# Patient Record
Sex: Male | Born: 1960
Health system: Southern US, Community
[De-identification: ages and names within clinical notes are randomized; demographics above are authoritative.]

## PROBLEM LIST (undated history)

## (undated) DIAGNOSIS — R569 Unspecified convulsions: Secondary | ICD-10-CM

## (undated) DIAGNOSIS — I10 Essential (primary) hypertension: Secondary | ICD-10-CM

## (undated) DIAGNOSIS — F319 Bipolar disorder, unspecified: Secondary | ICD-10-CM

## (undated) DIAGNOSIS — F1010 Alcohol abuse, uncomplicated: Secondary | ICD-10-CM

---

## 2000-06-17 ENCOUNTER — Emergency Department (HOSPITAL_COMMUNITY): Admission: EM | Admit: 2000-06-17 | Discharge: 2000-06-17 | Payer: Self-pay

## 2000-06-17 ENCOUNTER — Encounter: Payer: Self-pay | Admitting: Emergency Medicine

## 2003-01-10 ENCOUNTER — Encounter: Payer: Self-pay | Admitting: Emergency Medicine

## 2003-01-10 ENCOUNTER — Emergency Department (HOSPITAL_COMMUNITY): Admission: EM | Admit: 2003-01-10 | Discharge: 2003-01-10 | Payer: Self-pay | Admitting: Emergency Medicine

## 2003-05-10 ENCOUNTER — Emergency Department (HOSPITAL_COMMUNITY): Admission: EM | Admit: 2003-05-10 | Discharge: 2003-05-10 | Payer: Self-pay | Admitting: Emergency Medicine

## 2003-06-30 ENCOUNTER — Emergency Department (HOSPITAL_COMMUNITY): Admission: EM | Admit: 2003-06-30 | Discharge: 2003-06-30 | Payer: Self-pay | Admitting: Emergency Medicine

## 2006-12-23 ENCOUNTER — Emergency Department (HOSPITAL_COMMUNITY): Admission: AC | Admit: 2006-12-23 | Discharge: 2006-12-24 | Payer: Self-pay
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
## 2007-11-27 ENCOUNTER — Emergency Department (HOSPITAL_COMMUNITY): Admission: EM | Admit: 2007-11-27 | Discharge: 2007-11-27 | Payer: Self-pay | Admitting: Emergency Medicine

## 2010-06-21 ENCOUNTER — Emergency Department (HOSPITAL_COMMUNITY)
Admission: EM | Admit: 2010-06-21 | Discharge: 2010-06-21 | Payer: Self-pay | Source: Home / Self Care | Admitting: Emergency Medicine

## 2011-05-10 LAB — RAPID URINE DRUG SCREEN, HOSP PERFORMED
Amphetamines: NOT DETECTED
Barbiturates: NOT DETECTED
Benzodiazepines: NOT DETECTED
Cocaine: NOT DETECTED
Opiates: NOT DETECTED
Tetrahydrocannabinol: NOT DETECTED

## 2011-05-10 LAB — I-STAT 8, (EC8 V) (CONVERTED LAB)
Acid-base deficit: 1
BUN: 8
Bicarbonate: 22.9
Chloride: 103
Glucose, Bld: 85
HCT: 45
Hemoglobin: 15.3
Operator id: 151321
Potassium: 3.7
Sodium: 136
TCO2: 24
pCO2, Ven: 35.6 — ABNORMAL LOW
pH, Ven: 7.415 — ABNORMAL HIGH

## 2011-05-10 LAB — POCT I-STAT CREATININE
Creatinine, Ser: 1.2
Operator id: 151321

## 2011-05-10 LAB — ETHANOL: Alcohol, Ethyl (B): 240 — ABNORMAL HIGH

## 2016-05-08 ENCOUNTER — Encounter (HOSPITAL_COMMUNITY): Payer: Self-pay | Admitting: Emergency Medicine

## 2016-05-08 ENCOUNTER — Emergency Department (HOSPITAL_COMMUNITY)
Admission: EM | Admit: 2016-05-08 | Discharge: 2016-05-08 | Disposition: A | Discharge status: Home / Self Care | Payer: Self-pay | Attending: Emergency Medicine | Admitting: Emergency Medicine

## 2016-05-08 DIAGNOSIS — Y939 Activity, unspecified: Secondary | ICD-10-CM | POA: Insufficient documentation

## 2016-05-08 DIAGNOSIS — F17210 Nicotine dependence, cigarettes, uncomplicated: Secondary | ICD-10-CM | POA: Insufficient documentation

## 2016-05-08 DIAGNOSIS — Y929 Unspecified place or not applicable: Secondary | ICD-10-CM | POA: Insufficient documentation

## 2016-05-08 DIAGNOSIS — S46219A Strain of muscle, fascia and tendon of other parts of biceps, unspecified arm, initial encounter: Secondary | ICD-10-CM

## 2016-05-08 DIAGNOSIS — I10 Essential (primary) hypertension: Secondary | ICD-10-CM | POA: Insufficient documentation

## 2016-05-08 DIAGNOSIS — Y999 Unspecified external cause status: Secondary | ICD-10-CM | POA: Insufficient documentation

## 2016-05-08 DIAGNOSIS — X500XXA Overexertion from strenuous movement or load, initial encounter: Secondary | ICD-10-CM | POA: Insufficient documentation

## 2016-05-08 DIAGNOSIS — S46211A Strain of muscle, fascia and tendon of other parts of biceps, right arm, initial encounter: Secondary | ICD-10-CM | POA: Insufficient documentation

## 2016-05-08 HISTORY — DX: Unspecified convulsions: R56.9

## 2016-05-08 HISTORY — DX: Essential (primary) hypertension: I10

## 2016-05-08 LAB — CBC WITH DIFFERENTIAL/PLATELET
Basophils Absolute: 0 10*3/uL (ref 0.0–0.1)
Basophils Relative: 0 %
Eosinophils Absolute: 0.1 10*3/uL (ref 0.0–0.7)
Eosinophils Relative: 1 %
HCT: 44.6 % (ref 39.0–52.0)
Hemoglobin: 15.7 g/dL (ref 13.0–17.0)
Lymphocytes Relative: 20 %
Lymphs Abs: 2.6 10*3/uL (ref 0.7–4.0)
MCH: 31.4 pg (ref 26.0–34.0)
MCHC: 35.2 g/dL (ref 30.0–36.0)
MCV: 89.2 fL (ref 78.0–100.0)
Monocytes Absolute: 0.8 10*3/uL (ref 0.1–1.0)
Monocytes Relative: 6 %
Neutro Abs: 9.2 10*3/uL — ABNORMAL HIGH (ref 1.7–7.7)
Neutrophils Relative %: 73 %
Platelets: 245 10*3/uL (ref 150–400)
RBC: 5 MIL/uL (ref 4.22–5.81)
RDW: 11.8 % (ref 11.5–15.5)
WBC: 12.6 10*3/uL — ABNORMAL HIGH (ref 4.0–10.5)

## 2016-05-08 LAB — BASIC METABOLIC PANEL
Anion gap: 8 (ref 5–15)
BUN: 9 mg/dL (ref 6–20)
CO2: 27 mmol/L (ref 22–32)
Calcium: 9.6 mg/dL (ref 8.9–10.3)
Chloride: 102 mmol/L (ref 101–111)
Creatinine, Ser: 1.12 mg/dL (ref 0.61–1.24)
GFR calc Af Amer: >60 mL/min (ref >60)
GFR calc non Af Amer: >60 mL/min (ref >60)
Glucose, Bld: 103 mg/dL — ABNORMAL HIGH (ref 65–99)
Potassium: 3.9 mmol/L (ref 3.5–5.1)
Sodium: 137 mmol/L (ref 135–145)

## 2016-05-08 MED ORDER — OXYCODONE-ACETAMINOPHEN 5-325 MG PO TABS: 1.0000 | ORAL_TABLET | Freq: Four times a day (QID) | ORAL | 0 refills | Status: DC | PRN

## 2016-05-08 MED ORDER — FENTANYL CITRATE (PF) 100 MCG/2ML IJ SOLN
50.0000 ug | Freq: Once | INTRAMUSCULAR | Status: AC
  Administered 2016-05-08: 50 ug via INTRAVENOUS
  Filled 2016-05-08: qty 2

## 2016-05-08 NOTE — ED Provider Notes (Signed)
[facility]EMERGENCY DEPT Provider Note   CSN: [id number] Arrival date & time: 05/08/16  1033     History   Chief Complaint Chief Complaint  Patient presents with  . R arm pain    HPI Jerry Humphrey is a 55 y.o. male.  55 year old male reporting onset of right upper arm pain about 2 weeks ago while lifting/moving chunks of concrete. He states he heard a "pop", but was able to continue to use his arm. Yesterday, while moving a ladder, he developed worsening pain, and now is unable to flex/extend his arm.   The history is provided by the patient. No language interpreter was used.  Arm Injury   This is a new problem. The current episode started more than 1 week ago. The problem occurs constantly. The problem has been gradually worsening. The pain is present in the right arm. The quality of the pain is described as aching and constant. The pain is moderate. Associated symptoms include limited range of motion. The symptoms are aggravated by activity. He has tried rest for the symptoms.    Past Medical History:  Diagnosis Date  . Hypertension   . Seizures (HCC)     There are no active problems to display for this patient.   History reviewed. No pertinent surgical history.     Home Medications    Prior to Admission medications   Not on File    Family History No family history on file.  Social History Social History  Substance Use Topics  . Smoking status: Current Every Day Smoker    Packs/day: 1.00    Types: Cigarettes  . Smokeless tobacco: Not on file  . Alcohol use Yes     Comment: socially     Allergies   Codeine   Review of Systems Review of Systems  Musculoskeletal: Positive for myalgias.  All other systems reviewed and are negative.    Physical Exam Updated Vital Signs BP (!) 160/115 (BP Location: Left Arm)   Pulse 78   Temp 98 F (36.7 C) (Oral)   Resp 18   SpO2 99%   Physical Exam  Constitutional: He is oriented to person, place, and  time. He appears well-developed and well-nourished.  HENT:  Head: Normocephalic.  Eyes: Conjunctivae are normal.  Neck: Neck supple.  Cardiovascular: Normal rate and regular rhythm.   Pulmonary/Chest: Effort normal and breath sounds normal.  Abdominal: Soft. Bowel sounds are normal.  Musculoskeletal: He exhibits tenderness.       Right elbow: He exhibits decreased range of motion and swelling. Tenderness found.       Arms: Neurological: He is alert and oriented to person, place, and time.  Skin: Skin is warm and dry.  Psychiatric: He has a normal mood and affect.  Nursing note and vitals reviewed.    ED Treatments / Results  Labs (all labs ordered are listed, but only abnormal results are displayed) Labs Reviewed  CBC WITH DIFFERENTIAL/PLATELET  BASIC METABOLIC PANEL    EKG  EKG Interpretation None       Radiology No results found.  Procedures Procedures (including critical care time)  Medications Ordered in ED Medications  fentaNYL (SUBLIMAZE) injection 50 mcg (50 mcg Intravenous Given 05/08/16 1257)     Initial Impression / Assessment and Plan / ED Course  I have reviewed the triage vital signs and the nursing notes.  Pertinent labs & imaging results that were available during my care of the patient were reviewed by me and  considered in my medical decision making (see chart for details).  Clinical Course  Patient with biceps tendon tear. Discussed with orthopedics, who will follow-up with patient in the office. Splint, pain medication. Care instructions provided. Return precautions discussed.    Final Clinical Impressions(s) / ED Diagnoses   Final diagnoses:  Biceps tendon tear    New Prescriptions Discharge Medication List as of 05/08/2016  2:54 PM    START taking these medications   Details  oxyCODONE-acetaminophen (PERCOCET/ROXICET) 5-325 MG tablet Take 1 tablet by mouth every 6 (six) hours as needed for severe pain., Starting Tue 05/08/2016,  Print         Felicie Morn, NP 05/08/16 2317    Benjiman Core, MD 05/10/16 [id number]
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
## 2016-05-08 NOTE — ED Notes (Signed)
Papers reviewed with prescription and sling applied. Pt. verbalizes understanding of follow up instructions and medications and expresses understanding of sling

## 2016-05-09 ENCOUNTER — Telehealth (HOSPITAL_BASED_OUTPATIENT_CLINIC_OR_DEPARTMENT_OTHER): Payer: Self-pay | Admitting: Emergency Medicine

## 2019-01-18 ENCOUNTER — Emergency Department (HOSPITAL_COMMUNITY)
Admission: EM | Admit: 2019-01-18 | Discharge: 2019-01-18 | Disposition: A | Discharge status: Home / Self Care | Payer: Self-pay | Attending: Emergency Medicine | Admitting: Emergency Medicine

## 2019-01-18 ENCOUNTER — Emergency Department (HOSPITAL_COMMUNITY): Payer: Self-pay

## 2019-01-18 ENCOUNTER — Other Ambulatory Visit: Payer: Self-pay

## 2019-01-18 ENCOUNTER — Encounter (HOSPITAL_COMMUNITY): Payer: Self-pay

## 2019-01-18 DIAGNOSIS — S0281XA Fracture of other specified skull and facial bones, right side, initial encounter for closed fracture: Secondary | ICD-10-CM | POA: Insufficient documentation

## 2019-01-18 DIAGNOSIS — Y999 Unspecified external cause status: Secondary | ICD-10-CM | POA: Insufficient documentation

## 2019-01-18 DIAGNOSIS — S0240EA Zygomatic fracture, right side, initial encounter for closed fracture: Secondary | ICD-10-CM | POA: Insufficient documentation

## 2019-01-18 DIAGNOSIS — S02402A Zygomatic fracture, unspecified, initial encounter for closed fracture: Secondary | ICD-10-CM

## 2019-01-18 DIAGNOSIS — Y9389 Activity, other specified: Secondary | ICD-10-CM | POA: Insufficient documentation

## 2019-01-18 DIAGNOSIS — S0181XA Laceration without foreign body of other part of head, initial encounter: Secondary | ICD-10-CM | POA: Insufficient documentation

## 2019-01-18 DIAGNOSIS — F1099 Alcohol use, unspecified with unspecified alcohol-induced disorder: Secondary | ICD-10-CM | POA: Insufficient documentation

## 2019-01-18 DIAGNOSIS — F17210 Nicotine dependence, cigarettes, uncomplicated: Secondary | ICD-10-CM | POA: Insufficient documentation

## 2019-01-18 DIAGNOSIS — I10 Essential (primary) hypertension: Secondary | ICD-10-CM | POA: Insufficient documentation

## 2019-01-18 DIAGNOSIS — S0240CA Maxillary fracture, right side, initial encounter for closed fracture: Secondary | ICD-10-CM | POA: Insufficient documentation

## 2019-01-18 DIAGNOSIS — Y929 Unspecified place or not applicable: Secondary | ICD-10-CM | POA: Insufficient documentation

## 2019-01-18 DIAGNOSIS — Z79899 Other long term (current) drug therapy: Secondary | ICD-10-CM | POA: Insufficient documentation

## 2019-01-18 DIAGNOSIS — Z23 Encounter for immunization: Secondary | ICD-10-CM | POA: Insufficient documentation
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
## 2019-01-18 MED ORDER — TETANUS-DIPHTH-ACELL PERTUSSIS 5-2.5-18.5 LF-MCG/0.5 IM SUSP
0.5000 mL | Freq: Once | INTRAMUSCULAR | Status: AC
  Administered 2019-01-18: 08:00:00 0.5 mL via INTRAMUSCULAR
  Filled 2019-01-18: qty 0.5

## 2019-01-18 MED ORDER — TETRACAINE HCL 0.5 % OP SOLN
2.0000 [drp] | Freq: Once | OPHTHALMIC | Status: AC
  Administered 2019-01-18: 2 [drp] via OPHTHALMIC
  Filled 2019-01-18: qty 4

## 2019-01-18 MED ORDER — HYDROCODONE-ACETAMINOPHEN 5-325 MG PO TABS: 1.0000 | ORAL_TABLET | ORAL | 0 refills | Status: DC | PRN

## 2019-01-18 MED ORDER — FLUORESCEIN SODIUM 1 MG OP STRP
1.0000 | ORAL_STRIP | Freq: Once | OPHTHALMIC | Status: AC
  Administered 2019-01-18: 1 via OPHTHALMIC
  Filled 2019-01-18: qty 1

## 2019-01-18 MED ORDER — LIDOCAINE-EPINEPHRINE (PF) 2 %-1:200000 IJ SOLN
10.0000 mL | Freq: Once | INTRAMUSCULAR | Status: AC
  Administered 2019-01-18: 10 mL
  Filled 2019-01-18: qty 20

## 2019-01-18 NOTE — ED Notes (Signed)
Patient complaining of dizziness while talking to this RN. States he cannot see very well out of his left eye. Contacted his brother and he said he can pick him up in about an hour.

## 2019-01-18 NOTE — ED Notes (Signed)
Patient transported to CT 

## 2019-01-18 NOTE — ED Provider Notes (Signed)
..  Laceration Repair  Date/Time: 01/18/2019 7:58 AM Performed by: Margarita Mail, PA-C Authorized by: Margarita Mail, PA-C   Consent:    Consent obtained:  Verbal   Consent given by:  Patient   Risks discussed:  Infection, need for additional repair, pain, poor cosmetic result and poor wound healing   Alternatives discussed:  No treatment and delayed treatment Universal protocol:    Procedure explained and questions answered to patient or proxy's satisfaction: yes     Relevant documents present and verified: yes     Test results available and properly labeled: yes     Imaging studies available: yes     Required blood products, implants, devices, and special equipment available: yes     Site/side marked: yes     Immediately prior to procedure, a time out was called: yes     Patient identity confirmed:  Verbally with patient Anesthesia (see MAR for exact dosages):    Anesthesia method:  Local infiltration   Local anesthetic:  Lidocaine 1% WITH epi Laceration details:    Location:  Face   Face location:  Forehead   Length (cm):  3   Depth (mm):  5 Repair type:    Repair type:  Simple Pre-procedure details:    Preparation:  Patient was prepped and draped in usual sterile fashion Exploration:    Hemostasis achieved with:  Epinephrine   Wound exploration: wound explored through full range of motion and entire depth of wound probed and visualized     Contaminated: no   Treatment:    Area cleansed with:  Betadine   Amount of cleaning:  Standard   Irrigation solution:  Sterile saline Skin repair:    Repair method:  Sutures   Suture size:  4-0   Suture material:  Chromic gut   Suture technique:  Simple interrupted   Number of sutures:  2 Approximation:    Approximation:  Close Post-procedure details:    Dressing:  Open (no dressing)   Patient tolerance of procedure:  Tolerated well, no immediate complications      Margarita Mail, PA-C 01/18/19 0800    Isla Pence, MD 01/18/19 [phone]

## 2019-01-18 NOTE — ED Notes (Signed)
Pt. Is in bed at this time resting.
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
## 2019-01-30 ENCOUNTER — Inpatient Hospital Stay (HOSPITAL_COMMUNITY)
Admission: EM | Admit: 2019-01-30 | Discharge: 2019-02-16 | DRG: 494 | Disposition: A | Discharge status: Home Health | Payer: Self-pay | Attending: Internal Medicine | Admitting: Internal Medicine

## 2019-01-30 ENCOUNTER — Emergency Department (HOSPITAL_COMMUNITY): Payer: Self-pay

## 2019-01-30 ENCOUNTER — Other Ambulatory Visit: Payer: Self-pay

## 2019-01-30 ENCOUNTER — Encounter (HOSPITAL_COMMUNITY): Payer: Self-pay | Admitting: Emergency Medicine

## 2019-01-30 DIAGNOSIS — F1010 Alcohol abuse, uncomplicated: Secondary | ICD-10-CM | POA: Diagnosis present

## 2019-01-30 DIAGNOSIS — F17200 Nicotine dependence, unspecified, uncomplicated: Secondary | ICD-10-CM | POA: Diagnosis present

## 2019-01-30 DIAGNOSIS — R0789 Other chest pain: Secondary | ICD-10-CM

## 2019-01-30 DIAGNOSIS — S82409A Unspecified fracture of shaft of unspecified fibula, initial encounter for closed fracture: Secondary | ICD-10-CM | POA: Diagnosis present

## 2019-01-30 DIAGNOSIS — S82209A Unspecified fracture of shaft of unspecified tibia, initial encounter for closed fracture: Secondary | ICD-10-CM | POA: Diagnosis present

## 2019-01-30 DIAGNOSIS — F10229 Alcohol dependence with intoxication, unspecified: Secondary | ICD-10-CM | POA: Diagnosis present

## 2019-01-30 DIAGNOSIS — R079 Chest pain, unspecified: Secondary | ICD-10-CM | POA: Diagnosis present

## 2019-01-30 DIAGNOSIS — K219 Gastro-esophageal reflux disease without esophagitis: Secondary | ICD-10-CM | POA: Diagnosis present

## 2019-01-30 DIAGNOSIS — D72828 Other elevated white blood cell count: Secondary | ICD-10-CM | POA: Diagnosis present

## 2019-01-30 DIAGNOSIS — G40909 Epilepsy, unspecified, not intractable, without status epilepticus: Secondary | ICD-10-CM

## 2019-01-30 DIAGNOSIS — Z590 Homelessness: Secondary | ICD-10-CM

## 2019-01-30 DIAGNOSIS — S82201A Unspecified fracture of shaft of right tibia, initial encounter for closed fracture: Secondary | ICD-10-CM | POA: Diagnosis present

## 2019-01-30 DIAGNOSIS — S82301A Unspecified fracture of lower end of right tibia, initial encounter for closed fracture: Principal | ICD-10-CM

## 2019-01-30 DIAGNOSIS — I10 Essential (primary) hypertension: Secondary | ICD-10-CM | POA: Diagnosis present

## 2019-01-30 DIAGNOSIS — W010XXA Fall on same level from slipping, tripping and stumbling without subsequent striking against object, initial encounter: Secondary | ICD-10-CM | POA: Diagnosis present

## 2019-01-30 DIAGNOSIS — E876 Hypokalemia: Secondary | ICD-10-CM | POA: Diagnosis present

## 2019-01-30 DIAGNOSIS — F17210 Nicotine dependence, cigarettes, uncomplicated: Secondary | ICD-10-CM | POA: Diagnosis present

## 2019-01-30 DIAGNOSIS — S82401A Unspecified fracture of shaft of right fibula, initial encounter for closed fracture: Secondary | ICD-10-CM | POA: Diagnosis present

## 2019-01-30 DIAGNOSIS — Z20828 Contact with and (suspected) exposure to other viral communicable diseases: Secondary | ICD-10-CM | POA: Diagnosis present

## 2019-01-30 DIAGNOSIS — Z720 Tobacco use: Secondary | ICD-10-CM

## 2019-01-30 DIAGNOSIS — Y9301 Activity, walking, marching and hiking: Secondary | ICD-10-CM | POA: Diagnosis present

## 2019-01-30 DIAGNOSIS — D72829 Elevated white blood cell count, unspecified: Secondary | ICD-10-CM | POA: Diagnosis present

## 2019-01-30 DIAGNOSIS — S82831A Other fracture of upper and lower end of right fibula, initial encounter for closed fracture: Secondary | ICD-10-CM | POA: Diagnosis present

## 2019-01-30 DIAGNOSIS — R509 Fever, unspecified: Secondary | ICD-10-CM

## 2019-01-30 DIAGNOSIS — Z419 Encounter for procedure for purposes other than remedying health state, unspecified: Secondary | ICD-10-CM

## 2019-01-30 DIAGNOSIS — Z8673 Personal history of transient ischemic attack (TIA), and cerebral infarction without residual deficits: Secondary | ICD-10-CM

## 2019-01-30 DIAGNOSIS — W19XXXA Unspecified fall, initial encounter: Secondary | ICD-10-CM

## 2019-01-30 LAB — CBC WITH DIFFERENTIAL/PLATELET
Abs Immature Granulocytes: 0.08 10*3/uL — ABNORMAL HIGH (ref 0.00–0.07)
Basophils Absolute: 0.1 10*3/uL (ref 0.0–0.1)
Basophils Relative: 0 %
Eosinophils Absolute: 0 10*3/uL (ref 0.0–0.5)
Eosinophils Relative: 0 %
HCT: 45.2 % (ref 39.0–52.0)
Hemoglobin: 15.1 g/dL (ref 13.0–17.0)
Immature Granulocytes: 1 %
Lymphocytes Relative: 18 %
Lymphs Abs: 2.5 10*3/uL (ref 0.7–4.0)
MCH: 31.3 pg (ref 26.0–34.0)
MCHC: 33.4 g/dL (ref 30.0–36.0)
MCV: 93.8 fL (ref 80.0–100.0)
Monocytes Absolute: 0.7 10*3/uL (ref 0.1–1.0)
Monocytes Relative: 5 %
Neutro Abs: 11 10*3/uL — ABNORMAL HIGH (ref 1.7–7.7)
Neutrophils Relative %: 76 %
Platelets: 297 10*3/uL (ref 150–400)
RBC: 4.82 MIL/uL (ref 4.22–5.81)
RDW: 12.4 % (ref 11.5–15.5)
WBC: 14.5 10*3/uL — ABNORMAL HIGH (ref 4.0–10.5)
nRBC: 0 % (ref 0.0–0.2)

## 2019-01-30 LAB — COMPREHENSIVE METABOLIC PANEL
ALT: 125 U/L — ABNORMAL HIGH (ref 0–44)
AST: 107 U/L — ABNORMAL HIGH (ref 15–41)
Albumin: 4.7 g/dL (ref 3.5–5.0)
Alkaline Phosphatase: 68 U/L (ref 38–126)
Anion gap: 13 (ref 5–15)
BUN: 13 mg/dL (ref 6–20)
CO2: 21 mmol/L — ABNORMAL LOW (ref 22–32)
Calcium: 9 mg/dL (ref 8.9–10.3)
Chloride: 103 mmol/L (ref 98–111)
Creatinine, Ser: 1.15 mg/dL (ref 0.61–1.24)
GFR calc Af Amer: >60 mL/min (ref >60)
GFR calc non Af Amer: >60 mL/min (ref >60)
Glucose, Bld: 76 mg/dL (ref 70–99)
Potassium: 4.8 mmol/L (ref 3.5–5.1)
Sodium: 137 mmol/L (ref 135–145)
Total Bilirubin: 0.8 mg/dL (ref 0.3–1.2)
Total Protein: 8.9 g/dL — ABNORMAL HIGH (ref 6.5–8.1)

## 2019-01-30 LAB — TYPE AND SCREEN
ABO/RH(D): O POS
Antibody Screen: NEGATIVE

## 2019-01-30 LAB — ETHANOL: Alcohol, Ethyl (B): 170 mg/dL — ABNORMAL HIGH (ref <10)

## 2019-01-30 LAB — SARS CORONAVIRUS 2 BY RT PCR (HOSPITAL ORDER, PERFORMED IN ~~LOC~~ HOSPITAL LAB): SARS Coronavirus 2: NEGATIVE

## 2019-01-30 LAB — TROPONIN I (HIGH SENSITIVITY)
Troponin I (High Sensitivity): 4 ng/L (ref <18)
Troponin I (High Sensitivity): 4 ng/L (ref <18)

## 2019-01-30 IMAGING — CR RIGHT ANKLE - COMPLETE 3+ VIEW
2 series · 2 of 2 positions shown · non-contrast
Comparison: None.

CLINICAL DATA: Pain after fall

EXAM:
RIGHT ANKLE - COMPLETE 3+ VIEW

[x ankle lat right]
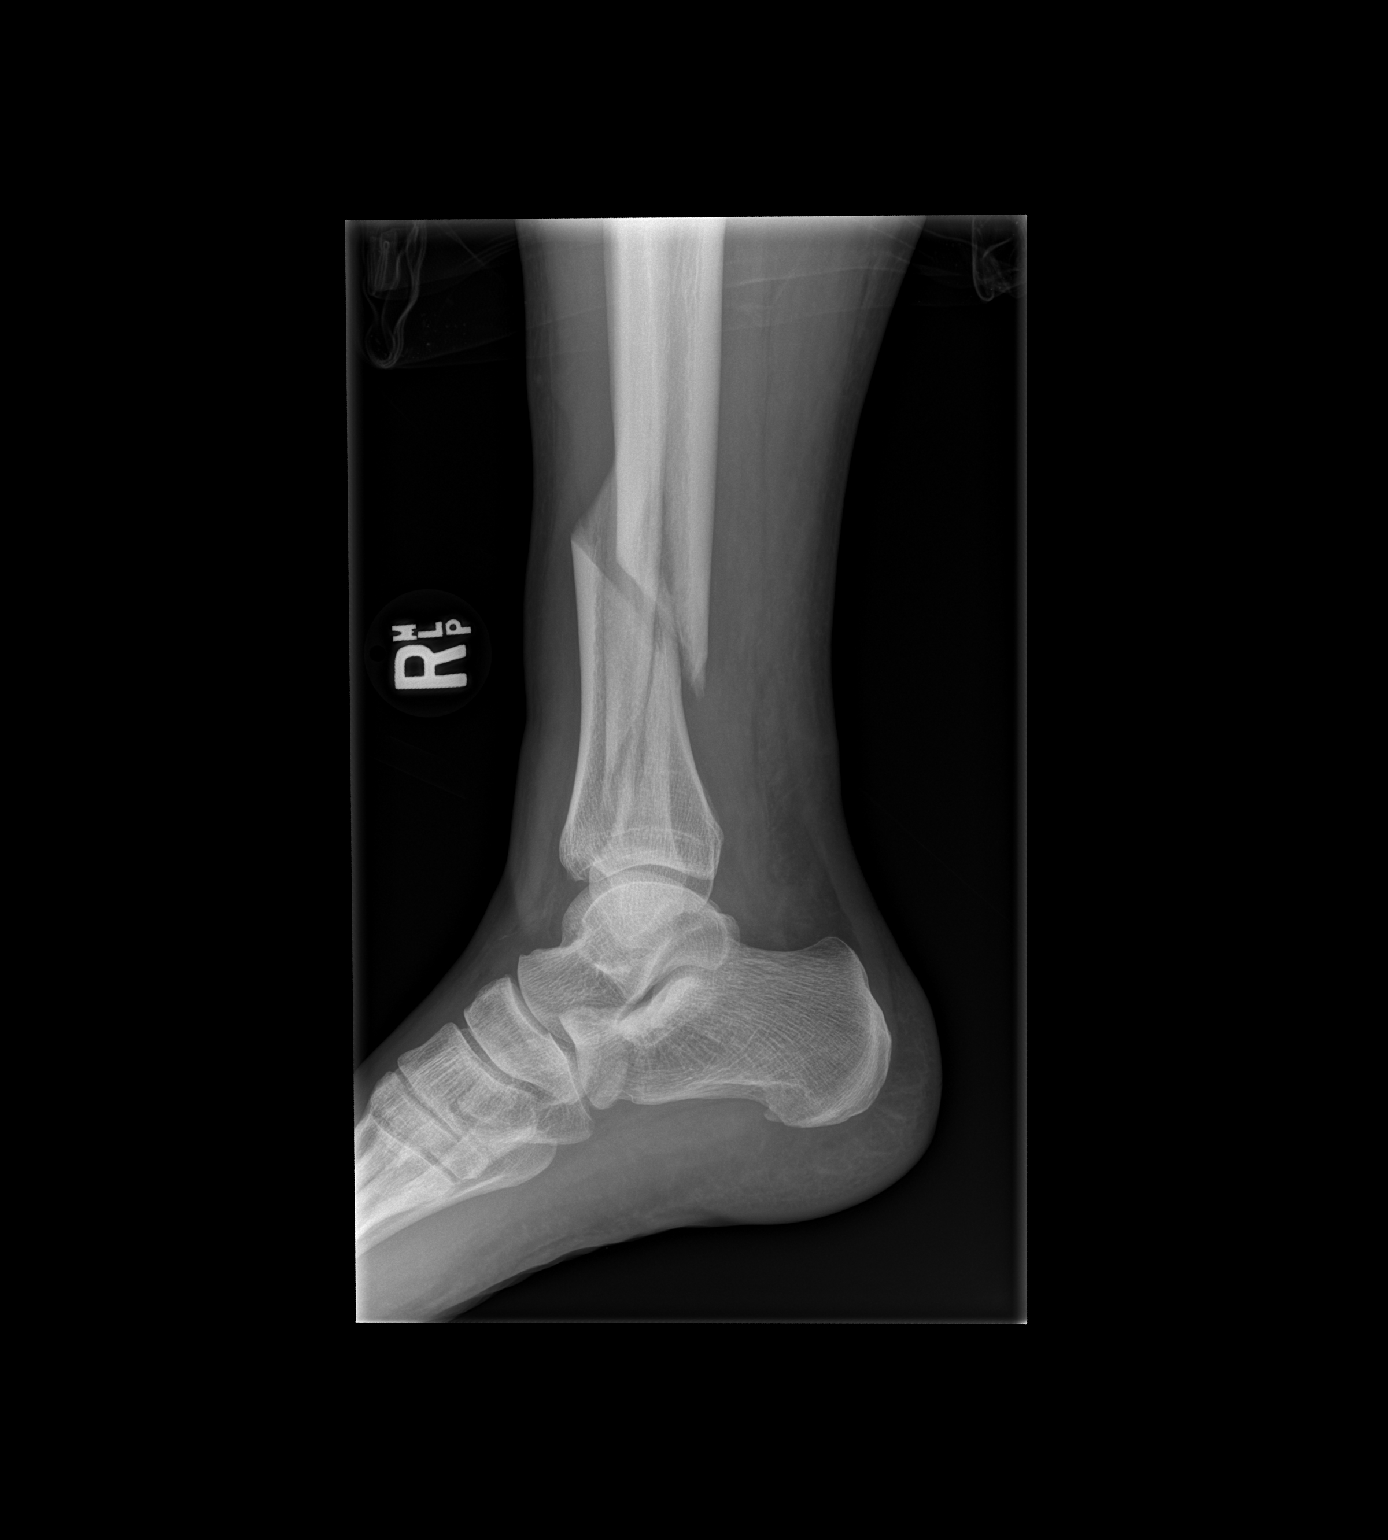

[x ankle ap right]
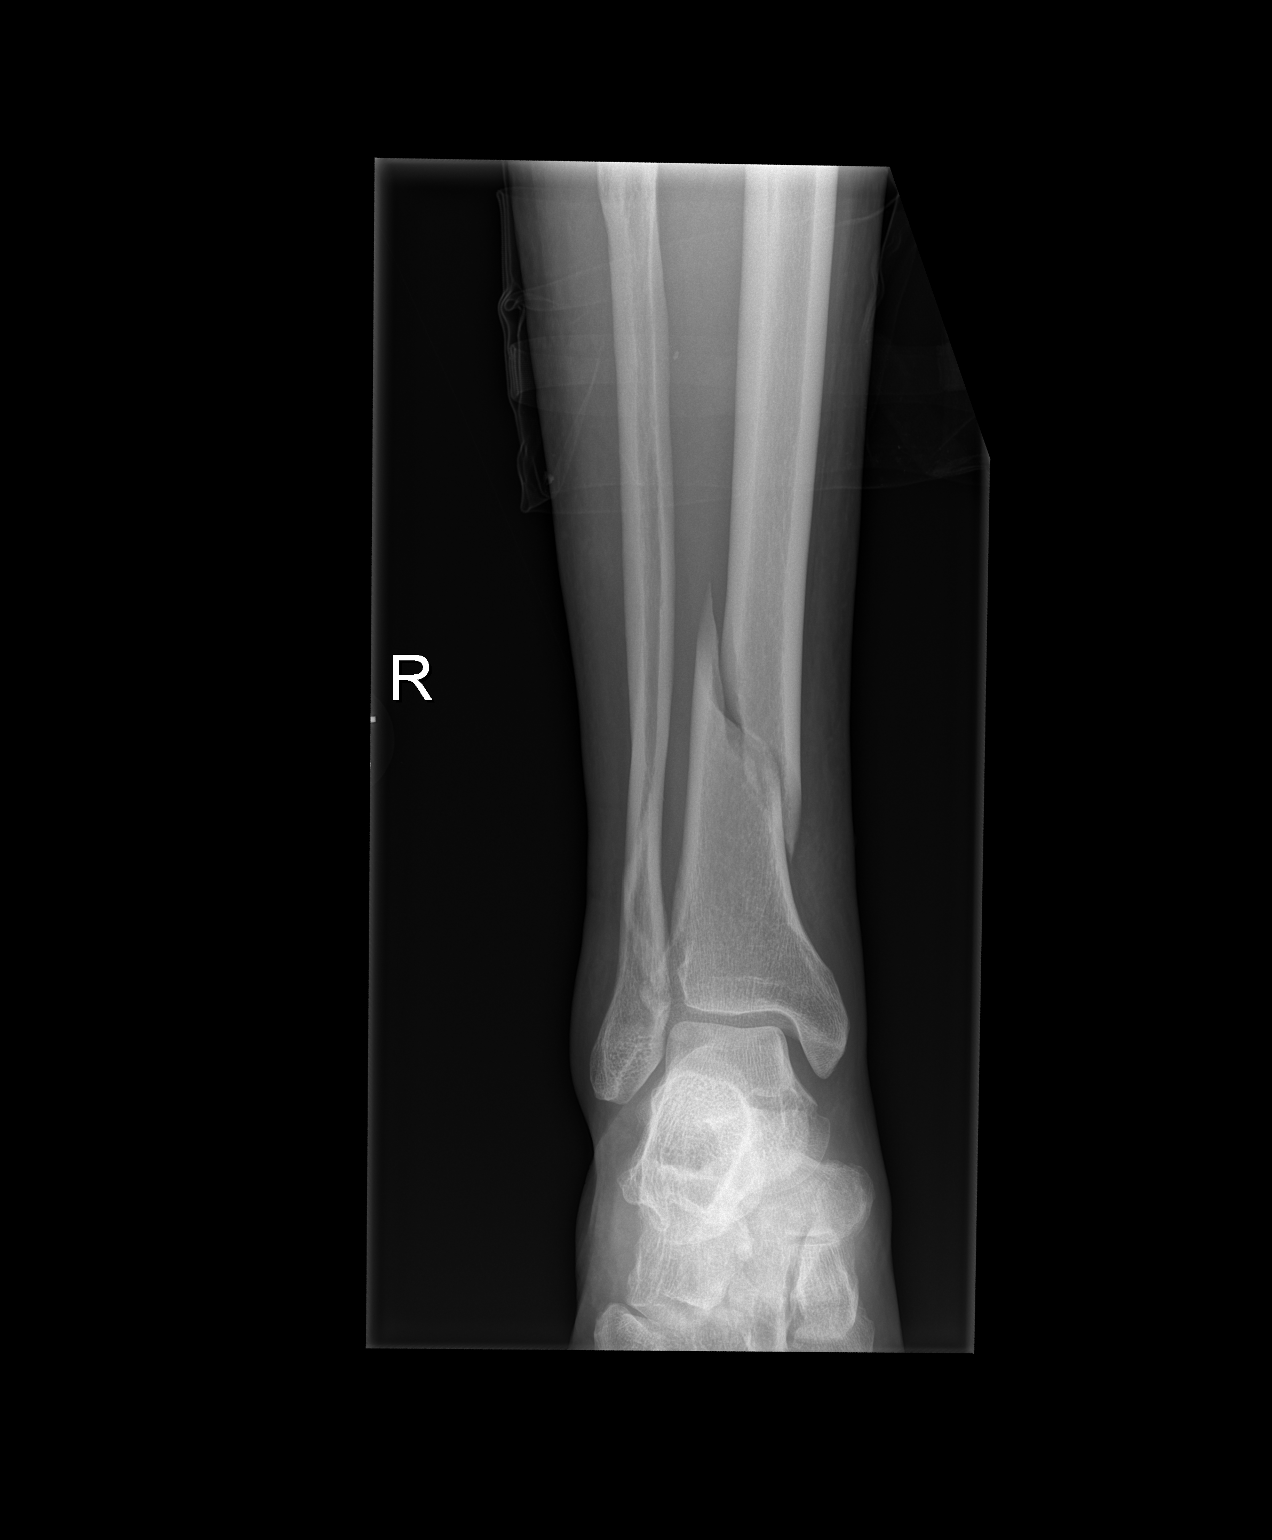

[2 of 2 positions shown; findings below may reference images not displayed]

FINDINGS: A fracture of the distal fibula demonstrates only mild displacement.
Moderate displacement is seen in the distal tibial diaphysis
fracture. The ankle mortise is intact. No other acute abnormalities
identified. Suspected coalition between the talus and calcaneus.
IMPRESSION: 1. Fractures of the distal fibula and tibia as described above.
2. Suspected coalition between the calcaneus and talus.

## 2019-01-30 IMAGING — DX PORTABLE CHEST - 1 VIEW
1 series · 1 of 1 positions shown · non-contrast
Comparison: Radiograph [DATE].

CLINICAL DATA: Fall, hypertension.

EXAM:
PORTABLE CHEST 1 VIEW

[chest ap]
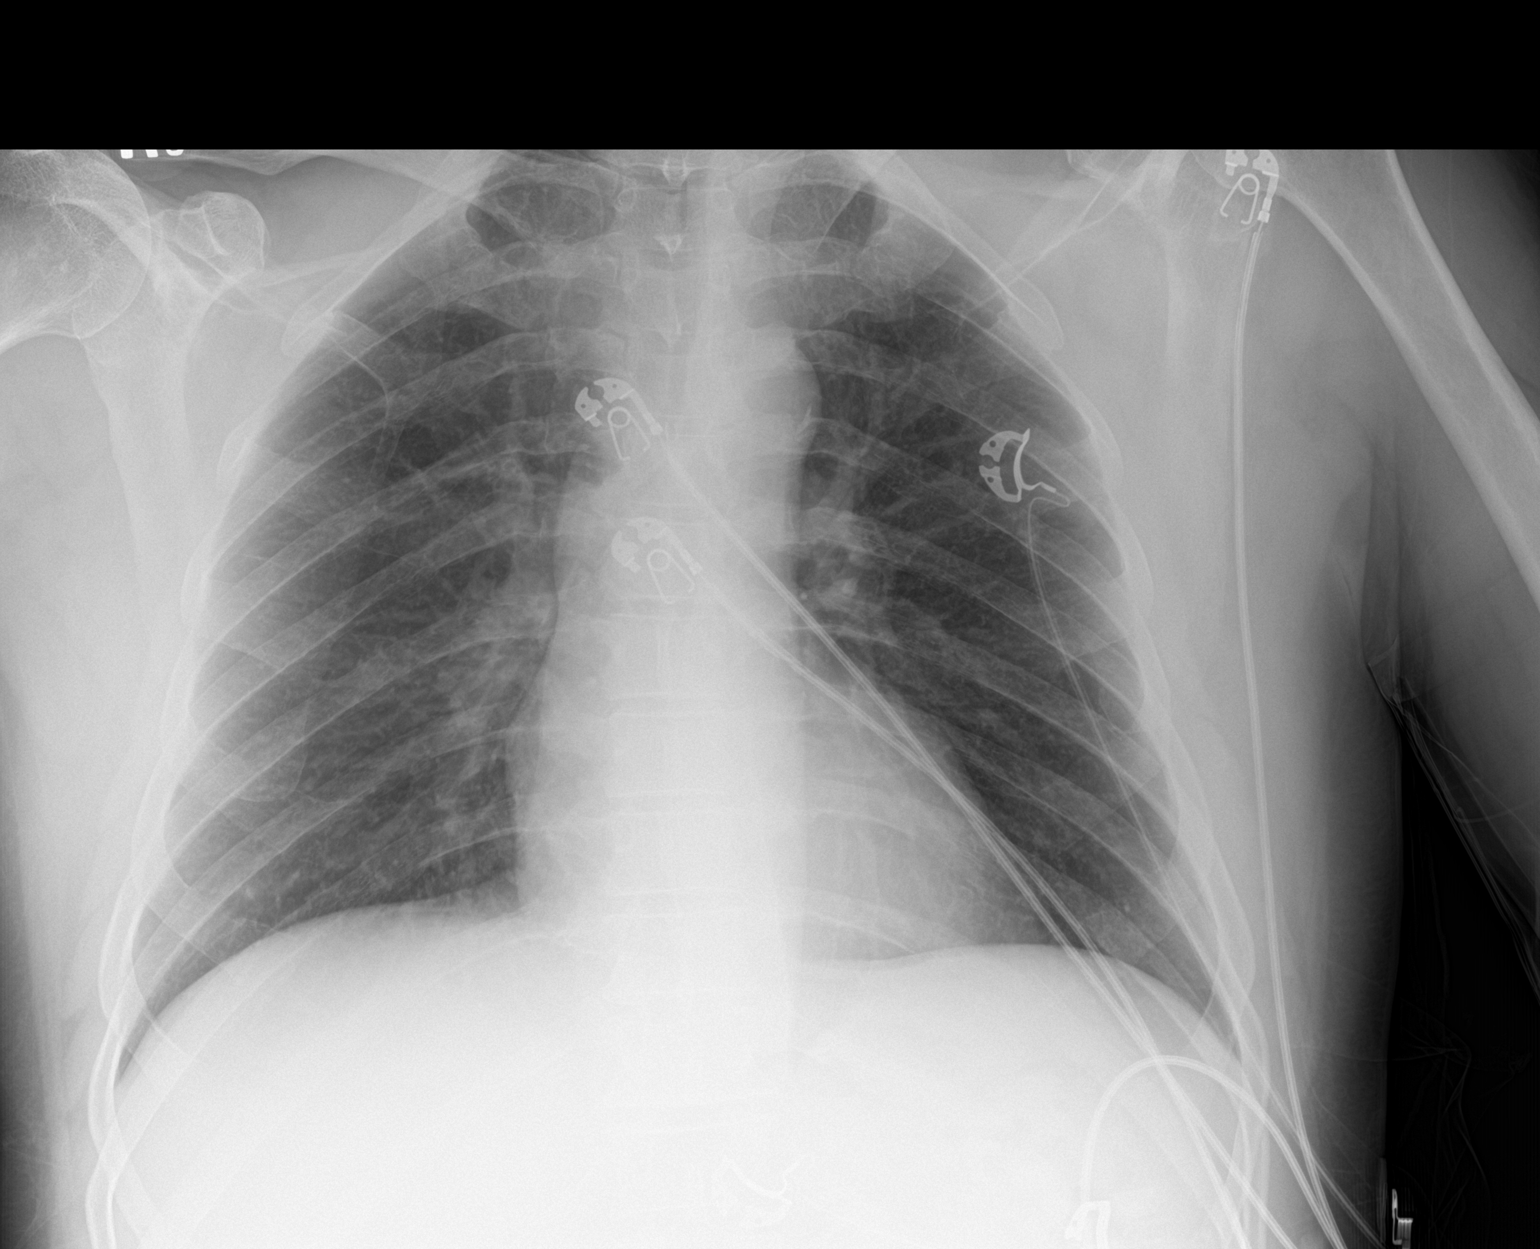

[1 of 1 positions shown; findings below may reference images not displayed]

FINDINGS: The heart size and mediastinal contours are within normal limits.
Both lungs are clear. No pneumothorax or pleural effusion is noted.
The visualized skeletal structures are unremarkable.
IMPRESSION: No active disease.

## 2019-01-30 MED ORDER — FOLIC ACID 1 MG PO TABS
1.0000 mg | ORAL_TABLET | Freq: Every day | ORAL | Status: DC
  Administered 2019-01-31 – 2019-02-16 (×16): 1 mg via ORAL
  Filled 2019-01-30 (×16): qty 1

## 2019-01-30 MED ORDER — LORAZEPAM 2 MG/ML IJ SOLN
0.0000 mg | Freq: Two times a day (BID) | INTRAMUSCULAR | Status: AC
  Administered 2019-02-02: 2 mg via INTRAVENOUS
  Administered 2019-02-02: 1 mg via INTRAVENOUS
  Filled 2019-01-30 (×2): qty 1

## 2019-01-30 MED ORDER — MORPHINE SULFATE (PF) 2 MG/ML IV SOLN
0.5000 mg | INTRAVENOUS | Status: DC | PRN
  Administered 2019-01-30 – 2019-01-31 (×5): 0.5 mg via INTRAVENOUS
  Filled 2019-01-30 (×5): qty 1

## 2019-01-30 MED ORDER — VITAMIN B-1 100 MG PO TABS
100.0000 mg | ORAL_TABLET | Freq: Every day | ORAL | Status: DC
  Administered 2019-01-31 – 2019-02-16 (×16): 100 mg via ORAL
  Filled 2019-01-30 (×17): qty 1

## 2019-01-30 MED ORDER — SENNA 8.6 MG PO TABS
1.0000 | ORAL_TABLET | Freq: Two times a day (BID) | ORAL | Status: DC
  Administered 2019-01-31 – 2019-02-16 (×30): 8.6 mg via ORAL
  Filled 2019-01-30 (×31): qty 1

## 2019-01-30 MED ORDER — NICOTINE 21 MG/24HR TD PT24
21.0000 mg | MEDICATED_PATCH | TRANSDERMAL | Status: DC
  Administered 2019-01-30 – 2019-02-15 (×17): 21 mg via TRANSDERMAL
  Filled 2019-01-30 (×17): qty 1

## 2019-01-30 MED ORDER — SUCRALFATE 1 GM/10ML PO SUSP
1.0000 g | Freq: Three times a day (TID) | ORAL | Status: DC
  Administered 2019-01-30 – 2019-02-16 (×63): 1 g via ORAL
  Filled 2019-01-30 (×64): qty 10

## 2019-01-30 MED ORDER — LORAZEPAM 2 MG/ML IJ SOLN
1.0000 mg | Freq: Four times a day (QID) | INTRAMUSCULAR | Status: AC | PRN
  Administered 2019-01-31: 1 mg via INTRAVENOUS
  Filled 2019-01-30 (×2): qty 1

## 2019-01-30 MED ORDER — METHOCARBAMOL 500 MG PO TABS
500.0000 mg | ORAL_TABLET | Freq: Four times a day (QID) | ORAL | Status: DC | PRN
  Administered 2019-02-01 – 2019-02-15 (×15): 500 mg via ORAL
  Filled 2019-01-30 (×16): qty 1

## 2019-01-30 MED ORDER — POLYETHYLENE GLYCOL 3350 17 G PO PACK
17.0000 g | PACK | Freq: Every day | ORAL | Status: DC | PRN
  Administered 2019-02-04: 17 g via ORAL
  Filled 2019-01-30: qty 1

## 2019-01-30 MED ORDER — FENTANYL CITRATE (PF) 100 MCG/2ML IJ SOLN: 50.0000 ug | Freq: Once | INTRAMUSCULAR | Status: DC | PRN

## 2019-01-30 MED ORDER — LORAZEPAM 1 MG PO TABS: 1.0000 mg | ORAL_TABLET | Freq: Four times a day (QID) | ORAL | Status: AC | PRN

## 2019-01-30 MED ORDER — HYDROCODONE-ACETAMINOPHEN 5-325 MG PO TABS: 1.0000 | ORAL_TABLET | Freq: Four times a day (QID) | ORAL | Status: DC | PRN

## 2019-01-30 MED ORDER — LORAZEPAM 2 MG/ML IJ SOLN
0.0000 mg | Freq: Four times a day (QID) | INTRAMUSCULAR | Status: AC
  Administered 2019-01-31: 1 mg via INTRAVENOUS
  Filled 2019-01-30: qty 1

## 2019-01-30 MED ORDER — FENTANYL CITRATE (PF) 100 MCG/2ML IJ SOLN
50.0000 ug | Freq: Once | INTRAMUSCULAR | Status: AC | PRN
  Administered 2019-01-30: 50 ug via INTRAVENOUS
  Filled 2019-01-30: qty 2

## 2019-01-30 MED ORDER — THIAMINE HCL 100 MG/ML IJ SOLN
100.0000 mg | Freq: Every day | INTRAMUSCULAR | Status: DC
  Administered 2019-01-30: 22:00:00 100 mg via INTRAVENOUS
  Filled 2019-01-30 (×3): qty 2

## 2019-01-30 MED ORDER — FENTANYL CITRATE (PF) 100 MCG/2ML IJ SOLN
50.0000 ug | Freq: Once | INTRAMUSCULAR | Status: AC
  Administered 2019-01-30: 20:00:00 50 ug via INTRAVENOUS
  Filled 2019-01-30: qty 2

## 2019-01-30 MED ORDER — PANTOPRAZOLE SODIUM 40 MG PO TBEC: 40.0000 mg | DELAYED_RELEASE_TABLET | Freq: Every day | ORAL | Status: DC

## 2019-01-30 MED ORDER — ADULT MULTIVITAMIN W/MINERALS CH
1.0000 | ORAL_TABLET | Freq: Every day | ORAL | Status: DC
  Administered 2019-01-31 – 2019-02-16 (×16): 1 via ORAL
  Filled 2019-01-30 (×17): qty 1

## 2019-01-30 MED ORDER — METHOCARBAMOL 1000 MG/10ML IJ SOLN
500.0000 mg | Freq: Four times a day (QID) | INTRAVENOUS | Status: DC | PRN
  Administered 2019-01-30: 500 mg via INTRAVENOUS
  Filled 2019-01-30: qty 5
  Filled 2019-01-30: qty 500

## 2019-01-30 NOTE — ED Notes (Signed)
Hospitalist at bedside 
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
## 2019-01-30 NOTE — ED Notes (Signed)
Ortho MD and ortho tech at bedside splinting at this time.

## 2019-01-30 NOTE — ED Provider Notes (Signed)
Care assumed from J. Ward PA-C.  Please see her full H&P.  In short,  Jerry Humphrey is a 58 y.o. male presents for right ankle pain.  After drinking heavily today he tripped over a tree stump.  Work-up by previous provider includes x-ray right ankle showing moderately to placed distal tibial diaphysis fracture as well as distal fibula fracture.  Patient has been evaluated by orthopedics Dr. Devonne DoughtyNoris recommends medical admission with Ortho consult with plan to take patient to the OR today if medically cleared. Troponin is negative, EKG without ischemic changes, chest x-ray negative, normal labs.   Will consult hospitalist for admission given patient had chest pain and history of alcohol withdrawal seizure.     Physical Exam  BP 124/88   Pulse 92   Temp 98.3 F (36.8 C)   Resp 18   SpO2 98%   Physical Exam PE: Constitutional: well-developed, well-nourished, no apparent distress HENT: normocephalic, atraumatic. no cervical adenopathy Cardiovascular: normal rate and rhythm, distal pulses intact Pulmonary/Chest: effort normal; breath sounds clear and equal bilaterally; no wheezes or rales.  Tenderness to palpation of the left ribs.  SpO2 is 96% on room air during exam. Abdominal: soft and nontender Musculoskeletal: Short leg splint on right lower extremity.  Sensation intact, patient can wiggle all toes.  Cap refill less than 2 seconds. Neurological: alert with goal directed thinking Skin: warm and dry, no rash, no diaphoresis Psychiatric: normal mood and affect, normal behavior    ED Course/Procedures   Results for orders placed or performed during the hospital encounter of 01/30/19 (from the past 24 hour(s))  CBC with Differential     Status: Abnormal   Collection Time: 01/30/19  3:26 PM  Result Value Ref Range   WBC 14.5 (H) 4.0 - 10.5 K/uL   RBC 4.82 4.22 - 5.81 MIL/uL   Hemoglobin 15.1 13.0 - 17.0 g/dL   HCT 16.145.2 09.639.0 - 04.552.0 %   MCV 93.8 80.0 - 100.0 fL   MCH 31.3 26.0 - 34.0  pg   MCHC 33.4 30.0 - 36.0 g/dL   RDW 40.912.4 81.111.5 - 91.415.5 %   Platelets 297 150 - 400 K/uL   nRBC 0.0 0.0 - 0.2 %   Neutrophils Relative % 76 %   Neutro Abs 11.0 (H) 1.7 - 7.7 K/uL   Lymphocytes Relative 18 %   Lymphs Abs 2.5 0.7 - 4.0 K/uL   Monocytes Relative 5 %   Monocytes Absolute 0.7 0.1 - 1.0 K/uL   Eosinophils Relative 0 %   Eosinophils Absolute 0.0 0.0 - 0.5 K/uL   Basophils Relative 0 %   Basophils Absolute 0.1 0.0 - 0.1 K/uL   Immature Granulocytes 1 %   Abs Immature Granulocytes 0.08 (H) 0.00 - 0.07 K/uL  Ethanol     Status: Abnormal   Collection Time: 01/30/19  3:26 PM  Result Value Ref Range   Alcohol, Ethyl (B) 170 (H) <10 mg/dL  Comprehensive metabolic panel     Status: Abnormal   Collection Time: 01/30/19  3:26 PM  Result Value Ref Range   Sodium 137 135 - 145 mmol/L   Potassium 4.8 3.5 - 5.1 mmol/L   Chloride 103 98 - 111 mmol/L   CO2 21 (L) 22 - 32 mmol/L   Glucose, Bld 76 70 - 99 mg/dL   BUN 13 6 - 20 mg/dL   Creatinine, Ser 7.821.15 0.61 - 1.24 mg/dL   Calcium 9.0 8.9 - 95.610.3 mg/dL   Total Protein 8.9 (H)  6.5 - 8.1 g/dL   Albumin 4.7 3.5 - 5.0 g/dL   AST 107 (H) 15 - 41 U/L   ALT 125 (H) 0 - 44 U/L   Alkaline Phosphatase 68 38 - 126 U/L   Total Bilirubin 0.8 0.3 - 1.2 mg/dL   GFR calc non Af Amer >60 >60 mL/min   GFR calc Af Amer >60 >60 mL/min   Anion gap 13 5 - 15  Troponin I (High Sensitivity)     Status: None   Collection Time: 01/30/19  3:42 PM  Result Value Ref Range   Troponin I (High Sensitivity) 4.0 <18 ng/L  SARS Coronavirus 2 (CEPHEID - Performed in [place] hospital lab), Hosp Order     Status: None   Collection Time: 01/30/19  4:00 PM   Specimen: Nasopharyngeal Swab  Result Value Ref Range   SARS Coronavirus 2 NEGATIVE NEGATIVE      MDM  58 year old male presents with right ankle pain after fall.  On arrival he was clinically intoxicated.  No signs of head injury on exam.  Neuro exam without focal deficit.  X-ray concerning for  moderately displaced distal tibial diaphysis fracture and distal fibula fracture.  Patient has been seen by orthopedist Dr. Alma Friendly. Splint on right lower extremity by ortho tech. Spoke with Dr. Roel Cluck with hospitalist service who agrees to assume care of patient and bring into the hospital for further evaluation and management.     This note was prepared using Dragon voice recognition software and may include unintentional dictation errors due to the inherent limitations of voice recognition software.        Cherre Robins, PA-C 01/30/19 1829    Maudie Flakes, MD 02/02/19 [phone]

## 2019-01-30 NOTE — ED Notes (Signed)
Patient requesting pain medication. No orders at this time. Dr. Roel Cluck made aware.

## 2019-01-30 NOTE — ED Triage Notes (Signed)
Per EMS, patient c/o right ankle pain after tripping over a root. Ice applied. Reports ETOH today.

## 2019-01-30 NOTE — ED Notes (Signed)
Ice pack applied.

## 2019-01-30 NOTE — ED Notes (Signed)
Patient transported to X-ray 

## 2019-01-30 NOTE — ED Provider Notes (Signed)
[facility]-EMERGENCY DEPT Provider Note   CSN: [id number] Arrival date & time: 01/30/19  1326    History   Chief Complaint Chief Complaint  Patient presents with  . Ankle Pain    HPI Jerry Humphrey is a 58 y.o. male.     The history is provided by the patient and medical records. No language interpreter was used.  Ankle Pain  Jerry Humphrey is a 58 y.o. male who presents to the Emergency Department complaining of acute onset of right ankle pain.  Patient endorses drinking heavily today.  He states that he was walking when he tripped over a tree stump causing acute onset of his pain.  He denies any head injury or any other complaints.  Denies any numbness.  Pain is worse with certain movements and he has not been able to ambulate on leg since injury at 10am this am.   Past Medical History:  Diagnosis Date  . Hypertension   . Seizures (HCC)     There are no active problems to display for this patient.   History reviewed. No pertinent surgical history.      Home Medications    Prior to Admission medications   Medication Sig Start Date End Date Taking? Authorizing Provider  HYDROcodone-acetaminophen (NORCO/VICODIN) 5-325 MG tablet Take 1 tablet by mouth every 4 (four) hours as needed. 01/18/19   Jacalyn LefevreHaviland, Julie, MD  oxyCODONE-acetaminophen (PERCOCET/ROXICET) 5-325 MG tablet Take 1 tablet by mouth every 6 (six) hours as needed for severe pain. Patient not taking: Reported on 01/18/2019 05/08/16   Felicie MornSmith, David, NP  PRESCRIPTION MEDICATION Take 1 tablet by mouth daily. Blood Pressure Medication    [provider]    Family History No family history on file.  Social History Social History   Tobacco Use  . Smoking status: Current Every Day Smoker    Packs/day: 0.50    Types: Cigarettes  . Smokeless tobacco: Never Used  Substance Use Topics  . Alcohol use: Yes    Comment: socially  . Drug use: Never     Allergies   Codeine   Review of Systems Review of Systems  Musculoskeletal: Positive for arthralgias and myalgias.  All other systems reviewed and are negative.    Physical Exam Updated Vital Signs BP (!) 145/72   Pulse 88   Temp 98.3 F (36.8 C)   Resp 20   SpO2 94%   Physical Exam Vitals signs and nursing note reviewed.  Constitutional:      General: He is not in acute distress.    Appearance: He is well-developed.  HENT:     Head: Normocephalic and atraumatic.  Neck:     Musculoskeletal: Neck supple.  Cardiovascular:     Rate and Rhythm: Normal rate and regular rhythm.     Heart sounds: Normal heart sounds. No murmur.  Pulmonary:     Effort: Pulmonary effort is normal. No respiratory distress.     Breath sounds: Normal breath sounds.     Comments: Tenderness to palpation of left ribs. Lungs clear to auscultation bilaterally.  Abdominal:     General: There is no distension.     Palpations: Abdomen is soft.     Tenderness: There is no abdominal tenderness.  Musculoskeletal:     Comments: Tenderness and swelling to right ankle. No open wounds. 2+ DP. Sensation intact.  Skin:    General: Skin is warm and dry.  Neurological:     General: No focal deficit present.  Mental Status: He is alert and oriented to person, place, and time.     Cranial Nerves: No cranial nerve deficit.      ED Treatments / Results  Labs (all labs ordered are listed, but only abnormal results are displayed) Labs Reviewed  CBC WITH DIFFERENTIAL/PLATELET - Abnormal; Notable for the following components:      Result Value   WBC 14.5 (*)    Neutro Abs 11.0 (*)    Abs Immature Granulocytes 0.08 (*)    All other components within normal limits  ETHANOL - Abnormal; Notable for the following components:   Alcohol, Ethyl (B) 170 (*)    All other components within normal limits  COMPREHENSIVE METABOLIC PANEL - Abnormal; Notable for the following components:   CO2 21 (*)    Total Protein 8.9 (*)    AST 107 (*)     ALT 125 (*)    All other components within normal limits  SARS CORONAVIRUS 2 ([facility]OSPITAL [facility])  TROPONIN I (HIGH SENSITIVITY)  TROPONIN I (HIGH SENSITIVITY)    EKG EKG Interpretation  Date/Time:  Friday January 30 2019 15:40:30 EDT Ventricular Rate:  76 PR Interval:    QRS Duration: 94 QT Interval:  385 QTC Calculation: 433 R Axis:   55 Text Interpretation:  Sinus rhythm Probable left atrial enlargement Minimal ST elevation, inferior leads Baseline wander in lead(s) II aVR No significant change since last tracing Confirmed by Frederick PeersLittle, Rachel [phone]([id number]) on 01/30/2019 3:43:53 PM   Radiology Dg Ankle Complete Right  Result Date: 01/30/2019 CLINICAL DATA:  Pain after fall EXAM: RIGHT ANKLE - COMPLETE 3+ VIEW COMPARISON:  None. FINDINGS: A fracture of the distal fibula demonstrates only mild displacement. Moderate displacement is seen in the distal tibial diaphysis fracture. The ankle mortise is intact. No other acute abnormalities identified. 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 IMPRESSION: 1. Fractures of the distal fibula and tibia as described above. 2. 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 Electronically Signed   By: Gerome Samavid  [name] III M.D   On: 01/30/2019 14:33   Dg Chest Port 1 View  Result Date: 01/30/2019 CLINICAL DATA:  Fall, hypertension. EXAM: PORTABLE CHEST 1 VIEW COMPARISON:  Radiograph of January 26, 2016. FINDINGS: The heart size and mediastinal contours are within normal limits. Both lungs are clear. No pneumothorax or pleural effusion is noted. The visualized skeletal structures are unremarkable. IMPRESSION: No active disease. Electronically Signed   By: Lupita RaiderJames  Green Jr M.D.   On: 01/30/2019 16:35    Procedures Procedures (including critical care time)  Medications Ordered in ED Medications - No data to display   Initial Impression / Assessment and Plan / ED Course  I have reviewed the triage vital signs  and the nursing notes.  Pertinent labs & imaging results that were available during my care of the patient were reviewed by me and considered in my medical decision making (see chart for details).       Jerry Humphrey is a 58 y.o. male who presents to ED for acute onset of right lower extremity pain after tripping over a tree stump just prior to arrival.  Clinically intoxicated.  He denies any head injury there is no sign of head injury on exam.  He has no focal cranial nerve deficits.  Right lower extremity is neurovascularly intact, but exam concerning for fracture.  Compartments are soft.  X-ray does show moderately displaced distal tibial diaphysis fracture as well as distal  fibula fracture.  Orthopedics, Dr. Alma Friendly, was consulted and evaluated the patient.  While he was examining patient, he started complaining of chest pain.  I then came into the room and reevaluated him.  He was tender to palpation of the left upper rib cage, but otherwise had a normal cardiopulmonary exam.  An EKG was done that was unchanged from previous. CXR normal. Given his hx of seizures, reported chest pain, ortho recommending medical admission with ortho consultation. Trop and covid pending at shift change. Care assumed by oncoming provider, PA Albrizze who will follow up on pending troponin and consult hospitalist for admission.   Patient seen by and discussed with Dr. Rex Kras who agrees with treatment plan.    Final Clinical Impressions(s) / ED Diagnoses   Final diagnoses:  Fall  Closed fracture of distal end of right tibia, unspecified fracture morphology, initial encounter    ED Discharge Orders    None       Ward, Ozella Almond, PA-C 01/30/19 [place]    Little, Wenda Overland, MD 02/08/19 2032

## 2019-01-30 NOTE — H&P (Signed)
Jerry Humphrey:[id number] DOB: [birth year] DOA: 01/30/2019     PCP: Patient, [name]   Outpatient Specialists:   NONE    Patient arrived to ER on 01/30/19 at 1326  Patient coming from: home Lives alone,      Chief Complaint:  Chief Complaint  Patient presents with  . Ankle Pain    HPI: Jerry Humphrey is a 58 y.o. male with medical history significant of ETOH use,   Seizure Do not on any medicaitons, HTN, tobacco abuse    Presented with   Fall due to stumbling over a tree stump with ankle deformity. Was unable to tolerate the weight on the foot when he fell down. Patient reported that he has occasional chest pains which he attributes to heartburn but also occasionally when he gets stressed out he gets chest discomfort occasionally he feels also short of breath but he attributes all this to getting angry with his coworkers. When asked how often  it happens he says practically every day. Denies any history of coronary artery disease that he knows of he continues to smoke  Reported CP on the left while in ER reports burning-like sensation on the left side there was some reproducible chest pain with palpation.  Unsure if it was related to the fall. 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 Pt denies any head injury no LOC States he did have couple beers earlier today  Infectious risk factors:  Reports  shortness of breath  In  ER RAPID COVID TEST NEGATIVE     Regarding pertinent Chronic problems:      HTN   not on any meds  states he has history of seizures when he gets stressed out they get worse but he does not want to go to a neurologist to take any medications for it he just feels like he needs to control his stress Seems like a last  seizure was more than 4 months ago  While in ER:  Found to have fracture of distal fibula and tibia on the right and orthopedics has been consulted  The following Work up has been ordered so far:  Orders Placed This Encounter  Procedures  . SARS Coronavirus 2 (CEPHEID - Performed in [facility] lab), [facility]  . DG Ankle Complete Right  . DG Chest Port 1 View  . CBC with Differential  . Ethanol  . Comprehensive metabolic panel  . Diet NPO time specified  . Apply other splint  . Clinical institute withdrawal assessment  . Cardiac monitoring  . Consult to orthopedic surgery  ALL PATIENTS BEING ADMITTED/HAVING PROCEDURES NEED COVID-19 SCREENING  . Consult to hospitalist  [phone]  . ED EKG  . EKG 12-Lead  . Admit to Inpatient (patient's expected length of stay will be greater than 2 midnights or inpatient only procedure)  . Admit to Inpatient (patient's expected length of stay will be greater than 2 midnights or inpatient only procedure)     Following Medications were ordered in ER: Medications  fentaNYL (SUBLIMAZE) injection 50 mcg (50 mcg Intravenous Given 01/30/19 1724)  fentaNYL (SUBLIMAZE) injection 50 mcg (50 mcg Intravenous Given 01/30/19 2020)        Consult Orders  (From admission,  onward)         Start     Ordered   01/30/19 1748  Consult to hospitalist  [phone]  Once    Comments: [phone]  Provider:  (Not yet assigned)  Question Answer Comment  Place call to: [facility]   Reason for Consult Admit      01/30/19 1747          ER Provider Called: Orthopedics    Dr. Ranell PatrickNorris They Recommend admit to medicine   Will see  in ER plan to take to the OR tonight   Significant initial  Findings: Abnormal Labs Reviewed  CBC WITH DIFFERENTIAL/PLATELET - Abnormal; Notable for the following components:      Result Value   WBC 14.5 (*)    Neutro Abs 11.0 (*)    Abs Immature Granulocytes 0.08 (*)    All other components within normal limits  ETHANOL -  Abnormal; Notable for the following components:   Alcohol, Ethyl (B) 170 (*)    All other components within normal limits  COMPREHENSIVE METABOLIC PANEL - Abnormal; Notable for the following components:   CO2 21 (*)    Total Protein 8.9 (*)    AST 107 (*)    ALT 125 (*)    All other components within normal limits     Otherwise labs showing:    Recent Labs  Lab 01/30/19 1526  NA 137  K 4.8  CO2 21*  GLUCOSE 76  BUN 13  CREATININE 1.15  CALCIUM 9.0    Cr    stable,  Up from baseline see below Lab Results  Component Value Date   CREATININE 1.15 01/30/2019   CREATININE 1.12 05/08/2016   CREATININE 1.2 12/23/2006    Recent Labs  Lab 01/30/19 1526  AST 107*  ALT 125*  ALKPHOS 68  BILITOT 0.8  PROT 8.9*  ALBUMIN 4.7   Lab Results  Component Value Date   CALCIUM 9.0 01/30/2019     WBC      Component Value Date/Time   WBC 14.5 (H) 01/30/2019 1526   ANC    Component Value Date/Time   NEUTROABS 11.0 (H) 01/30/2019 1526   ALC No results found for: LYMPHOABS    Plt: Lab Results  Component Value Date   PLT 297 01/30/2019   COVID-19 Labs    Lab Results  Component Value Date   SARSCOV2NAA NEGATIVE 01/30/2019      HG/HCT stable,      Component Value Date/Time   HGB 15.1 01/30/2019 1526   HCT 45.2 01/30/2019 1526    Troponin 4.0      UA  not ordered       CXR - NON acute   ECG:  Personally reviewed by me showing: HR : 76 Rhythm:  NSR,    nonspecific changes,  QTC 433      ED Triage Vitals  Enc Vitals Group     BP 01/30/19 1338 119/79     Pulse Rate 01/30/19 1338 81     Resp 01/30/19 1338 19     Temp 01/30/19 1338 98.3 F (36.8 C)     Temp src --      SpO2 01/30/19 1338 94 %     Weight --      Height --      Head Circumference --      Peak Flow --      Pain Score 01/30/19 1335 10     Pain Loc --  Pain Edu? --      Excl. in GC? --   TMAX(24)@       Latest  Blood pressure 124/88, pulse 74, temperature 98.3 F (36.8  C), resp. rate 18, SpO2 97 %.    Hospitalist was called for admission for right tibia-fibula fracture and history of recurrent chest pain    Review of Systems:    Pertinent positives include:  Chest pain   shortness of breath at rest  Constitutional:  No weight loss, night sweats, Fevers, chills, fatigue, weight loss  HEENT:  No headaches, Difficulty swallowing,Tooth/dental problems,Sore throat,  No sneezing, itching, ear ache, nasal congestion, post nasal drip,  Cardio-vascular:  No chest pain, Orthopnea, PND, anasarca, dizziness, palpitations.no Bilateral lower extremity swelling  GI:  No heartburn, indigestion, abdominal pain, nausea, vomiting, diarrhea, change in bowel habits, loss of appetite, melena, blood in stool, hematemesis Resp:  no. No dyspnea on exertion, No excess mucus, no productive cough, No non-productive cough, No coughing up of blood.No change in color of mucus.No wheezing. Skin:  no rash or lesions. No jaundice GU:  no dysuria, change in color of urine, no urgency or frequency. No straining to urinate.  No flank pain.  Musculoskeletal:  No joint pain or no joint swelling. No decreased range of motion. No back pain.  Psych:  No change in mood or affect. No depression or anxiety. No memory loss.  Neuro: no localizing neurological complaints, no tingling, no weakness, no double vision, no gait abnormality, no slurred speech, no confusion  All systems reviewed and apart from HOPI all are negative  Past Medical History:   Past Medical History:  Diagnosis Date  . Hypertension   . Seizures (HCC)       History reviewed. No pertinent surgical history.  Social History:  Ambulatory  independently       reports that he has been smoking cigarettes. He has been smoking about 0.50 packs per day. He has never used smokeless tobacco. He reports current alcohol use. He reports that he does not use drugs.     Family History:   Family History  Problem  Relation Age of Onset  . Diabetes Neg Hx   . CAD Neg Hx     Allergies: Allergies  Allergen Reactions  . Codeine Rash     Prior to Admission medications   Medication Sig Start Date End Date Taking? Authorizing Provider  HYDROcodone-acetaminophen (NORCO/VICODIN) 5-325 MG tablet Take 1 tablet by mouth every 4 (four) hours as needed. 01/18/19   Jacalyn LefevreHaviland, Julie, MD  oxyCODONE-acetaminophen (PERCOCET/ROXICET) 5-325 MG tablet Take 1 tablet by mouth every 6 (six) hours as needed for severe pain. Patient not taking: Reported on 01/18/2019 05/08/16   Felicie MornSmith, David, NP  PRESCRIPTION MEDICATION Take 1 tablet by mouth daily. Blood Pressure Medication    [provider]   Physical Exam: Blood pressure 124/88, pulse 74, temperature 98.3 F (36.8 C), resp. rate 18, SpO2 97 %. 1. General:  in No   Acute distress   well -appearing 2. Psychological: Alert and  Oriented 3. Head/ENT:   Dry Mucous Membranes                          Head Non traumatic, neck supple                           Poor Dentition 4. SKIN: decreased turgor Sun damaged Skin ,  Skin  clean Dry and intact no rash 5. Heart: Regular rate and rhythm no  Murmur, no Rub or gallop 6. Lungs: Clear to auscultation bilaterally, no wheezes or crackles   7. Abdomen: Soft,  non-tender, Non distended bowel sounds present 8. Lower extremities: no clubbing, cyanosis, no  edema 9. Neurologically Grossly intact, moving all 4 extremities equally   10. MSK: Normal range of motion limited by right leg cast  All other LABS:     Recent Labs  Lab 01/30/19 1526  WBC 14.5*  NEUTROABS 11.0*  HGB 15.1  HCT 45.2  MCV 93.8  PLT 297     Recent Labs  Lab 01/30/19 1526  NA 137  K 4.8  CL 103  CO2 21*  GLUCOSE 76  BUN 13  CREATININE 1.15  CALCIUM 9.0     Recent Labs  Lab 01/30/19 1526  AST 107*  ALT 125*  ALKPHOS 68  BILITOT 0.8  PROT 8.9*  ALBUMIN 4.7       Cultures: No results found for: SDES, SPECREQUEST, CULT,  REPTSTATUS   Radiological Exams on Admission: Dg Ankle Complete Right  Result Date: 01/30/2019 CLINICAL DATA:  Pain after fall EXAM: RIGHT ANKLE - COMPLETE 3+ VIEW COMPARISON:  None. FINDINGS: A fracture of the distal fibula demonstrates only mild displacement. Moderate displacement is seen in the distal tibial diaphysis fracture. The ankle mortise is intact. No other acute abnormalities identified. 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 IMPRESSION: 1. Fractures of the distal fibula and tibia as described above. 2. 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 Electronically Signed   By: Gerome Samavid  [name] III M.D   On: 01/30/2019 14:33   Dg Chest Port 1 View  Result Date: 01/30/2019 CLINICAL DATA:  Fall, hypertension. EXAM: PORTABLE CHEST 1 VIEW COMPARISON:  Radiograph of January 26, 2016. FINDINGS: The heart size and mediastinal contours are within normal limits. Both lungs are clear. No pneumothorax or pleural effusion is noted. The visualized skeletal structures are unremarkable. IMPRESSION: No active disease. Electronically Signed   By: Lupita RaiderJames  Green Jr M.D.   On: 01/30/2019 16:35    Chart has been reviewed    Assessment/Plan  58 y.o. male with medical history significant of ETOH use,   Seizure Do not on any medicaitons, HTN, tobacco abuse  Admitted for tibia-fibula fracture secondary to fall, reporting intermittent chest pain that has been chronic and recurrent no evidence of acute ACS at this time  Present on Admission: . Closed fracture of right tibia and fibula -plan as per orthopedics planning to repair sometime tonight keep n.p.o.  Patient does report intermittent chest pains currently troponin negative EKG somewhat abnormal but poor baseline  discussed with cardiology will attempt to repeat ECG. Try to further risk stratify with ECHO lipid panel and HgA1c in AM   uncertain etiology of recurrent chest pain but at this point  Please re consult cardiology in AM   .  Alcohol abuse-states does not has history of alcohol withdrawal with severe delirium tremens will monitor for any evidence of withdrawals order CIWA protocol . Chest pain -monitor while on telemetry patient would likely benefit from follow-up with cardiology and do a trial of PPI were also able to tolerate p.o. ECHO repeat troponin and Further risk stratification  . Leukocytosis-most likely reactive  . Tobacco abuse at this point not interested in quitting order nicotine patch  . Fall due to stumbling -mechanical l fall no evidence of syncope   Other plan as per orders.  DVT prophylaxis:  SCD     Code Status:  FULL CODE  as per patient   Family Communication:   Family not at  Bedside    Disposition Plan    To home once workup is complete and patient is stable                     Would benefit from PT/OT eval prior to DC                       Consults called: orthopedics aware  Admission status:  ED Disposition    ED Disposition Condition [place]: [place] [[id number]]  Level of Care: Telemetry [5]  Admit to tele based on following criteria: Other see comments  Comments: chest pain  Covid Evaluation: Confirmed COVID Negative  Diagnosis: Alcohol withdrawal delirium ([place]) [291.0.ICD-9-CM]  Admitting Physician: Toy Baker [[id number]]  Attending Physician: Toy Baker [[id number]]  Estimated length of stay: 3 - 4 days  Certification:: I certify this patient will need inpatient services for at least 2 midnights  PT Class (Do Not Modify): Inpatient [101]  PT Acc Code (Do Not Modify): Private [1]         inpatient     Expect 2 midnight stay secondary to severity of patient's current illness including     Severe lab/radiological/exam abnormalities including:  Right distal tibia and fibula fracture   and extensive comorbidities including: .  substance abuse, history of seizure disorder  That are currently affecting medical  management.   I expect  patient to be hospitalized for 2 midnights requiring inpatient medical care.  Patient is at high risk for adverse outcome (such as loss of life or disability) if not treated.  Indication for inpatient stay as follows:    severe pain requiring acute inpatient management,  inability to maintain oral hydration   persistent chest pain despite medical management Need for operative/procedural  intervention   Need for  IV fluids IV pain medications     Level of care    tele  For  24H      Precautions:  NONE  No active isolations  PPE: Used by the provider:   P100  eye Goggles,  Gloves    Zoee Heeney 01/30/2019, 8:22 PM     [facility]     after 2 AM please page floor coverage PA If 7AM-7PM, please contact the day team taking care of the patient using [email or web address]

## 2019-01-30 NOTE — ED Notes (Signed)
ED TO INPATIENT HANDOFF REPORT  Name/Age/Gender Jerry PickingJoseph E Humphrey 58 y.o. male  Code Status   Home/SNF/Other Home  Chief Complaint ankle pain etoh  Level of Care/Admitting Diagnosis ED Disposition    ED Disposition Condition Comment   Admit  [facility] [[id number]]  Level of Care: Telemetry [5]  Admit to tele based on following criteria: Other see comments  Comments: chest pain  Covid Evaluation: Confirmed COVID Negative  Diagnosis: Alcohol withdrawal delirium (HCC) [291.0.ICD-9-CM]  Admitting Physician: Therisa DoyneUTOVA, ANASTASSIA [[id number]]  Attending Physician: Therisa DoyneUTOVA, ANASTASSIA [[id number]]  Estimated length of stay: 3 - 4 days  Certification:: I certify this patient will need inpatient services for at least 2 midnights  PT Class (Do Not Modify): Inpatient [101]  PT Acc Code (Do Not Modify): Private [1]       Medical History Past Medical History:  Diagnosis Date  . Hypertension   . Seizures (HCC)     Allergies Allergies  Allergen Reactions  . Codeine Rash    IV Location/Drains/Wounds Patient Lines/Drains/Airways Status   Active Line/Drains/Airways    Name:   Placement date:   Placement time:   Site:   Days:   Peripheral IV 01/30/19 Right Antecubital   01/30/19    1600    Antecubital   less than 1          Labs/Imaging Results for orders placed or performed during the hospital encounter of 01/30/19 (from the past 48 hour(s))  CBC with Differential     Status: Abnormal   Collection Time: 01/30/19  3:26 PM  Result Value Ref Range   WBC 14.5 (H) 4.0 - 10.5 K/uL   RBC 4.82 4.22 - 5.81 MIL/uL   Hemoglobin 15.1 13.0 - 17.0 g/dL   HCT 16.145.2 09.639.0 - 04.552.0 %   MCV 93.8 80.0 - 100.0 fL   MCH 31.3 26.0 - 34.0 pg   MCHC 33.4 30.0 - 36.0 g/dL   RDW 40.912.4 81.111.5 - 91.415.5 %   Platelets 297 150 - 400 K/uL   nRBC 0.0 0.0 - 0.2 %   Neutrophils Relative % 76 %   Neutro Abs 11.0 (H) 1.7 - 7.7 K/uL   Lymphocytes Relative 18 %   Lymphs Abs 2.5 0.7 - 4.0  K/uL   Monocytes Relative 5 %   Monocytes Absolute 0.7 0.1 - 1.0 K/uL   Eosinophils Relative 0 %   Eosinophils Absolute 0.0 0.0 - 0.5 K/uL   Basophils Relative 0 %   Basophils Absolute 0.1 0.0 - 0.1 K/uL   Immature Granulocytes 1 %   Abs Immature Granulocytes 0.08 (H) 0.00 - 0.07 K/uL    Comment: Performed at [facility], [address] [place], [place] [address]  Ethanol     Status: Abnormal   Collection Time: 01/30/19  3:26 PM  Result Value Ref Range   Alcohol, Ethyl (B) 170 (H) <10 mg/dL    Comment: (NOTE) Lowest detectable limit for serum alcohol is 10 mg/dL. For medical purposes only. Performed at [facility], [address] [place], [place] [address]   Comprehensive metabolic panel     Status: Abnormal   Collection Time: 01/30/19  3:26 PM  Result Value Ref Range   Sodium 137 135 - 145 mmol/L   Potassium 4.8 3.5 - 5.1 mmol/L   Chloride 103 98 - 111 mmol/L   CO2 21 (L) 22 - 32 mmol/L   Glucose, Bld 76 70 - 99 mg/dL   BUN 13  6 - 20 mg/dL   Creatinine, Ser 1.611.15 0.61 - 1.24 mg/dL   Calcium 9.0 8.9 - 09.610.3 mg/dL   Total Protein 8.9 (H) 6.5 - 8.1 g/dL   Albumin 4.7 3.5 - 5.0 g/dL   AST 045107 (H) 15 - 41 U/L   ALT 125 (H) 0 - 44 U/L   Alkaline Phosphatase 68 38 - 126 U/L   Total Bilirubin 0.8 0.3 - 1.2 mg/dL   GFR calc non Af Amer >60 >60 mL/min   GFR calc Af Amer >60 >60 mL/min   Anion gap 13 5 - 15    Comment: Performed at [facility], [address] [place], [place] [address]  Troponin I (High Sensitivity)     Status: None   Collection Time: 01/30/19  3:42 PM  Result Value Ref Range   Troponin I (High Sensitivity) 4.0 <18 ng/L    Comment: (NOTE) Elevated high sensitivity troponin I (hsTnI) values and significant  changes across serial measurements may suggest ACS but many other  chronic and acute conditions are known to elevate hsTnI results.  Refer to the "Links" section for chest pain algorithms and  additional  guidance. Performed at [facility], [address] [place], [place] [address]   SARS Coronavirus 2 (CEPHEID - Performed in [facility] hospital lab), Hosp Order     Status: None   Collection Time: 01/30/19  4:00 PM   Specimen: Nasopharyngeal Swab  Result Value Ref Range   SARS Coronavirus 2 NEGATIVE NEGATIVE    Comment: (NOTE) If result is NEGATIVE SARS-CoV-2 target nucleic acids are NOT DETECTED. The SARS-CoV-2 RNA is generally detectable in upper and lower  respiratory specimens during the acute phase of infection. The lowest  concentration of SARS-CoV-2 viral copies this assay can detect is 250  copies / mL. A negative result does not preclude SARS-CoV-2 infection  and should not be used as the sole basis for treatment or other  patient management decisions.  A negative result may occur with  improper specimen collection / handling, submission of specimen other  than nasopharyngeal swab, presence of viral mutation(s) within the  areas targeted by this assay, and inadequate number of viral copies  (<250 copies / mL). A negative result must be combined with clinical  observations, patient history, and epidemiological information. If result is POSITIVE SARS-CoV-2 target nucleic acids are DETECTED. The SARS-CoV-2 RNA is generally detectable in upper and lower  respiratory specimens dur ing the acute phase of infection.  Positive  results are indicative of active infection with SARS-CoV-2.  Clinical  correlation with patient history and other diagnostic information is  necessary to determine patient infection status.  Positive results do  not rule out bacterial infection or co-infection with other viruses. If result is PRESUMPTIVE POSTIVE SARS-CoV-2 nucleic acids MAY BE PRESENT.   A presumptive positive result was obtained on the submitted specimen  and confirmed on repeat testing.  While 2019 novel coronavirus  (SARS-CoV-2) nucleic acids may be  present in the submitted sample  additional confirmatory testing may be necessary for epidemiological  and / or clinical management purposes  to differentiate between  SARS-CoV-2 and other Sarbecovirus currently known to infect humans.  If clinically indicated additional testing with an alternate test  methodology [phone]([id number]) is advised. The SARS-CoV-2 RNA is generally  detectable in upper and lower respiratory sp ecimens during the acute  phase of infection. The expected result is Negative. Fact Sheet for Patients:  BoilerBrush.com.cyhttps://www.fda.gov/media/136312/download Fact Sheet  for Healthcare Providers: BankingDealers.co.za This test is not yet approved or cleared by the [place] and has been authorized for detection and/or diagnosis of SARS-CoV-2 by FDA under an Emergency Use Authorization (EUA).  This EUA will remain in effect (meaning this test can be used) for the duration of the COVID-19 declaration under Section 564(b)(1) of the Act, 21 U.S.C. section 360bbb-3(b)(1), unless the authorization is terminated or revoked sooner. Performed at [facility], [place] [address], [place], [place] [address]    Dg Ankle Complete Right  Result Date: 01/30/2019 CLINICAL DATA:  Pain after fall EXAM: RIGHT ANKLE - COMPLETE 3+ VIEW COMPARISON:  None. FINDINGS: A fracture of the distal fibula demonstrates only mild displacement. Moderate displacement is seen in the distal tibial diaphysis fracture. The ankle mortise is intact. No other acute abnormalities identified. 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 IMPRESSION: 1. Fractures of the distal fibula and tibia as described above. 2. 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 Electronically Signed   By: Dorise Bullion III M.D   On: 01/30/2019 14:33   Dg Chest Port 1 View  Result Date: 01/30/2019 CLINICAL DATA:  Fall, hypertension. EXAM: PORTABLE CHEST 1 VIEW COMPARISON:  Radiograph of January 26, 2016. FINDINGS: The heart size and mediastinal contours are within normal limits. Both lungs are clear. No pneumothorax or pleural effusion is noted. The visualized skeletal structures are unremarkable. IMPRESSION: No active disease. Electronically Signed   By: Marijo Conception M.D.   On: 01/30/2019 16:35    Pending Labs [employer] (From admission, onward)    Start     Ordered   Signed and Held  HIV antibody (Routine Testing)  Tomorrow morning,   R     Signed and Held   Signed and Held  CBC  Tomorrow morning,   R     Signed and Held   Signed and Held  Basic metabolic panel  Tomorrow morning,   R     Signed and Held   Signed and Held  Type and screen  Once,   R     Signed and Held   Signed and Held  VITAMIN D 25 Hydroxy (Vit-D Deficiency, Fractures)  Tomorrow morning,   R     Signed and Held          Vitals/Pain Today's Vitals   01/30/19 1828 01/30/19 1938 01/30/19 2020 01/30/19 2021  BP:  126/74  118/76  Pulse: 74 76  74  Resp:  16  18  Temp:      SpO2: 97% 98%  99%  PainSc:   9      Isolation Precautions No active isolations  Medications Medications  nicotine (NICODERM CQ - dosed in mg/24 hours) patch 21 mg (has no administration in time range)  fentaNYL (SUBLIMAZE) injection 50 mcg (50 mcg Intravenous Given 01/30/19 1724)  fentaNYL (SUBLIMAZE) injection 50 mcg (50 mcg Intravenous Given 01/30/19 2020)    Mobility walks

## 2019-01-30 NOTE — ED Notes (Signed)
XR at bedside

## 2019-01-31 ENCOUNTER — Other Ambulatory Visit: Payer: Self-pay

## 2019-01-31 ENCOUNTER — Encounter (HOSPITAL_COMMUNITY): Payer: Self-pay

## 2019-01-31 ENCOUNTER — Inpatient Hospital Stay (HOSPITAL_COMMUNITY): Payer: Self-pay

## 2019-01-31 DIAGNOSIS — R079 Chest pain, unspecified: Secondary | ICD-10-CM

## 2019-01-31 DIAGNOSIS — S82401A Unspecified fracture of shaft of right fibula, initial encounter for closed fracture: Secondary | ICD-10-CM

## 2019-01-31 DIAGNOSIS — S82201A Unspecified fracture of shaft of right tibia, initial encounter for closed fracture: Secondary | ICD-10-CM

## 2019-01-31 LAB — BASIC METABOLIC PANEL
Anion gap: 15 (ref 5–15)
BUN: 18 mg/dL (ref 6–20)
CO2: 19 mmol/L — ABNORMAL LOW (ref 22–32)
Calcium: 9 mg/dL (ref 8.9–10.3)
Chloride: 101 mmol/L (ref 98–111)
Creatinine, Ser: 1.08 mg/dL (ref 0.61–1.24)
GFR calc Af Amer: >60 mL/min (ref >60)
GFR calc non Af Amer: >60 mL/min (ref >60)
Glucose, Bld: 68 mg/dL — ABNORMAL LOW (ref 70–99)
Potassium: 4.8 mmol/L (ref 3.5–5.1)
Sodium: 135 mmol/L (ref 135–145)

## 2019-01-31 LAB — LIPID PANEL
Cholesterol: 193 mg/dL (ref 0–200)
HDL: 51 mg/dL (ref >40)
LDL Cholesterol: 125 mg/dL — ABNORMAL HIGH (ref 0–99)
Total CHOL/HDL Ratio: 3.8 RATIO
Triglycerides: 83 mg/dL (ref <150)
VLDL: 17 mg/dL (ref 0–40)

## 2019-01-31 LAB — CBC
HCT: 43.6 % (ref 39.0–52.0)
Hemoglobin: 14 g/dL (ref 13.0–17.0)
MCH: 30.4 pg (ref 26.0–34.0)
MCHC: 32.1 g/dL (ref 30.0–36.0)
MCV: 94.6 fL (ref 80.0–100.0)
Platelets: 281 10*3/uL (ref 150–400)
RBC: 4.61 MIL/uL (ref 4.22–5.81)
RDW: 12.5 % (ref 11.5–15.5)
WBC: 10.9 10*3/uL — ABNORMAL HIGH (ref 4.0–10.5)
nRBC: 0 % (ref 0.0–0.2)

## 2019-01-31 LAB — SURGICAL PCR SCREEN
MRSA, PCR: NEGATIVE
Staphylococcus aureus: NEGATIVE

## 2019-01-31 LAB — HEMOGLOBIN A1C
Hgb A1c MFr Bld: 5.3 % (ref 4.8–5.6)
Mean Plasma Glucose: 105.41 mg/dL

## 2019-01-31 LAB — GLUCOSE, CAPILLARY
Glucose-Capillary: 69 mg/dL — ABNORMAL LOW (ref 70–99)
Glucose-Capillary: 141 mg/dL — ABNORMAL HIGH (ref 70–99)

## 2019-01-31 LAB — TROPONIN I (HIGH SENSITIVITY): Troponin I (High Sensitivity): 4 ng/L (ref <18)

## 2019-01-31 LAB — ECHOCARDIOGRAM COMPLETE

## 2019-01-31 MED ORDER — HYDROXYZINE HCL 25 MG PO TABS
25.0000 mg | ORAL_TABLET | Freq: Three times a day (TID) | ORAL | Status: DC | PRN
  Administered 2019-01-31 – 2019-02-15 (×5): 25 mg via ORAL
  Filled 2019-01-31 (×5): qty 1

## 2019-01-31 MED ORDER — HYDRALAZINE HCL 20 MG/ML IJ SOLN
10.0000 mg | Freq: Once | INTRAMUSCULAR | Status: AC
  Administered 2019-01-31: 10 mg via INTRAVENOUS
  Filled 2019-01-31: qty 1

## 2019-01-31 MED ORDER — LACTATED RINGERS IV SOLN
INTRAVENOUS | Status: DC
  Administered 2019-01-31: 06:00:00 via INTRAVENOUS

## 2019-01-31 MED ORDER — DEXTROSE 50 % IV SOLN
12.5000 g | INTRAVENOUS | Status: AC
  Administered 2019-01-31: 06:00:00 12.5 g via INTRAVENOUS
  Filled 2019-01-31: qty 50

## 2019-01-31 MED ORDER — HYDROMORPHONE HCL 1 MG/ML IJ SOLN
0.5000 mg | INTRAMUSCULAR | Status: DC | PRN
  Administered 2019-01-31 – 2019-02-01 (×5): 0.5 mg via INTRAVENOUS
  Filled 2019-01-31 (×6): qty 0.5

## 2019-01-31 MED ORDER — TRAMADOL HCL 50 MG PO TABS: 50.0000 mg | ORAL_TABLET | Freq: Four times a day (QID) | ORAL | Status: DC | PRN

## 2019-01-31 MED ORDER — DEXTROSE-NACL 5-0.9 % IV SOLN
INTRAVENOUS | Status: DC
  Administered 2019-01-31: 09:00:00 via INTRAVENOUS

## 2019-01-31 MED ORDER — PANTOPRAZOLE SODIUM 40 MG PO TBEC
40.0000 mg | DELAYED_RELEASE_TABLET | Freq: Two times a day (BID) | ORAL | Status: DC
  Administered 2019-01-31 – 2019-02-16 (×32): 40 mg via ORAL
  Filled 2019-01-31 (×33): qty 1

## 2019-01-31 MED ORDER — KETOROLAC TROMETHAMINE 30 MG/ML IJ SOLN
30.0000 mg | Freq: Four times a day (QID) | INTRAMUSCULAR | Status: AC
  Administered 2019-01-31 (×2): 30 mg via INTRAVENOUS
  Filled 2019-01-31 (×2): qty 1

## 2019-01-31 NOTE — Progress Notes (Signed)
PROGRESS NOTE    Jerry PickingJoseph E Humphrey  ZOX:[id number]  DOB: [birth year]  DOA: 01/30/2019 PCP: Patient, [name]   Brief Admission Hx: 58 year old male with hypertension, tobacco, alcohol, and question of remote seizures presented after a fall with an acute right tib-fib fracture.  MDM/Assessment & Plan:   1. Acute right tib-fib fracture- this is going to require operative management.  Orthopedics has been consulted.  The patient is going to the OR 02/01/2019.  The patient is medically cleared for surgery.  Heart healthy diet ordered and n.p.o. after midnight orders placed.  Working on pain management.  Changed morphine to dilaudid. 2. History of alcoholism- patient denies history of withdrawal, we are monitoring him on CIWA protocol. 3. Atypical chest pain-likely secondary to GERD-high-sensitivity troponins have been negligible x3.  He is getting a 2D echocardiogram for completeness.  EKG with no acute or worrisome findings.  His A1c is normal.  His lipids are okay. 4. Reactive leukocytosis-WBC trending down with supportive therapy. 5. Tobacco/alcohol use-patient counseled to avoid tobacco and alcohol especially since it could delay wound healing.  He verbalized understanding.  Will offer nicotine patch while he is in the hospital. 6. Remote history of seizure - pt not taking antiepileptics.  I wonder if he had alcohol withdrawal seizure in the past? He is not sure.    DVT prophylaxis: SCDs Code Status: Full Family Communication: Patient updated at bedside, emergency contact number not in service Disposition Plan: inpatient,  Operative management   Consultants:  orthopedics  Procedures:  Tentative OR 02/01/19  Antimicrobials:     Subjective: Pt says that right ankle pain 10/10.    Objective: Vitals:   01/31/19 0126 01/31/19 0347 01/31/19 0508 01/31/19 1010  BP: (!) 174/94 138/80 (!) 143/80 (!) 162/93  Pulse: 82 93 89 89  Resp: 18  18 16   Temp: 98.2 F (36.8 C)  98.6 F  (37 C) 98.5 F (36.9 C)  TempSrc: Oral   Oral  SpO2: 100%  100% 98%    Intake/Output Summary (Last 24 hours) at 01/31/2019 1038 Last data filed at 01/31/2019 [id number] Gross per 24 hour  Intake 50 ml  Output 440 ml  Net -390 ml   There were no vitals filed for this visit.   REVIEW OF SYSTEMS  As per history otherwise all reviewed and reported negative  Exam:  General exam: awake, alert, NAD, cooperative.  Respiratory system: Clear. No increased work of breathing. Cardiovascular system: S1 & S2 heard. No JVD, murmurs, gallops, clicks or pedal edema. Gastrointestinal system: Abdomen is nondistended, soft and nontender. Normal bowel sounds heard. Central nervous system: Alert and oriented. No focal neurological deficits. Extremities: right ankle wrapped in gauze, pedal pulses palpated bilateral.  Data Reviewed: Basic Metabolic Panel: Recent Labs  Lab 01/30/19 1526 01/31/19 0121  NA 137 135  K 4.8 4.8  CL 103 101  CO2 21* 19*  GLUCOSE 76 68*  BUN 13 18  CREATININE 1.15 1.08  CALCIUM 9.0 9.0   Liver Function Tests: Recent Labs  Lab 01/30/19 1526  AST 107*  ALT 125*  ALKPHOS 68  BILITOT 0.8  PROT 8.9*  ALBUMIN 4.7   No results for input(s): LIPASE, AMYLASE in the last 168 hours. No results for input(s): AMMONIA in the last 168 hours. CBC: Recent Labs  Lab 01/30/19 1526 01/31/19 0121  WBC 14.5* 10.9*  NEUTROABS 11.0*  --   HGB 15.1 14.0  HCT 45.2 43.6  MCV 93.8 94.6  PLT 297 281  Cardiac Enzymes: No results for input(s): CKTOTAL, CKMB, CKMBINDEX, TROPONINI in the last 168 hours. CBG (last 3)  Recent Labs    01/31/19 0601 01/31/19 0634  GLUCAP 69* 141*   Recent Results (from the past 240 hour(s))  SARS Coronavirus 2 (CEPHEID - Performed in [facility] hospital lab), Hosp Order     Status: None   Collection Time: 01/30/19  4:00 PM   Specimen: Nasopharyngeal Swab  Result Value Ref Range Status   SARS Coronavirus 2 NEGATIVE NEGATIVE Final     Comment: (NOTE) If result is NEGATIVE SARS-CoV-2 target nucleic acids are NOT DETECTED. The SARS-CoV-2 RNA is generally detectable in upper and lower  respiratory specimens during the acute phase of infection. The lowest  concentration of SARS-CoV-2 viral copies this assay can detect is 250  copies / mL. A negative result does not preclude SARS-CoV-2 infection  and should not be used as the sole basis for treatment or other  patient management decisions.  A negative result may occur with  improper specimen collection / handling, submission of specimen other  than nasopharyngeal swab, presence of viral mutation(s) within the  areas targeted by this assay, and inadequate number of viral copies  (<250 copies / mL). A negative result must be combined with clinical  observations, patient history, and epidemiological information. If result is POSITIVE SARS-CoV-2 target nucleic acids are DETECTED. The SARS-CoV-2 RNA is generally detectable in upper and lower  respiratory specimens dur ing the acute phase of infection.  Positive  results are indicative of active infection with SARS-CoV-2.  Clinical  correlation with patient history and other diagnostic information is  necessary to determine patient infection status.  Positive results do  not rule out bacterial infection or co-infection with other viruses. If result is PRESUMPTIVE POSTIVE SARS-CoV-2 nucleic acids MAY BE PRESENT.   A presumptive positive result was obtained on the submitted specimen  and confirmed on repeat testing.  While 2019 novel coronavirus  (SARS-CoV-2) nucleic acids may be present in the submitted sample  additional confirmatory testing may be necessary for epidemiological  and / or clinical management purposes  to differentiate between  SARS-CoV-2 and other Sarbecovirus currently known to infect humans.  If clinically indicated additional testing with an alternate test  methodology [id number]) is advised. The SARS-CoV-2  RNA is generally  detectable in upper and lower respiratory sp ecimens during the acute  phase of infection. The expected result is Negative. Fact Sheet for Patients:  BoilerBrush.com.cyhttps://www.fda.gov/media/136312/download Fact Sheet for Healthcare Providers: https://pope.com/https://www.fda.gov/media/136313/download This test is not yet approved or cleared by the [place] FDA and has been authorized for detection and/or diagnosis of SARS-CoV-2 by FDA under an Emergency Use Authorization (EUA).  This EUA will remain in effect (meaning this test can be used) for the duration of the COVID-19 declaration under Section 564(b)(1) of the Act, 21 U.S.C. section 360bbb-3(b)(1), unless the authorization is terminated or revoked sooner. Performed at [facility], [address] [place], [place] [address]   Surgical PCR screen     Status: None   Collection Time: 01/31/19  4:50 AM   Specimen: Nasal Mucosa; Nasal Swab  Result Value Ref Range Status   MRSA, PCR NEGATIVE NEGATIVE Final   Staphylococcus aureus NEGATIVE NEGATIVE Final    Comment: (NOTE) The Xpert SA Assay (FDA approved for NASAL specimens in patients 59 years of age and older), is one component of a comprehensive surveillance program. It is not intended to diagnose infection nor to guide or  monitor treatment. Performed at [facility], [place] [address] [place], [place] [address]      Studies: Dg Ankle Complete Right  Result Date: 01/30/2019 CLINICAL DATA:  Pain after fall EXAM: RIGHT ANKLE - COMPLETE 3+ VIEW COMPARISON:  None. FINDINGS: A fracture of the distal fibula demonstrates only mild displacement. Moderate displacement is seen in the distal tibial diaphysis fracture. The ankle mortise is intact. No other acute abnormalities identified. 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 IMPRESSION: 1. Fractures of the distal fibula and tibia as described above. 2. Suspected coalition between the calcaneus and  talus. Electronically Signed   By: Dorise Bullion III M.D   On: 01/30/2019 14:33   Dg Chest Port 1 View  Result Date: 01/30/2019 CLINICAL DATA:  Fall, hypertension. EXAM: PORTABLE CHEST 1 VIEW COMPARISON:  Radiograph of January 26, 2016. FINDINGS: The heart size and mediastinal contours are within normal limits. Both lungs are clear. No pneumothorax or pleural effusion is noted. The visualized skeletal structures are unremarkable. IMPRESSION: No active disease. Electronically Signed   By: Marijo Conception M.D.   On: 01/30/2019 16:35     Scheduled Meds: . folic acid  1 mg Oral Daily  . LORazepam  0-4 mg Intravenous Q6H   Followed by  . [START ON 02/01/2019] LORazepam  0-4 mg Intravenous Q12H  . multivitamin with minerals  1 tablet Oral Daily  . nicotine  21 mg Transdermal Q24H  . pantoprazole  40 mg Oral Q1200  . senna  1 tablet Oral BID  . sucralfate  1 g Oral TID WC & HS  . thiamine  100 mg Oral Daily   Or  . thiamine  100 mg Intravenous Daily   Continuous Infusions: . dextrose 5 % and 0.9% NaCl 60 mL/hr at 01/31/19 0843  . methocarbamol (ROBAXIN) IV 500 mg (01/30/19 2332)    Active Problems:   Alcohol abuse   Chest pain   Leukocytosis   Tobacco abuse   Closed fracture of right tibia and fibula   Fall due to stumbling   Seizure disorder ([place])  Time spent:   Irwin Brakeman, MD [facility] 01/31/2019, 10:38 AM    LOS: 1 day  How to contact the [facility] Attending or Consulting provider [place] or covering provider during after hours [place], for this patient?  1. Check the care team in [facility] and look for a) attending/consulting [facility] provider listed and b) the [facility] team listed 2. Log into [email or web address] and use [place] universal password to access. If you do not have the password, please contact the hospital operator. 3. Locate the [facility] provider you are looking for under [facility] and page to a number that you can be directly reached. 4. If you still have difficulty  reaching the provider, please page the [facility] (Director on Call) for the Hospitalists listed on amion for assistance.

## 2019-01-31 NOTE — H&P (View-Only) (Signed)
Orthopedics Progress Note  Subjective: Patient still complaining of right ankle pain in the splint  Objective:  Vitals:   01/31/19 0508 01/31/19 1010  BP: (!) 143/80 (!) 162/93  Pulse: 89 89  Resp: 18 16  Temp: 98.6 F (37 C) 98.5 F (36.9 C)  SpO2: 100% 98%    General: Awake and alert  Musculoskeletal: right LE immobilized in a short leg splint and elevated. Able to wiggle toes, sensation intact Neurovascularly intact  Lab Results  Component Value Date   WBC 10.9 (H) 01/31/2019   HGB 14.0 01/31/2019   HCT 43.6 01/31/2019   MCV 94.6 01/31/2019   PLT 281 01/31/2019       Component Value Date/Time   NA 135 01/31/2019 0121   K 4.8 01/31/2019 0121   CL 101 01/31/2019 0121   CO2 19 (L) 01/31/2019 0121   GLUCOSE 68 (L) 01/31/2019 0121   BUN 18 01/31/2019 0121   CREATININE 1.08 01/31/2019 0121   CALCIUM 9.0 01/31/2019 0121   GFRNONAA >60 01/31/2019 0121   GFRAA >60 01/31/2019 0121    No results found for: INR, PROTIME  Assessment/Plan: Right unstable tib/fib ankle fracture Still awaiting medical and cards clearance for surgery. Will let him eat today and plan for surgery tomorrow AM if cleared.    Doran Heater. Veverly Fells, MD 01/31/2019 10:18 AM

## 2019-01-31 NOTE — Progress Notes (Signed)
Patient reports he is unable to take Norco/Vicodin d/t "severe itching."  Patient asking for an alternative.  Dr. Wynetta Emery notified via text page.

## 2019-01-31 NOTE — Progress Notes (Signed)
Orthopedics Progress Note  Subjective: Patient still complaining of right ankle pain in the splint  Objective:  Vitals:   01/31/19 0508 01/31/19 1010  BP: (!) 143/80 (!) 162/93  Pulse: 89 89  Resp: 18 16  Temp: 98.6 F (37 C) 98.5 F (36.9 C)  SpO2: 100% 98%    General: Awake and alert  Musculoskeletal: right LE immobilized in a short leg splint and elevated. Able to wiggle toes, sensation intact Neurovascularly intact  Lab Results  Component Value Date   WBC 10.9 (H) 01/31/2019   HGB 14.0 01/31/2019   HCT 43.6 01/31/2019   MCV 94.6 01/31/2019   PLT 281 01/31/2019       Component Value Date/Time   NA 135 01/31/2019 0121   K 4.8 01/31/2019 0121   CL 101 01/31/2019 0121   CO2 19 (L) 01/31/2019 0121   GLUCOSE 68 (L) 01/31/2019 0121   BUN 18 01/31/2019 0121   CREATININE 1.08 01/31/2019 0121   CALCIUM 9.0 01/31/2019 0121   GFRNONAA >60 01/31/2019 0121   GFRAA >60 01/31/2019 0121    No results found for: INR, PROTIME  Assessment/Plan: Right unstable tib/fib ankle fracture Still awaiting medical and cards clearance for surgery. Will let him eat today and plan for surgery tomorrow AM if cleared.    Steven R. Aleta Manternach, MD 01/31/2019 10:18 AM   

## 2019-01-31 NOTE — Progress Notes (Signed)
CRITICAL VALUE ALERT  Critical Value:  CBG 69 Date & Time Notied: 07/11 @ 4742  Provider Notified: Hollace Hayward NP  Orders Received/Actions taken: D50% 12.5g IV push + LR IVF at 9

## 2019-02-01 ENCOUNTER — Inpatient Hospital Stay (HOSPITAL_COMMUNITY): Payer: Self-pay

## 2019-02-01 ENCOUNTER — Inpatient Hospital Stay (HOSPITAL_COMMUNITY): Payer: Self-pay | Admitting: Anesthesiology

## 2019-02-01 ENCOUNTER — Encounter (HOSPITAL_COMMUNITY)
Admission: EM | Disposition: A | Discharge status: Home Health | Payer: Self-pay | Source: Home / Self Care | Attending: Family Medicine

## 2019-02-01 DIAGNOSIS — W010XXD Fall on same level from slipping, tripping and stumbling without subsequent striking against object, subsequent encounter: Secondary | ICD-10-CM

## 2019-02-01 DIAGNOSIS — S82209A Unspecified fracture of shaft of unspecified tibia, initial encounter for closed fracture: Secondary | ICD-10-CM | POA: Diagnosis present

## 2019-02-01 HISTORY — PX: OPEN REDUCTION INTERNAL FIXATION (ORIF) TIBIA/FIBULA FRACTURE: SHX5992

## 2019-02-01 LAB — CREATININE, SERUM
Creatinine, Ser: 1.19 mg/dL (ref 0.61–1.24)
GFR calc Af Amer: >60 mL/min (ref >60)
GFR calc non Af Amer: >60 mL/min (ref >60)

## 2019-02-01 LAB — CBC
HCT: 39.1 % (ref 39.0–52.0)
Hemoglobin: 12.5 g/dL — ABNORMAL LOW (ref 13.0–17.0)
MCH: 30.5 pg (ref 26.0–34.0)
MCHC: 32 g/dL (ref 30.0–36.0)
MCV: 95.4 fL (ref 80.0–100.0)
Platelets: 216 10*3/uL (ref 150–400)
RBC: 4.1 MIL/uL — ABNORMAL LOW (ref 4.22–5.81)
RDW: 12.3 % (ref 11.5–15.5)
WBC: 5.8 10*3/uL (ref 4.0–10.5)
nRBC: 0 % (ref 0.0–0.2)

## 2019-02-01 LAB — ABO/RH: ABO/RH(D): O POS

## 2019-02-01 IMAGING — RF RIGHT TIBIA AND FIBULA - 2 VIEW
1 series · 7 of 7 positions shown · non-contrast
Comparison: Left ankle radiograph [DATE]

CLINICAL DATA: Distal tibia and fibular fractures.  ORIF.

EXAM:
RIGHT TIBIA AND FIBULA - 2 VIEW; DG C-ARM 1-60 MIN-NO REPORT

[Series 1: unknown protocol · 0.20mm/px · 7 of 7 slices shown]
[im 1/7]
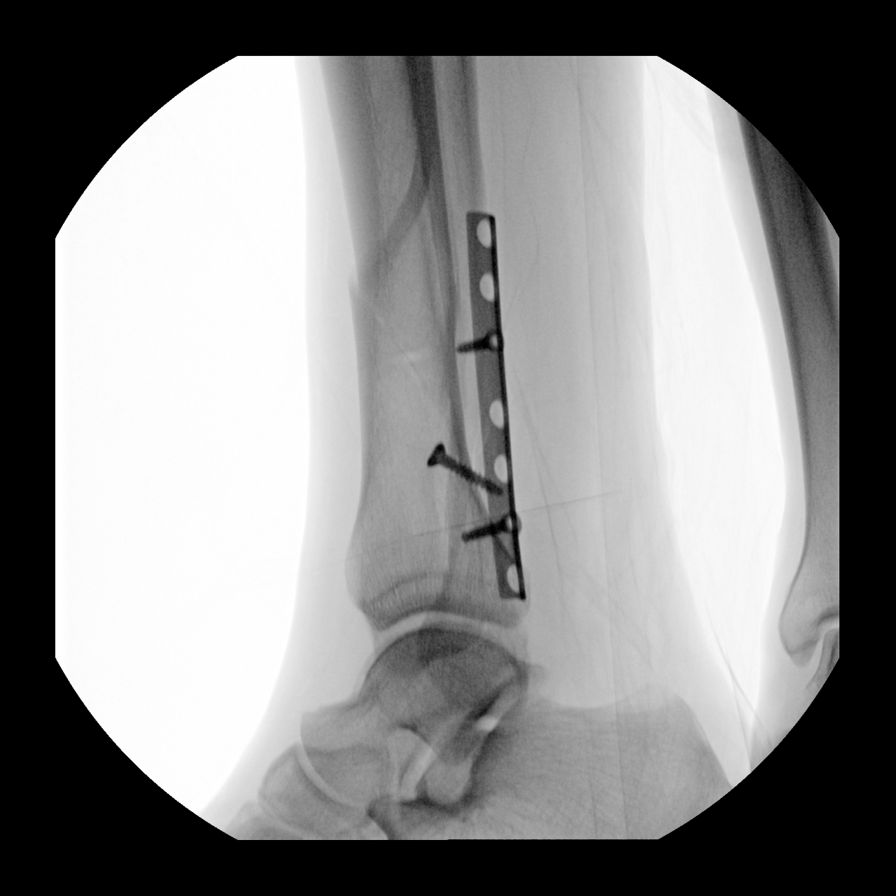
[im 2/7]
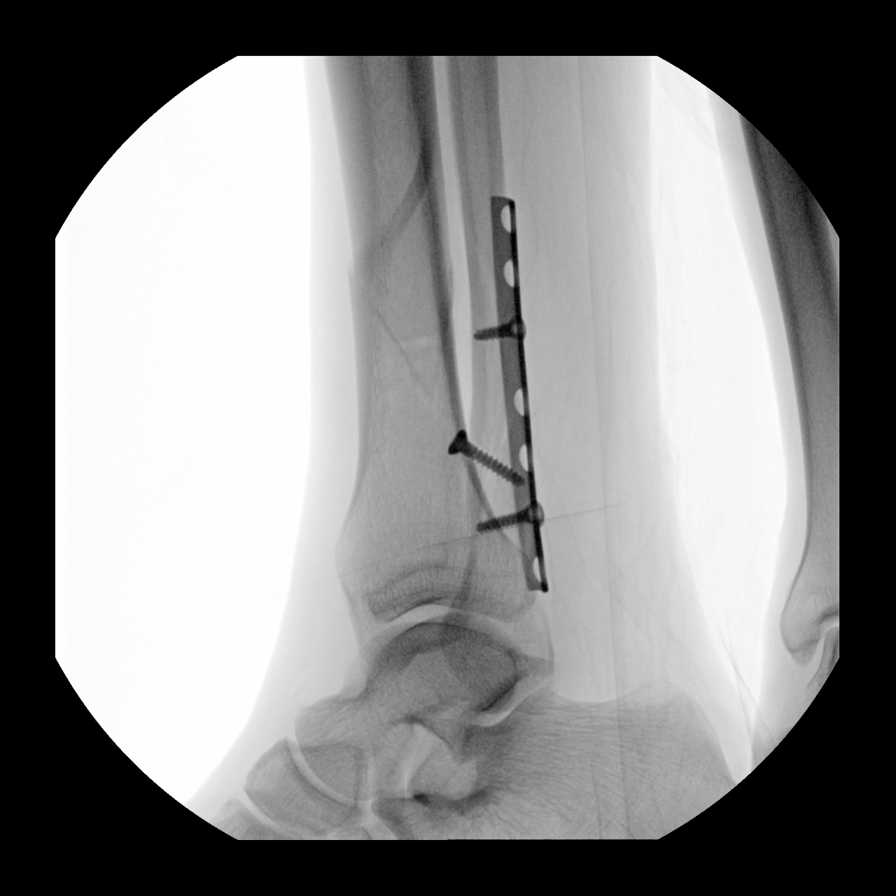
[im 3/7]
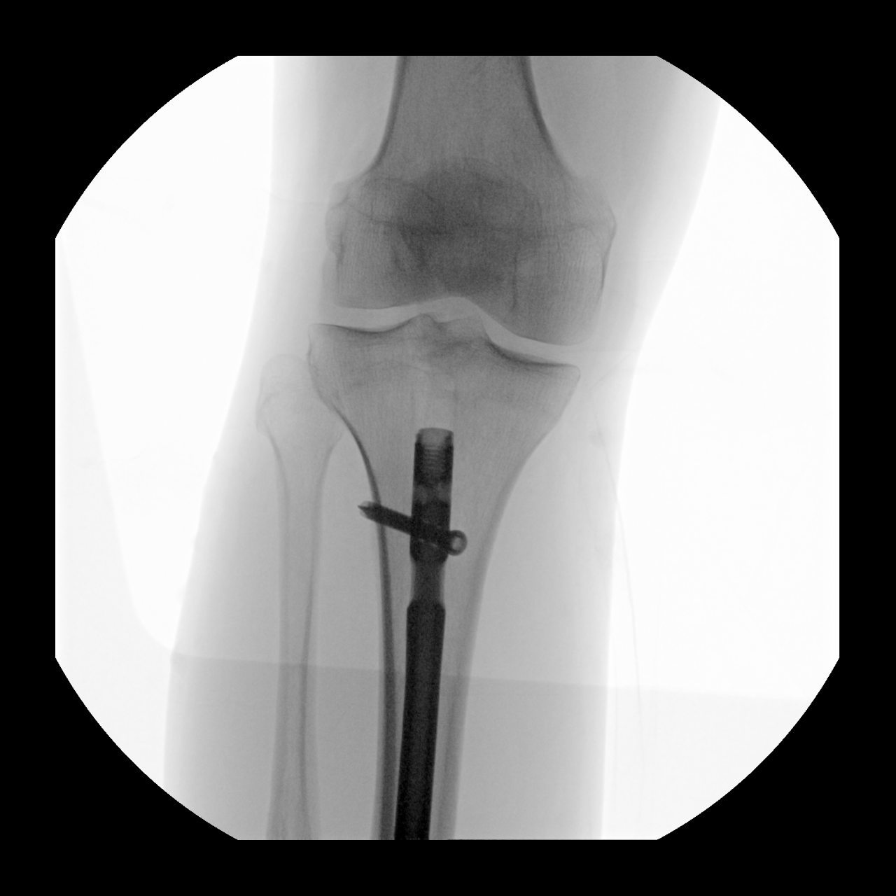
[im 4/7]
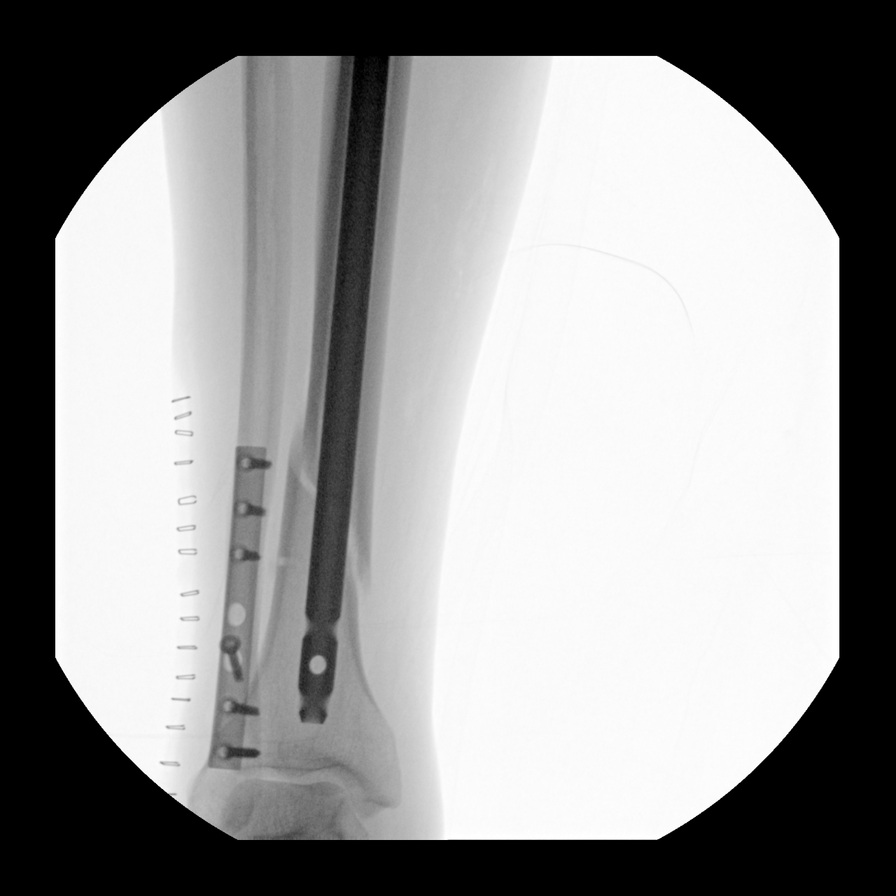
[im 5/7]
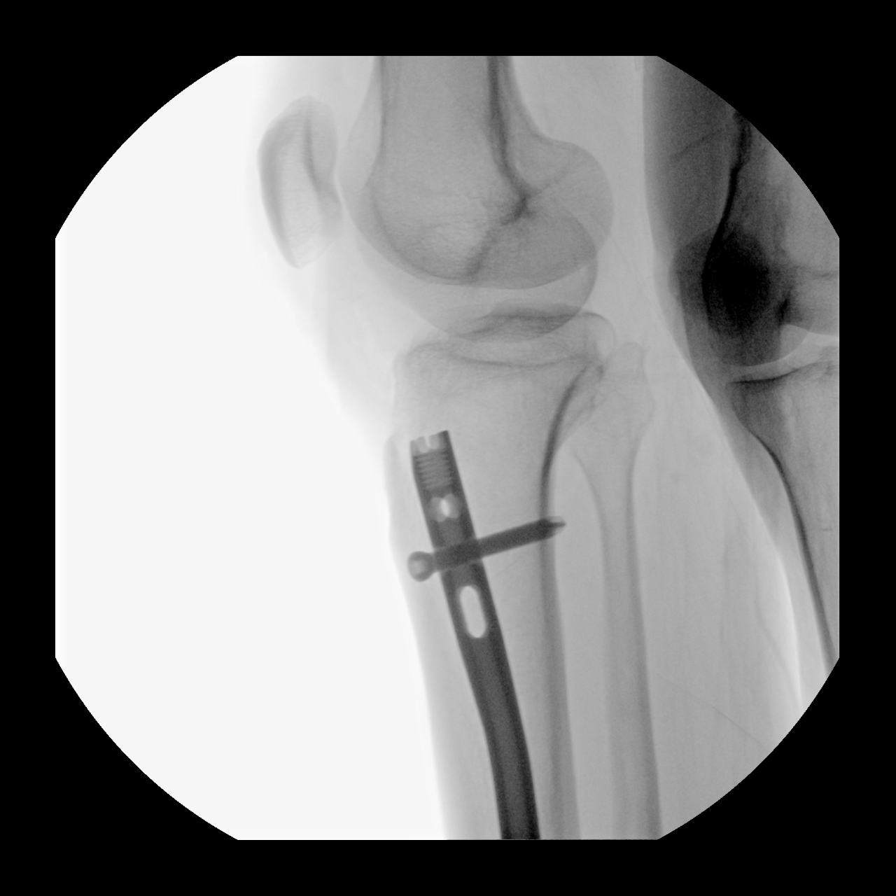
[im 6/7]
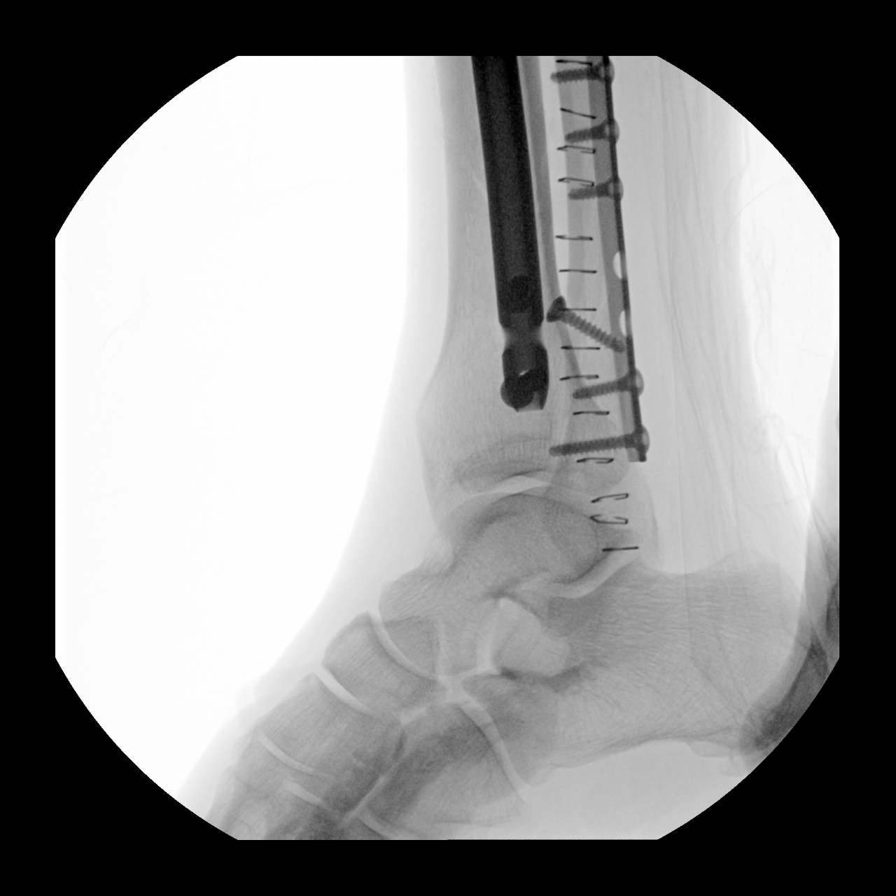
[im 7/7]
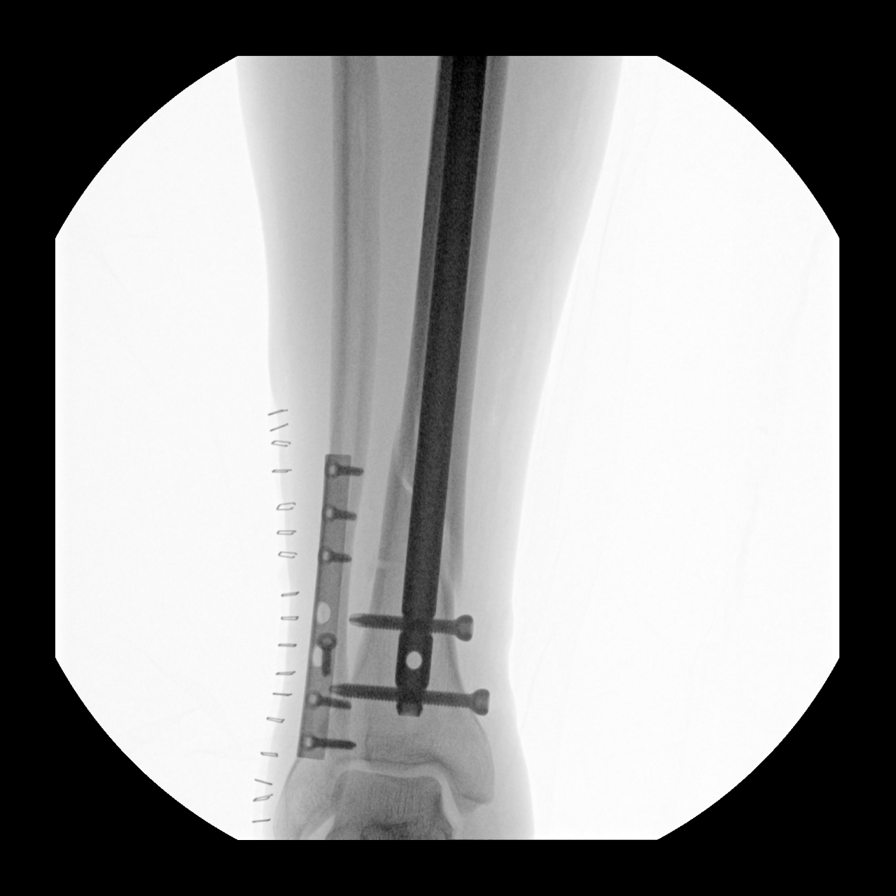

[7 of 7 positions shown; findings below may reference images not displayed]

FINDINGS: Posterolateral plate and screw fixation is present the distal
fibula. The fracture is reduced.

Intramedullary rod is placed in the femur. There are 2 distal and 1
proximal interlocking screws. There is near anatomic reduction with
minimal residual medial displacement.
IMPRESSION: 1. Near anatomic reduction of distal tibia and fibular fractures. No
radiographic evidence for complication.

## 2019-02-01 SURGERY — OPEN REDUCTION INTERNAL FIXATION (ORIF) TIBIA/FIBULA FRACTURE
Anesthesia: General | Site: Ankle | Laterality: Right

## 2019-02-01 MED ORDER — METOCLOPRAMIDE HCL 5 MG PO TABS: 5.0000 mg | ORAL_TABLET | Freq: Three times a day (TID) | ORAL | Status: DC | PRN

## 2019-02-01 MED ORDER — MIDAZOLAM HCL 2 MG/2ML IJ SOLN
INTRAMUSCULAR | Status: AC
  Filled 2019-02-01: qty 2

## 2019-02-01 MED ORDER — METOCLOPRAMIDE HCL 5 MG/ML IJ SOLN: 5.0000 mg | Freq: Three times a day (TID) | INTRAMUSCULAR | Status: DC | PRN

## 2019-02-01 MED ORDER — LACTATED RINGERS IV SOLN
INTRAVENOUS | Status: DC | PRN
  Administered 2019-02-01: 11:00:00 via INTRAVENOUS

## 2019-02-01 MED ORDER — ONDANSETRON HCL 4 MG/2ML IJ SOLN: 4.0000 mg | Freq: Four times a day (QID) | INTRAMUSCULAR | Status: DC | PRN

## 2019-02-01 MED ORDER — FENTANYL CITRATE (PF) 100 MCG/2ML IJ SOLN
INTRAMUSCULAR | Status: AC
  Filled 2019-02-01: qty 2

## 2019-02-01 MED ORDER — CEFAZOLIN SODIUM-DEXTROSE 2-4 GM/100ML-% IV SOLN
2.0000 g | Freq: Four times a day (QID) | INTRAVENOUS | Status: AC
  Administered 2019-02-01 – 2019-02-02 (×3): 2 g via INTRAVENOUS
  Filled 2019-02-01 (×4): qty 100

## 2019-02-01 MED ORDER — METHOCARBAMOL 500 MG PO TABS: 500.0000 mg | ORAL_TABLET | Freq: Three times a day (TID) | ORAL | 1 refills | Status: DC | PRN

## 2019-02-01 MED ORDER — HYDROMORPHONE HCL 1 MG/ML IJ SOLN
0.5000 mg | INTRAMUSCULAR | Status: DC | PRN
  Administered 2019-02-01 – 2019-02-03 (×7): 1 mg via INTRAVENOUS
  Filled 2019-02-01 (×8): qty 1

## 2019-02-01 MED ORDER — 0.9 % SODIUM CHLORIDE (POUR BTL) OPTIME
TOPICAL | Status: DC | PRN
  Administered 2019-02-01: 12:00:00 1000 mL

## 2019-02-01 MED ORDER — ONDANSETRON HCL 4 MG/2ML IJ SOLN
INTRAMUSCULAR | Status: AC
  Filled 2019-02-01: qty 2

## 2019-02-01 MED ORDER — MIDAZOLAM HCL 2 MG/2ML IJ SOLN
INTRAMUSCULAR | Status: DC | PRN
  Administered 2019-02-01: 2 mg via INTRAVENOUS

## 2019-02-01 MED ORDER — CHLORHEXIDINE GLUCONATE 4 % EX LIQD
60.0000 mL | Freq: Once | CUTANEOUS | Status: AC
  Administered 2019-02-01: 4 via TOPICAL
  Filled 2019-02-01: qty 60

## 2019-02-01 MED ORDER — METHOCARBAMOL 1000 MG/10ML IJ SOLN: 500.0000 mg | Freq: Four times a day (QID) | INTRAVENOUS | Status: DC | PRN

## 2019-02-01 MED ORDER — POLYETHYLENE GLYCOL 3350 17 G PO PACK: 17.0000 g | PACK | Freq: Every day | ORAL | Status: DC | PRN

## 2019-02-01 MED ORDER — LORAZEPAM 2 MG/ML IJ SOLN
0.0000 mg | Freq: Four times a day (QID) | INTRAMUSCULAR | Status: AC
  Administered 2019-02-01 (×2): 1 mg via INTRAVENOUS
  Filled 2019-02-01: qty 1

## 2019-02-01 MED ORDER — DEXMEDETOMIDINE HCL IN NACL 200 MCG/50ML IV SOLN
INTRAVENOUS | Status: AC
  Filled 2019-02-01: qty 50

## 2019-02-01 MED ORDER — PROPOFOL 10 MG/ML IV BOLUS
INTRAVENOUS | Status: DC | PRN
  Administered 2019-02-01: 170 mg via INTRAVENOUS

## 2019-02-01 MED ORDER — OXYCODONE HCL 5 MG PO TABS: 5.0000 mg | ORAL_TABLET | ORAL | Status: DC | PRN

## 2019-02-01 MED ORDER — FENTANYL CITRATE (PF) 100 MCG/2ML IJ SOLN
25.0000 ug | INTRAMUSCULAR | Status: DC | PRN
  Administered 2019-02-01 (×3): 50 ug via INTRAVENOUS

## 2019-02-01 MED ORDER — SUCCINYLCHOLINE CHLORIDE 200 MG/10ML IV SOSY
PREFILLED_SYRINGE | INTRAVENOUS | Status: DC | PRN
  Administered 2019-02-01: 140 mg via INTRAVENOUS

## 2019-02-01 MED ORDER — METHOCARBAMOL 500 MG PO TABS: 500.0000 mg | ORAL_TABLET | Freq: Four times a day (QID) | ORAL | Status: DC | PRN

## 2019-02-01 MED ORDER — CEFAZOLIN SODIUM-DEXTROSE 2-4 GM/100ML-% IV SOLN
2.0000 g | INTRAVENOUS | Status: AC
  Administered 2019-02-01: 2 g via INTRAVENOUS
  Filled 2019-02-01: qty 100

## 2019-02-01 MED ORDER — DOCUSATE SODIUM 100 MG PO CAPS
100.0000 mg | ORAL_CAPSULE | Freq: Two times a day (BID) | ORAL | Status: DC
  Administered 2019-02-01 – 2019-02-16 (×28): 100 mg via ORAL
  Filled 2019-02-01 (×29): qty 1

## 2019-02-01 MED ORDER — BISACODYL 10 MG RE SUPP: 10.0000 mg | Freq: Every day | RECTAL | Status: DC | PRN

## 2019-02-01 MED ORDER — DEXAMETHASONE SODIUM PHOSPHATE 10 MG/ML IJ SOLN
INTRAMUSCULAR | Status: AC
  Filled 2019-02-01: qty 1

## 2019-02-01 MED ORDER — LORAZEPAM 2 MG/ML IJ SOLN
0.0000 mg | Freq: Two times a day (BID) | INTRAMUSCULAR | Status: AC
  Administered 2019-02-02: 1 mg via INTRAVENOUS
  Administered 2019-02-02: 2 mg via INTRAVENOUS
  Filled 2019-02-01 (×2): qty 1

## 2019-02-01 MED ORDER — PROPOFOL 10 MG/ML IV BOLUS
INTRAVENOUS | Status: AC
  Filled 2019-02-01: qty 20

## 2019-02-01 MED ORDER — FENTANYL CITRATE (PF) 100 MCG/2ML IJ SOLN
INTRAMUSCULAR | Status: DC | PRN
  Administered 2019-02-01 (×4): 50 ug via INTRAVENOUS

## 2019-02-01 MED ORDER — DEXAMETHASONE SODIUM PHOSPHATE 10 MG/ML IJ SOLN
INTRAMUSCULAR | Status: DC | PRN
  Administered 2019-02-01: 4 mg via INTRAVENOUS

## 2019-02-01 MED ORDER — OXYCODONE HCL 5 MG PO TABS
10.0000 mg | ORAL_TABLET | ORAL | Status: DC | PRN
  Administered 2019-02-01: 10 mg via ORAL
  Administered 2019-02-02 (×2): 15 mg via ORAL
  Administered 2019-02-03: 10 mg via ORAL
  Filled 2019-02-01 (×2): qty 3
  Filled 2019-02-01: qty 2
  Filled 2019-02-01: qty 3

## 2019-02-01 MED ORDER — ONDANSETRON HCL 4 MG PO TABS: 4.0000 mg | ORAL_TABLET | Freq: Four times a day (QID) | ORAL | Status: DC | PRN

## 2019-02-01 MED ORDER — ONDANSETRON HCL 4 MG/2ML IJ SOLN
INTRAMUSCULAR | Status: DC | PRN
  Administered 2019-02-01: 4 mg via INTRAVENOUS

## 2019-02-01 MED ORDER — DEXMEDETOMIDINE HCL 200 MCG/2ML IV SOLN
INTRAVENOUS | Status: DC | PRN
  Administered 2019-02-01 (×2): 8 ug via INTRAVENOUS
  Administered 2019-02-01: 4 ug via INTRAVENOUS

## 2019-02-01 MED ORDER — ENOXAPARIN SODIUM 40 MG/0.4ML ~~LOC~~ SOLN
40.0000 mg | SUBCUTANEOUS | Status: DC
  Administered 2019-02-02 – 2019-02-16 (×15): 40 mg via SUBCUTANEOUS
  Filled 2019-02-01 (×15): qty 0.4

## 2019-02-01 MED ORDER — ROCURONIUM BROMIDE 10 MG/ML (PF) SYRINGE
PREFILLED_SYRINGE | INTRAVENOUS | Status: DC | PRN
  Administered 2019-02-01: 50 mg via INTRAVENOUS

## 2019-02-01 MED ORDER — OXYCODONE-ACETAMINOPHEN 5-325 MG PO TABS: 1.0000 | ORAL_TABLET | Freq: Four times a day (QID) | ORAL | 0 refills | Status: DC | PRN

## 2019-02-01 MED ORDER — LIDOCAINE 2% (20 MG/ML) 5 ML SYRINGE
INTRAMUSCULAR | Status: DC | PRN
  Administered 2019-02-01: 80 mg via INTRAVENOUS

## 2019-02-01 MED ORDER — SODIUM CHLORIDE 0.9 % IV SOLN
INTRAVENOUS | Status: DC
  Administered 2019-02-01 – 2019-02-04 (×4): via INTRAVENOUS

## 2019-02-01 MED ORDER — ACETAMINOPHEN 325 MG PO TABS
325.0000 mg | ORAL_TABLET | Freq: Four times a day (QID) | ORAL | Status: DC | PRN
  Administered 2019-02-03 – 2019-02-04 (×2): 650 mg via ORAL
  Filled 2019-02-01 (×3): qty 2

## 2019-02-01 MED ORDER — SUGAMMADEX SODIUM 200 MG/2ML IV SOLN
INTRAVENOUS | Status: DC | PRN
  Administered 2019-02-01: 200 mg via INTRAVENOUS

## 2019-02-01 SURGICAL SUPPLY — 67 items
BAG ZIPLOCK 12X15 (MISCELLANEOUS) ×3 IMPLANT
BIT DRILL 2.5X2.75 QC CALB (BIT) ×2 IMPLANT
BIT DRILL 3.5X5.5 QC CALB (BIT) ×2 IMPLANT
BIT DRILL 3.8X6 NS (BIT) ×2 IMPLANT
BIT DRILL 4.4 NS (BIT) ×2 IMPLANT
BNDG ELASTIC 6X10 VLCR STRL LF (GAUZE/BANDAGES/DRESSINGS) ×2 IMPLANT
COVER SURGICAL LIGHT HANDLE (MISCELLANEOUS) ×3 IMPLANT
COVER WAND RF STERILE (DRAPES) IMPLANT
CUFF TOURN SGL QUICK 34 (TOURNIQUET CUFF) ×2
CUFF TRNQT CYL 34X4.125X (TOURNIQUET CUFF) ×1 IMPLANT
DRAPE C-ARM 42X120 X-RAY (DRAPES) ×3 IMPLANT
DRAPE C-ARMOR (DRAPES) ×2 IMPLANT
DRAPE U-SHAPE 47X51 STRL (DRAPES) ×3 IMPLANT
DRSG KUZMA FLUFF (GAUZE/BANDAGES/DRESSINGS) ×2 IMPLANT
DRSG PAD ABDOMINAL 8X10 ST (GAUZE/BANDAGES/DRESSINGS) ×3 IMPLANT
DURAPREP 26ML APPLICATOR (WOUND CARE) ×3 IMPLANT
ELECT REM PT RETURN 15FT ADLT (MISCELLANEOUS) ×3 IMPLANT
GAUZE SPONGE 4X4 12PLY STRL (GAUZE/BANDAGES/DRESSINGS) ×6 IMPLANT
GAUZE XEROFORM 5X9 LF (GAUZE/BANDAGES/DRESSINGS) ×5 IMPLANT
GLOVE BIOGEL PI IND STRL 7.5 (GLOVE) IMPLANT
GLOVE BIOGEL PI IND STRL 8 (GLOVE) IMPLANT
GLOVE BIOGEL PI INDICATOR 7.5 (GLOVE) ×4
GLOVE BIOGEL PI INDICATOR 8 (GLOVE) ×2
GLOVE BIOGEL PI ORTHO PRO 7.5 (GLOVE) ×4
GLOVE BIOGEL PI ORTHO PRO SZ8 (GLOVE) ×4
GLOVE ECLIPSE 8.0 STRL XLNG CF (GLOVE) ×2 IMPLANT
GLOVE PI ORTHO PRO STRL 7.5 (GLOVE) IMPLANT
GLOVE PI ORTHO PRO STRL SZ8 (GLOVE) IMPLANT
GLOVE SURG ORTHO 8.5 STRL (GLOVE) ×3 IMPLANT
GOWN STRL REUS W/ TWL XL LVL3 (GOWN DISPOSABLE) IMPLANT
GOWN STRL REUS W/TWL LRG LVL3 (GOWN DISPOSABLE) ×4 IMPLANT
GOWN STRL REUS W/TWL XL LVL3 (GOWN DISPOSABLE) ×4
GUIDEPIN 3.2X17.5 THRD DISP (PIN) ×2 IMPLANT
GUIDEWIRE BALL NOSE 80CM (WIRE) ×2 IMPLANT
KIT TURNOVER KIT A (KITS) ×2 IMPLANT
MANIFOLD NEPTUNE II (INSTRUMENTS) ×3 IMPLANT
NAIL TIBIAL 10MMX33CM (Nail) ×2 IMPLANT
PACK ORTHO EXTREMITY (CUSTOM PROCEDURE TRAY) ×3 IMPLANT
PAD CAST 4YDX4 CTTN HI CHSV (CAST SUPPLIES) ×2 IMPLANT
PADDING CAST ABS 4INX4YD NS (CAST SUPPLIES) ×2
PADDING CAST ABS COTTON 4X4 ST (CAST SUPPLIES) IMPLANT
PADDING CAST COTTON 4X4 STRL (CAST SUPPLIES) ×4
PLATE ACE 100DEG 7HOLE (Plate) ×2 IMPLANT
PROTECTOR NERVE ULNAR (MISCELLANEOUS) ×3 IMPLANT
SCREW ACECAP 36MM (Screw) ×2 IMPLANT
SCREW ACECAP 46MM (Screw) ×2 IMPLANT
SCREW ACECAP 50MM (Screw) ×2 IMPLANT
SCREW CORTICAL 3.5MM  12MM (Screw) ×2 IMPLANT
SCREW CORTICAL 3.5MM  16MM (Screw) ×2 IMPLANT
SCREW CORTICAL 3.5MM  20MM (Screw) ×2 IMPLANT
SCREW CORTICAL 3.5MM 12MM (Screw) IMPLANT
SCREW CORTICAL 3.5MM 14MM (Screw) ×2 IMPLANT
SCREW CORTICAL 3.5MM 16MM (Screw) IMPLANT
SCREW CORTICAL 3.5MM 18MM (Screw) ×2 IMPLANT
SCREW CORTICAL 3.5MM 20MM (Screw) IMPLANT
SCREW PROXIMAL DEPUY (Screw) ×2 IMPLANT
SCREW PRXML FT 45X5.5XLCK NS (Screw) IMPLANT
SPLINT PLASTER CAST XFAST 5X30 (CAST SUPPLIES) IMPLANT
SPLINT PLASTER XFAST SET 5X30 (CAST SUPPLIES) ×2
SPONGE LAP 4X18 RFD (DISPOSABLE) ×3 IMPLANT
SUT ETHILON 4 0 PS 2 18 (SUTURE) ×6 IMPLANT
SUT VIC AB 1 CT1 27 (SUTURE) ×4
SUT VIC AB 1 CT1 27XBRD ANTBC (SUTURE) ×2 IMPLANT
SUT VIC AB 2-0 CT1 27 (SUTURE) ×2
SUT VIC AB 2-0 CT1 TAPERPNT 27 (SUTURE) ×1 IMPLANT
SUT VIC AB 2-0 CT2 27 (SUTURE) ×4 IMPLANT
TOWEL OR 17X26 10 PK STRL BLUE (TOWEL DISPOSABLE) ×6 IMPLANT

## 2019-02-01 NOTE — Brief Op Note (Signed)
02/01/2019  1:22 PM  PATIENT:  Jerry Humphrey  58 y.o. male  PRE-OPERATIVE DIAGNOSIS:  right tibia/fibula fracture, displaced  POST-OPERATIVE DIAGNOSIS:  right tibia/fibula fracture, displaced  PROCEDURE:  Procedure(s): OPEN REDUCTION INTERNAL FIXATION (ORIF) TIBIA/FIBULA FRACTURE (Right) Open plating of displaced fibula fracture and closed IM Nailing of the displaced tibia fracture Biomet Versa-Nail and Biomet small frag set  SURGEON:  Surgeon(s) and Role:    Netta Cedars, MD - Primary  PHYSICIAN ASSISTANT:   ASSISTANTS: Ventura Bruns, PA-C   ANESTHESIA:   general  EBL:  25 mL   BLOOD ADMINISTERED:none  DRAINS: none   LOCAL MEDICATIONS USED:  NONE    SPECIMEN:  No Specimen  DISPOSITION OF SPECIMEN:  N/A  COUNTS:  YES  TOURNIQUET:   Total Tourniquet Time Documented: Thigh (Right) - 88 minutes Total: Thigh (Right) - 88 minutes   DICTATION: .Other Dictation: Dictation Number [id number]  PLAN OF CARE: Admit to inpatient   PATIENT DISPOSITION:  PACU - hemodynamically stable.   Delay start of Pharmacological VTE agent (>24hrs) due to surgical blood loss or risk of bleeding: no

## 2019-02-01 NOTE — Progress Notes (Signed)
PROGRESS NOTE  Jerry Humphrey  QMV:[id number]  DOB: 02-14-1961  DOA: 01/30/2019 PCP: Patient, [name]  Brief Admission Hx: 58 year old male with hypertension, tobacco, alcohol, and question of remote seizures presented after a fall with an acute right tib-fib fracture.  MDM/Assessment & Plan:   1. Acute right tib-fib fracture- this is going to require operative management.  Orthopedics has been consulted.  The patient is going to the OR 02/01/2019.  The patient is medically cleared for surgery.   Changed morphine to dilaudid and pain better controlled.  Pt wants to meet with case management about starting disability papers.  He wants SNF rehab.  Will consult TOC team.  2. History of alcoholism- patient denies history of withdrawal, we are monitoring him on CIWA protocol. 3. Atypical chest pain-likely secondary to GERD-high-sensitivity troponins have been negligible x3.  He is getting a 2D echocardiogram for completeness.  EKG with no acute or worrisome findings.  His A1c is normal.  His lipids are okay. 4. Reactive leukocytosis-WBC trending down with supportive therapy. 5. Tobacco/alcohol use-patient counseled to avoid tobacco and alcohol especially since it could delay wound healing.  He verbalized understanding.  Will offer nicotine patch while he is in the hospital. 6. Remote history of seizure - pt not taking antiepileptics.  I wonder if he had alcohol withdrawal seizure in the past? He is not sure.    DVT prophylaxis: SCDs Code Status: Full Family Communication: Patient updated at bedside, emergency contact number not in service Disposition Plan: inpatient,  Operative management   Consultants:  orthopedics  Procedures:  OR 02/01/19  Echocardiogram 01/31/19 IMPRESSIONS  1. The left ventricle has normal systolic function with an ejection fraction of 60-65%. The cavity size was normal. There is mild asymmetric left ventricular hypertrophy. Left ventricular diastolic Doppler  parameters are consistent with impaired  relaxation.  2. The right ventricle has normal systolic function. The cavity was normal. There is no increase in right ventricular wall thickness.  3. Mild aortic annular calcification noted.  Antimicrobials:     Subjective: Pt says that right ankle pain is a little better controlled today.  He wants to speak with CM about disability and feels he needs SNF rehab.   Objective: Vitals:   01/31/19 2333 02/01/19 0536 02/01/19 0701 02/01/19 0900  BP: (!) 155/97  (!) 140/94   Pulse: 82  76   Resp: 16  18   Temp: 98.7 F (37.1 C)  98.5 F (36.9 C)   TempSrc: Oral     SpO2: 95%  98%   Weight:    82.6 kg  Height:  5\' 8"  (1.727 m)      Intake/Output Summary (Last 24 hours) at 02/01/2019 1142 Last data filed at 02/01/2019 2841 Gross per 24 hour  Intake 1024.87 ml  Output 1600 ml  Net -575.13 ml   Filed Weights   02/01/19 0900  Weight: 82.6 kg     REVIEW OF SYSTEMS  As per history otherwise all reviewed and reported negative  Exam:  General exam: awake, alert, NAD, cooperative.  Respiratory system: Clear. No increased work of breathing. Cardiovascular system: S1 & S2 heard. No JVD, murmurs, gallops, clicks or pedal edema. Gastrointestinal system: Abdomen is nondistended, soft and nontender. Normal bowel sounds heard. Central nervous system: Alert and oriented. No focal neurological deficits. Extremities: right ankle wrapped in gauze, pedal pulses palpated bilateral.  Data Reviewed: Basic Metabolic Panel: Recent Labs  Lab 01/30/19 1526 01/31/19 0121  NA 137 135  K  4.8 4.8  CL 103 101  CO2 21* 19*  GLUCOSE 76 68*  BUN 13 18  CREATININE 1.15 1.08  CALCIUM 9.0 9.0   Liver Function Tests: Recent Labs  Lab 01/30/19 1526  AST 107*  ALT 125*  ALKPHOS 68  BILITOT 0.8  PROT 8.9*  ALBUMIN 4.7   No results for input(s): LIPASE, AMYLASE in the last 168 hours. No results for input(s): AMMONIA in the last 168 hours. CBC:  Recent Labs  Lab 01/30/19 1526 01/31/19 0121 02/01/19 0455  WBC 14.5* 10.9* 5.8  NEUTROABS 11.0*  --   --   HGB 15.1 14.0 12.5*  HCT 45.2 43.6 39.1  MCV 93.8 94.6 95.4  PLT 297 281 216   Cardiac Enzymes: No results for input(s): CKTOTAL, CKMB, CKMBINDEX, TROPONINI in the last 168 hours. CBG (last 3)  Recent Labs    01/31/19 0601 01/31/19 0634  GLUCAP 69* 141*   Recent Results (from the past 240 hour(s))  SARS Coronavirus 2 (CEPHEID - Performed in [facility] lab), Hosp Order     Status: None   Collection Time: 01/30/19  4:00 PM   Specimen: Nasopharyngeal Swab  Result Value Ref Range Status   SARS Coronavirus 2 NEGATIVE NEGATIVE Final    Comment: (NOTE) If result is NEGATIVE SARS-CoV-2 target nucleic acids are NOT DETECTED. The SARS-CoV-2 RNA is generally detectable in upper and lower  respiratory specimens during the acute phase of infection. The lowest  concentration of SARS-CoV-2 viral copies this assay can detect is 250  copies / mL. A negative result does not preclude SARS-CoV-2 infection  and should not be used as the sole basis for treatment or other  patient management decisions.  A negative result may occur with  improper specimen collection / handling, submission of specimen other  than nasopharyngeal swab, presence of viral mutation(s) within the  areas targeted by this assay, and inadequate number of viral copies  (<250 copies / mL). A negative result must be combined with clinical  observations, patient history, and epidemiological information. If result is POSITIVE SARS-CoV-2 target nucleic acids are DETECTED. The SARS-CoV-2 RNA is generally detectable in upper and lower  respiratory specimens dur ing the acute phase of infection.  Positive  results are indicative of active infection with SARS-CoV-2.  Clinical  correlation with patient history and other diagnostic information is  necessary to determine patient infection status.  Positive results  do  not rule out bacterial infection or co-infection with other viruses. If result is PRESUMPTIVE POSTIVE SARS-CoV-2 nucleic acids MAY BE PRESENT.   A presumptive positive result was obtained on the submitted specimen  and confirmed on repeat testing.  While 2019 novel coronavirus  (SARS-CoV-2) nucleic acids may be present in the submitted sample  additional confirmatory testing may be necessary for epidemiological  and / or clinical management purposes  to differentiate between  SARS-CoV-2 and other Sarbecovirus currently known to infect humans.  If clinically indicated additional testing with an alternate test  methodology [phone] is advised. The SARS-CoV-2 RNA is generally  detectable in upper and lower respiratory sp ecimens during the acute  phase of infection. The expected result is Negative. Fact Sheet for Patients:  BoilerBrush.com.cyhttps://www.fda.gov/media/136312/download Fact Sheet for Healthcare Providers: https://pope.com/https://www.fda.gov/media/136313/download This test is not yet approved or cleared by the [place] FDA and has been authorized for detection and/or diagnosis of SARS-CoV-2 by FDA under an Emergency Use Authorization (EUA).  This EUA will remain in effect (meaning this test can be used)  for the duration of the COVID-19 declaration under Section 564(b)(1) of the Act, 21 U.S.C. section 360bbb-3(b)(1), unless the authorization is terminated or revoked sooner. Performed at [facility], [address] [place], [place] [address]   Surgical PCR screen     Status: None   Collection Time: 01/31/19  4:50 AM   Specimen: Nasal Mucosa; Nasal Swab  Result Value Ref Range Status   MRSA, PCR NEGATIVE NEGATIVE Final   Staphylococcus aureus NEGATIVE NEGATIVE Final    Comment: (NOTE) The Xpert SA Assay (FDA approved for NASAL specimens in patients 59 years of age and older), is one component of a comprehensive surveillance program. It is not intended to diagnose infection  nor to guide or monitor treatment. Performed at [facility], [address] [place], [place] [address]      Studies: Dg Ankle Complete Right  Result Date: 01/30/2019 CLINICAL DATA:  Pain after fall EXAM: RIGHT ANKLE - COMPLETE 3+ VIEW COMPARISON:  None. FINDINGS: A fracture of the distal fibula demonstrates only mild displacement. Moderate displacement is seen in the distal tibial diaphysis fracture. The ankle mortise is intact. No other acute abnormalities identified. 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 IMPRESSION: 1. Fractures of the distal fibula and tibia as described above. 2. 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 Electronically Signed   By: Gerome Samavid  [name] III M.D   On: 01/30/2019 14:33   Dg Chest Port 1 View  Result Date: 01/30/2019 CLINICAL DATA:  Fall, hypertension. EXAM: PORTABLE CHEST 1 VIEW COMPARISON:  Radiograph of January 26, 2016. FINDINGS: The heart size and mediastinal contours are within normal limits. Both lungs are clear. No pneumothorax or pleural effusion is noted. The visualized skeletal structures are unremarkable. IMPRESSION: No active disease. Electronically Signed   By: Lupita RaiderJames  Green Jr M.D.   On: 01/30/2019 16:35   Scheduled Meds: . [MAR Hold] folic acid  1 mg Oral Daily  . [MAR Hold] LORazepam  0-4 mg Intravenous Q6H   Followed by  . [MAR Hold] LORazepam  0-4 mg Intravenous Q12H  . [MAR Hold] multivitamin with minerals  1 tablet Oral Daily  . [MAR Hold] nicotine  21 mg Transdermal Q24H  . [MAR Hold] pantoprazole  40 mg Oral BID  . [MAR Hold] senna  1 tablet Oral BID  . [MAR Hold] sucralfate  1 g Oral TID WC & HS  . [MAR Hold] thiamine  100 mg Oral Daily   Or  . [MAR Hold] thiamine  100 mg Intravenous Daily   Continuous Infusions: .  ceFAZolin (ANCEF) IV    . dextrose 5 % and 0.9% NaCl 35 mL/hr at 01/31/19 1145  . [MAR Hold] methocarbamol (ROBAXIN) IV 500 mg (01/30/19 2332)    Active Problems:   Alcohol  abuse   Chest pain   Leukocytosis   Tobacco abuse   Closed fracture of right tibia and fibula   Fall due to stumbling   Seizure disorder (HCC)  Time spent:   Standley Dakinslanford Johnson, MD [facility] 02/01/2019, 11:42 AM    LOS: 2 days  How to contact the [facility] Attending or Consulting provider 7A - 7P or covering provider during after hours 7P -7A, for this patient?  1. Check the care team in [facility] and look for a) attending/consulting [facility] provider listed and b) the [facility] team listed 2. Log into [email or web address] and use [facility]'s universal password to access. If you do not have the password, please contact the hospital operator.  3. Locate the [facility] provider you are looking for under [facility] and page to a number that you can be directly reached. 4. If you still have difficulty reaching the provider, please page the [facility] (Director on Call) for the Hospitalists listed on amion for assistance.

## 2019-02-01 NOTE — Op Note (Signed)
NAME: Jerry Humphrey, Jerry E. MEDICAL RECORD [id number] ACCOUNT [id number] DATE OF BIRTH:1960-11-11 FACILITY: [facility] LOCATION: [facility] PHYSICIAN:STEVEN Russ Halo. Maritta Kief, MD  OPERATIVE REPORT  DATE OF PROCEDURE:  02/01/2019  PREOPERATIVE DIAGNOSIS:  Displaced right distal tibia and distal fibula fracture.  POSTOPERATIVE DIAGNOSIS:  Displaced right distal tibia and distal fibula fracture.  PROCEDURE PERFORMED:  Open reduction internal fixation of right distal displaced fibular fracture using Biomet plate with closed intramedullary nailing of displaced distal tibia fracture using Biomet VersaNail.  ATTENDING SURGEON:  Malon KindleSteven Ikia Cincotta, MD  ASSISTANT:  Modesto Charonhomas [name] Dixon, [place], who was scrubbed during the entire procedure and necessary for satisfactory completion of surgery.  ANESTHESIA:  General anesthesia was used.  ESTIMATED BLOOD LOSS:  Minimal.  FLUID REPLACEMENT:  1500 mL crystalloid.  INSTRUMENT COUNTS:  Correct.  COMPLICATIONS:  No complications.  ANTIBIOTICS:  Perioperative antibiotics were given.  TOURNIQUET TIME:  1 hour and 20 minutes at 300 mmHg.  INDICATIONS:  The patient is a 58 year old male who suffered a fall injuring his right distal leg.  The patient had an unstable distal tib-fib fracture.  This was closed and neurovascularly intact.  The patient was brought in for medical workup and  medical clearance prior to surgery to restore stability to his distal tib-fib fracture.  Risks and benefits of surgery discussed in detail with the patient.  Informed consent obtained.  DESCRIPTION OF PROCEDURE:  After an adequate level of anesthesia was achieved, the patient was positioned in the supine position, right leg was correctly identified.  Nonsterile tourniquet placed on proximal thigh with a bump under the right hip.  We  then removed the splint.  A sterile prep and drape performed of the leg and foot.  Time-out called, verifying correct patient, correct site.  We elevated the leg and  exsanguinated with an Esmarch bandage and then elevated the tourniquet to 300 mmHg.  We  performed a longitudinal incision over the distal fibula full thickness down to the periosteum.  Subperiosteal dissection performed distal fibular fracture identified.  This was a long oblique fracture amenable to interfragmentary compression and also  neutralization/stabilization with a plate.  We reduced the fracture using longitudinally in-line traction with a crab claw for direct reduction.  We were able to obtain an anatomic reduction.  We then placed an anterior to posterior, proximal to distal  lag screw using standard AO technique.  We had good compression at the fracture site.  The patient's fracture would not allow a lateral neutralization plate.  Thus, we had to do a posterior antiglide plate, which we used a 7-hole plate placed  appropriately and then bicortical screws proximal to the fracture site and then bicortical screws x2 distal to the fracture site, which provided excellent glide protection as well as some neutralization and we were pleased with the alignment of the  fracture, which was anatomic.  The tibial fracture seemed to be appropriately aligned.  We irrigated thoroughly the fibular incision.  We then closed with interrupted 2-0 Vicryl suture with buried knots and then staples for skin.  We then addressed the  tibia using a closed intramedullary technique.  We made a parapatellar arthrotomy laterally at the knee.  We had the knee in extension on bone foam.  Dissection down through subcutaneous tissues using Bovie and scissors.  We identified the peripatellar  tissues and divided that using the Bovie.  We did not enter the knee joint, but stayed just superficial to that.  We found the center point.  C-arm brought in  and then draped into the field and multiplanar C-arm used to identify our starting point, we  placed our guide pin to the appropriate position.  We then used a step cut drill to enter  the proximal tibia.  We then placed a ball-tipped guidewire across the fracture site into the distal tibia.  The alignment was excellent.  We then reamed  sequentially up to 11.5 selecting a 33 cm x 10 mm tibial VersaNail by Biomet.  We went ahead and impacted the tibial nail into the distal plafond careful not to distract the fracture site.  The fracture remained in excellent alignment.  We then placed  our proximal interlock using the guide, which was the distal oblique.  We then removed our insertion handle, placed the knee in extension.  We were able to gain perfect circles distally and using free hand technique, placed 2 distal interlocking screws  were the appropriate length 4.5 mm.  We had anatomic alignment of the fracture.  We irrigated thoroughly.  We then closed our parapatellar arthrotomy with #1 Vicryl suture followed by 2-0 Vicryl for subcutaneous closure and staples for skin.  Sterile  compressive bandage applied followed by a short-leg splint with the patient's foot in neutral.  The patient was awakened and taken to recovery room in stable condition.  TN/NUANCE  D:02/01/2019 T:02/01/2019 JOB:[id number]

## 2019-02-01 NOTE — Anesthesia Postprocedure Evaluation (Signed)
Anesthesia Post Note  Patient: KAM RAHIMI  Procedure(s) Performed: OPEN REDUCTION INTERNAL FIXATION (ORIF) TIBIA/FIBULA FRACTURE (Right Ankle)     Patient location during evaluation: PACU Anesthesia Type: General Level of consciousness: awake Pain management: pain level controlled Vital Signs Assessment: post-procedure vital signs reviewed and stable Cardiovascular status: stable Postop Assessment: no apparent nausea or vomiting Anesthetic complications: no    Last Vitals:  Vitals:   02/01/19 1455 02/01/19 1530  BP: (!) 160/90 (!) 176/99  Pulse: 72 70  Resp: 15 16  Temp: 36.6 C 36.8 C  SpO2: 97% 98%    Last Pain:  Vitals:   02/01/19 1530  TempSrc: Oral  PainSc:           RLE Sensation: Full sensation(Pt feeling toes being touched and can wiggle them) (02/01/19 1539)      Rajiv Parlato

## 2019-02-01 NOTE — Transfer of Care (Signed)
Immediate Anesthesia Transfer of Care Note  Patient: Jerry Humphrey  Procedure(s) Performed: OPEN REDUCTION INTERNAL FIXATION (ORIF) TIBIA/FIBULA FRACTURE (Right Ankle)  Patient Location: PACU  Anesthesia Type:General  Level of Consciousness: awake, alert  and oriented  Airway & Oxygen Therapy: Patient Spontanous Breathing and Patient connected to face mask oxygen  Post-op Assessment: Report given to RN and Post -op Vital signs reviewed and stable  Post vital signs: Reviewed and stable  Last Vitals:  Vitals Value Taken Time  BP 121/79 02/01/19 1327  Temp    Pulse 70 02/01/19 1329  Resp 24 02/01/19 1329  SpO2 100 % 02/01/19 1329  Vitals shown include unvalidated device data.  Last Pain:  Vitals:   02/01/19 0800  TempSrc:   PainSc: 3       Patients Stated Pain Goal: 5 (69/62/95 2841)  Complications: No apparent anesthesia complications

## 2019-02-01 NOTE — Interval H&P Note (Signed)
History and Physical Interval Note:  02/01/2019 10:50 AM  Jerry Humphrey  has presented today for surgery, with the diagnosis of right tibia/fibula fracture.  The various methods of treatment have been discussed with the patient and family. After consideration of risks, benefits and other options for treatment, the patient has consented to  Procedure(s): OPEN REDUCTION INTERNAL FIXATION (ORIF) TIBIA/FIBULA FRACTURE (Right) as a surgical intervention.  The patient's history has been reviewed, patient examined, no change in status, stable for surgery.  I have reviewed the patient's chart and labs.  Questions were answered to the patient's satisfaction.     Augustin Schooling

## 2019-02-01 NOTE — Anesthesia Preprocedure Evaluation (Addendum)
Anesthesia Evaluation  Patient identified by MRN, date of birth, ID band Patient awake    Reviewed: NPO status , Patient's Chart, lab work & pertinent test results  Airway Mallampati: II  TM Distance: >3 FB     Dental   Pulmonary Current Smoker,    breath sounds clear to auscultation       Cardiovascular hypertension,  Rhythm:Regular Rate:Normal     Neuro/Psych Seizures -,  PSYCHIATRIC DISORDERS Hx of Tremors. History noted. CG   GI/Hepatic negative GI ROS, Neg liver ROS,   Endo/Other  negative endocrine ROS  Renal/GU negative Renal ROS     Musculoskeletal   Abdominal   Peds  Hematology   Anesthesia Other Findings   Reproductive/Obstetrics                             Anesthesia Physical Anesthesia Plan  ASA: III  Anesthesia Plan: General   Post-op Pain Management:    Induction: Intravenous  PONV Risk Score and Plan: 1 and Ondansetron and Midazolam  Airway Management Planned: Oral ETT  Additional Equipment:   Intra-op Plan:   Post-operative Plan: Extubation in OR  Informed Consent: I have reviewed the patients History and Physical, chart, labs and discussed the procedure including the risks, benefits and alternatives for the proposed anesthesia with the patient or authorized representative who has indicated his/her understanding and acceptance.     Dental advisory given  Plan Discussed with: CRNA and Anesthesiologist  Anesthesia Plan Comments:         Anesthesia Quick Evaluation

## 2019-02-01 NOTE — Anesthesia Procedure Notes (Signed)
Procedure Name: Intubation Date/Time: 02/01/2019 11:27 AM Performed by: Niel Hummer, CRNA Pre-anesthesia Checklist: Patient being monitored, Suction available, Emergency Drugs available and Patient identified Patient Re-evaluated:Patient Re-evaluated prior to induction Oxygen Delivery Method: Circle system utilized Preoxygenation: Pre-oxygenation with 100% oxygen Induction Type: IV induction and Rapid sequence Laryngoscope Size: Mac and 4 Grade View: Grade I Tube size: 7.5 mm Number of attempts: 1 Airway Equipment and Method: Stylet Placement Confirmation: ETT inserted through vocal cords under direct vision,  positive ETCO2 and breath sounds checked- equal and bilateral Secured at: 23 cm Tube secured with: Tape Dental Injury: Teeth and Oropharynx as per pre-operative assessment

## 2019-02-02 ENCOUNTER — Encounter (HOSPITAL_COMMUNITY): Payer: Self-pay | Admitting: Orthopedic Surgery

## 2019-02-02 LAB — BASIC METABOLIC PANEL
Anion gap: 10 (ref 5–15)
BUN: 10 mg/dL (ref 6–20)
CO2: 24 mmol/L (ref 22–32)
Calcium: 8.5 mg/dL — ABNORMAL LOW (ref 8.9–10.3)
Chloride: 100 mmol/L (ref 98–111)
Creatinine, Ser: 0.95 mg/dL (ref 0.61–1.24)
GFR calc Af Amer: >60 mL/min (ref >60)
GFR calc non Af Amer: >60 mL/min (ref >60)
Glucose, Bld: 177 mg/dL — ABNORMAL HIGH (ref 70–99)
Potassium: 3.9 mmol/L (ref 3.5–5.1)
Sodium: 134 mmol/L — ABNORMAL LOW (ref 135–145)

## 2019-02-02 LAB — CBC
HCT: 37.1 % — ABNORMAL LOW (ref 39.0–52.0)
Hemoglobin: 12.3 g/dL — ABNORMAL LOW (ref 13.0–17.0)
MCH: 31.1 pg (ref 26.0–34.0)
MCHC: 33.2 g/dL (ref 30.0–36.0)
MCV: 93.7 fL (ref 80.0–100.0)
Platelets: 223 10*3/uL (ref 150–400)
RBC: 3.96 MIL/uL — ABNORMAL LOW (ref 4.22–5.81)
RDW: 11.7 % (ref 11.5–15.5)
WBC: 10.8 10*3/uL — ABNORMAL HIGH (ref 4.0–10.5)
nRBC: 0 % (ref 0.0–0.2)

## 2019-02-02 MED ORDER — HYDRALAZINE HCL 20 MG/ML IJ SOLN
10.0000 mg | INTRAMUSCULAR | Status: DC | PRN
  Administered 2019-02-02 – 2019-02-05 (×3): 10 mg via INTRAVENOUS
  Filled 2019-02-02 (×3): qty 1

## 2019-02-02 NOTE — Progress Notes (Signed)
PROGRESS NOTE  Jerry PickingJoseph E Tung  ZOX:[id number]  DOB: [birth year]  DOA: 01/30/2019 PCP: Patient, [name]  Brief Admission Hx: 58 year old male with hypertension, tobacco, alcohol, and question of remote seizures presented after a fall with an acute right tib-fib fracture.  MDM/Assessment & Plan:   1. Acute right tib-fib fracture- this injury required operative management.  Orthopedics has been consulted.  The patient went to the OR 02/01/2019 and is POD#1.  Post op mgmt per ortho team.  Pt awaiting PT evaluation.  Pt hopeful for SNF placement.  2. History of alcoholism- patient denies history of withdrawal, we are monitoring him on CIWA protocol. 3. Atypical chest pain-RESOLVED.  Was likely secondary to GERD-high-sensitivity troponins have been negligible x3.  He had a normal 2D echocardiogram for completeness.  EKG with no acute or worrisome findings.  His A1c is normal.  His lipids are okay. 4. Reactive leukocytosis-WBC slightly bumped post surgery. 5. Tobacco/alcohol use-patient counseled to avoid tobacco and alcohol especially since it could delay wound healing.  He verbalized understanding.  Will offer nicotine patch while he is in the hospital. 6. Remote history of seizure - pt not taking antiepileptics.  I wonder if he had alcohol withdrawal seizure in the past? He is not sure.    DVT prophylaxis: SCDs Code Status: Full Family Communication: Patient updated at bedside, emergency contact number not in service Disposition Plan: should be medically ready to discharge 7/14    Consultants:  orthopedics  Procedures:  OR 02/01/19  Echocardiogram 01/31/19 IMPRESSIONS  1. The left ventricle has normal systolic function with an ejection fraction of 60-65%. The cavity size was normal. There is mild asymmetric left ventricular hypertrophy. Left ventricular diastolic Doppler parameters are consistent with impaired  relaxation.  2. The right ventricle has normal systolic function. The  cavity was normal. There is no increase in right ventricular wall thickness.  3. Mild aortic annular calcification noted.  Antimicrobials:     Subjective: Pt c/o of right ankle pain and discomfort.  Has not been up with PT yet.    Objective: Vitals:   02/02/19 0017 02/02/19 0505 02/02/19 0512 02/02/19 0558  BP: (!) 174/105 (!) 159/103 (!) 159/103 (!) 147/90  Pulse: 97 95 95 93  Resp: 18   18  Temp: 98.7 F (37.1 C)   98.7 F (37.1 C)  TempSrc: Oral   Oral  SpO2: 98%   96%  Weight:      Height:        Intake/Output Summary (Last 24 hours) at 02/02/2019 1058 Last data filed at 02/02/2019 0831 Gross per 24 hour  Intake 2152.23 ml  Output 2675 ml  Net -522.77 ml   Filed Weights   02/01/19 0900  Weight: 82.6 kg   REVIEW OF SYSTEMS  As per history otherwise all reviewed and reported negative  Exam:  General exam: awake, alert, NAD, cooperative.  Respiratory system: Clear. No increased work of breathing. Cardiovascular system: S1 & S2 heard. No JVD, murmurs, gallops, clicks or pedal edema. Gastrointestinal system: Abdomen is nondistended, soft and nontender. Normal bowel sounds heard. Central nervous system: Alert and oriented. No focal neurological deficits. Extremities: right ankle wrapped in gauze, pedal pulses palpated bilateral. Bandages clean and dry intact.   Data Reviewed: Basic Metabolic Panel: Recent Labs  Lab 01/30/19 1526 01/31/19 0121 02/01/19 1845 02/02/19 0544  NA 137 135  --  134*  K 4.8 4.8  --  3.9  CL 103 101  --  100  CO2  21* 19*  --  24  GLUCOSE 76 68*  --  177*  BUN 13 18  --  10  CREATININE 1.15 1.08 1.19 0.95  CALCIUM 9.0 9.0  --  8.5*   Liver Function Tests: Recent Labs  Lab 01/30/19 1526  AST 107*  ALT 125*  ALKPHOS 68  BILITOT 0.8  PROT 8.9*  ALBUMIN 4.7   No results for input(s): LIPASE, AMYLASE in the last 168 hours. No results for input(s): AMMONIA in the last 168 hours. CBC: Recent Labs  Lab 01/30/19 1526  01/31/19 0121 02/01/19 0455 02/02/19 0544  WBC 14.5* 10.9* 5.8 10.8*  NEUTROABS 11.0*  --   --   --   HGB 15.1 14.0 12.5* 12.3*  HCT 45.2 43.6 39.1 37.1*  MCV 93.8 94.6 95.4 93.7  PLT 297 281 216 223   Cardiac Enzymes: No results for input(s): CKTOTAL, CKMB, CKMBINDEX, TROPONINI in the last 168 hours. CBG (last 3)  Recent Labs    01/31/19 0601 01/31/19 0634  GLUCAP 69* 141*   Recent Results (from the past 240 hour(s))  SARS Coronavirus 2 (CEPHEID - [facility]formed [facility]), Hosp Order     Status: None   Collection Time: 01/30/19  4:00 PM   Specimen: Nasopharyngeal Swab  Result Value Ref Range Status   SARS Coronavirus 2 NEGATIVE NEGATIVE Final    Comment: (NOTE) If result is NEGATIVE SARS-CoV-2 target nucleic acids are NOT DETECTED. The SARS-CoV-2 RNA is generally detectable in upper and lower  respiratory specimens during the acute phase of infection. The lowest  concentration of SARS-CoV-2 viral copies this assay can detect is 250  copies / mL. A negative result does not preclude SARS-CoV-2 infection  and should not be used as the sole basis for treatment or other  patient management decisions.  A negative result may occur with  improper specimen collection / handling, submission of specimen other  than nasopharyngeal swab, presence of viral mutation(s) within the  areas targeted by this assay, and inadequate number of viral copies  (<250 copies / mL). A negative result must be combined with clinical  observations, patient history, and epidemiological information. If result is POSITIVE SARS-CoV-2 target nucleic acids are DETECTED. The SARS-CoV-2 RNA is generally detectable in upper and lower  respiratory specimens dur ing the acute phase of infection.  Positive  results are indicative of active infection with SARS-CoV-2.  Clinical  correlation with patient history and other diagnostic information is  necessary to determine patient infection status.   Positive results do  not rule out bacterial infection or co-infection with other viruses. If result is PRESUMPTIVE POSTIVE SARS-CoV-2 nucleic acids MAY BE PRESENT.   A presumptive positive result was obtained on the submitted specimen  and confirmed on repeat testing.  While 2019 novel coronavirus  (SARS-CoV-2) nucleic acids may be present in the submitted sample  additional confirmatory testing may be necessary for epidemiological  and / or clinical management purposes  to differentiate between  SARS-CoV-2 and other Sarbecovirus currently known to infect humans.  If clinically indicated additional testing with an alternate test  methodology [phone] is advised. The SARS-CoV-2 RNA is generally  detectable in upper and lower respiratory sp ecimens during the acute  phase of infection. The expected result is Negative. Fact Sheet for Patients:  BoilerBrush.com.cyhttps://www.fda.gov/media/136312/download Fact Sheet for Healthcare Providers: https://pope.com/https://www.fda.gov/media/136313/download This test is not yet approved or cleared by the [place] FDA and has been authorized for detection and/or diagnosis of SARS-CoV-2 by  FDA under an Emergency Use Authorization (EUA).  This EUA will remain in effect (meaning this test can be used) for the duration of the COVID-19 declaration under Section 564(b)(1) of the Act, 21 U.S.C. section 360bbb-3(b)(1), unless the authorization is terminated or revoked sooner. Performed at [facility], [place] [address], [place], [place] [address]   Surgical PCR screen     Status: None   Collection Time: 01/31/19  4:50 AM   Specimen: Nasal Mucosa; Nasal Swab  Result Value Ref Range Status   MRSA, PCR NEGATIVE NEGATIVE Final   Staphylococcus aureus NEGATIVE NEGATIVE Final    Comment: (NOTE) The Xpert SA Assay (FDA approved for NASAL specimens in patients 65 years of age and older), is one component of a comprehensive surveillance program. It is not intended to  diagnose infection nor to guide or monitor treatment. Performed at [facility], [place] [address] [place], [place] [address]      Studies: Dg Tibia/fibula Right  Result Date: 02/01/2019 CLINICAL DATA:  Distal tibia and fibular fractures.  ORIF. EXAM: RIGHT TIBIA AND FIBULA - 2 VIEW; DG C-ARM 1-60 MIN-NO REPORT COMPARISON:  Left ankle radiograph 01/30/2019 FINDINGS: Posterolateral plate and screw fixation is present the distal fibula. The fracture is reduced. Intramedullary rod is placed in the femur. There are 2 distal and 1 proximal interlocking screws. There is near anatomic reduction with minimal residual medial displacement. IMPRESSION: 1. Near anatomic reduction of distal tibia and fibular fractures. No radiographic evidence for complication. Electronically Signed   By: San Morelle M.D.   On: 02/01/2019 14:58   Dg C-arm 1-60 Min-no Report  Result Date: 02/01/2019 CLINICAL DATA:  Distal tibia and fibular fractures.  ORIF. EXAM: RIGHT TIBIA AND FIBULA - 2 VIEW; DG C-ARM 1-60 MIN-NO REPORT COMPARISON:  Left ankle radiograph 01/30/2019 FINDINGS: Posterolateral plate and screw fixation is present the distal fibula. The fracture is reduced. Intramedullary rod is placed in the femur. There are 2 distal and 1 proximal interlocking screws. There is near anatomic reduction with minimal residual medial displacement. IMPRESSION: 1. Near anatomic reduction of distal tibia and fibular fractures. No radiographic evidence for complication. Electronically Signed   By: San Morelle M.D.   On: 02/01/2019 14:58   Scheduled Meds: . docusate sodium  100 mg Oral BID  . enoxaparin (LOVENOX) injection  40 mg Subcutaneous Q24H  . folic acid  1 mg Oral Daily  . LORazepam  0-4 mg Intravenous Q12H  . LORazepam  0-4 mg Intravenous Q12H  . multivitamin with minerals  1 tablet Oral Daily  . nicotine  21 mg Transdermal Q24H  . pantoprazole  40 mg Oral BID  . senna  1 tablet Oral BID   . sucralfate  1 g Oral TID WC & HS  . thiamine  100 mg Oral Daily   Or  . thiamine  100 mg Intravenous Daily   Continuous Infusions: . sodium chloride 50 mL/hr at 02/01/19 1631  . methocarbamol (ROBAXIN) IV 500 mg (01/30/19 2332)    Active Problems:   Alcohol abuse   Chest pain   Leukocytosis   Tobacco abuse   Closed fracture of right tibia and fibula   Fall due to stumbling   Seizure disorder ([place])   Tibia/fibula fracture  Time spent:   Irwin Brakeman, MD [facility] 02/02/2019, 10:58 AM    LOS: 3 days  How to contact the [facility] Attending or Consulting provider Reserve or covering provider during after hours  7P -7A, for this patient?  1. Check the care team in [facility] and look for a) attending/consulting TR[facility] provider listed and b) the [facility] team listed 2. Log into [email or web address] and use [place]'s universal password to access. If you do not have the password, please contact the hospital operator. 3. Locate the [facility] provider you are looking for under [facility] and page to a number that you can be directly reached. 4. If you still have difficulty reaching the provider, please page the [facility] (Director on Call) for the Hospitalists listed on amion for assistance.

## 2019-02-02 NOTE — Evaluation (Signed)
Physical Therapy Evaluation Patient Details Name: Jerry PickingJoseph E Humphrey MRN: [id number] DOB: 1961/07/05 Today's Date: 02/02/2019   History of Present Illness  58 y.o. male with medical history significant of ETOH use, seizure. Fall due to stumbling over a tree stump with ankle deformity-Closed fracture of right tibia and fibula s/p ORIFdistal displaced tib/fibular fracture 7/12  Clinical Impression  On eval, pt required Min-Mod assist for mobility. He was able to take a few hopping steps in the room. He required some cueing to adhere to NWB status. Moderate pain with activity-pt c/o more pain close to knee then ankle. At this time, recommendation is for SNF. Will continue to follow and progress activity as tolerated.     Follow Up Recommendations SNF    Equipment Recommendations  Wheelchair;3in1;Rolling walker with 5" wheels    Recommendations for Other Services       Precautions / Restrictions Precautions Precautions: Fall Required Braces or Orthoses: Splint/Cast Splint/Cast: R lower leg Restrictions Weight Bearing Restrictions: Yes RLE Weight Bearing: Non weight bearing      Mobility  Bed Mobility Overal bed mobility: Needs Assistance Bed Mobility: Sit to Supine     Supine to sit: Supervision Sit to supine: Min guard   General bed mobility comments: close guard for safety, lines.  Transfers Overall transfer level: Needs assistance Equipment used: Rolling walker (2 wheeled) Transfers: Sit to/from [employer]GI CorporationStand;Stand Pivot Transfers Sit to Stand: Mod assist Stand pivot transfers: Min assist       General transfer comment: Assist to rise, stabilize, control descent. Multimodal cueing for safety, technique, LE placement, adherence to NWB  Ambulation/Gait Ambulation/Gait assistance: Min assist Gait Distance (Feet): 4 Feet Assistive device: Rolling walker (2 wheeled) Gait Pattern/deviations: Step-to pattern     General Gait Details: VCs safety, technique, sequence, adherence  to NWB. Assist to stabilize and maneuver safely with RW. Followed closely with recliner.  Stairs            Wheelchair Mobility    Modified Rankin (Stroke Patients Only)       Balance Overall balance assessment: Needs assistance Sitting-balance support: No upper extremity supported;Feet supported Sitting balance-Leahy Scale: Good     Standing balance support: Bilateral upper extremity supported Standing balance-Leahy Scale: Poor                               Pertinent Vitals/Pain Pain Assessment: Faces Pain Score: 9  Faces Pain Scale: Hurts even more Pain Location: R LE Pain Descriptors / Indicators: Aching;Sore;Throbbing Pain Intervention(s): Repositioned;Monitored during session;Limited activity within patient's tolerance    Home Living Family/patient expects to be discharged to:: Skilled nursing facility Living Arrangements: Alone   Type of Home: Mobile home Home Access: Stairs to enter Entrance Stairs-Rails: Right Entrance Stairs-Number of Steps: 3 Home Layout: One level Home Equipment: None      Prior Function Level of Independence: Independent               Hand Dominance   Dominant Hand: Right    Extremity/Trunk Assessment   Upper Extremity Assessment Upper Extremity Assessment: Defer to OT evaluation    Lower Extremity Assessment Lower Extremity Assessment: Generalized weakness;RLE deficits/detail RLE Deficits / Details: lower leg in splint. Hip flex at least 3/5. RLE: Unable to fully assess due to immobilization       Communication   Communication: No difficulties  Cognition Arousal/Alertness: Awake/alert(appears drowsy) Behavior During Therapy: WFL for tasks assessed/performed Overall Cognitive Status: Within Functional  Limits for tasks assessed                                        General Comments      Exercises     Assessment/Plan    PT Assessment Patient needs continued PT services  PT  Problem List Decreased strength;Decreased mobility;Decreased range of motion;Decreased activity tolerance;Decreased balance;Decreased knowledge of use of DME;Pain;Decreased knowledge of precautions       PT Treatment Interventions DME instruction;Gait training;Therapeutic exercise;Therapeutic activities;Patient/family education;Functional mobility training;Balance training    PT Goals (Current goals can be found in the Care Plan section)  Acute Rehab PT Goals Patient Stated Goal: to go to rehab then home, I can't manage by myself at home PT Goal Formulation: With patient Time For Goal Achievement: 02/16/19 Potential to Achieve Goals: Good    Frequency Min 3X/week   Barriers to discharge        Co-evaluation               AM-PAC PT "6 Clicks" Mobility  Outcome Measure Help needed turning from your back to your side while in a flat bed without using bedrails?: A Little Help needed moving from lying on your back to sitting on the side of a flat bed without using bedrails?: A Little Help needed moving to and from a bed to a chair (including a wheelchair)?: A Little Help needed standing up from a chair using your arms (e.g., wheelchair or bedside chair)?: A Lot Help needed to walk in hospital room?: A Lot Help needed climbing 3-5 steps with a railing? : Total 6 Click Score: 14    End of Session Equipment Utilized During Treatment: Gait belt Activity Tolerance: Patient limited by pain;Patient limited by fatigue Patient left: in bed;with call bell/phone within reach;with bed alarm set   PT Visit Diagnosis: Unsteadiness on feet (R26.81);Muscle weakness (generalized) (M62.81);History of falling (Z91.81);Pain Pain - Right/Left: Right Pain - part of body: Leg    Time: 1135-1157 PT Time Calculation (min) (ACUTE ONLY): 22 min   Charges:   PT Evaluation $PT Eval Moderate Complexity: [place], PT Acute Rehabilitation Services Pager:  [phone] Office: [phone]

## 2019-02-02 NOTE — Evaluation (Signed)
Occupational Therapy Evaluation Patient Details Name: Jerry Humphrey MRN: [id number] DOB: [birth year] Today's Date: 02/02/2019    History of Present Illness Jerry Humphrey is a 58 y.o. male with medical history significant of ETOH use, seizure--not on any medicaitons, HTN, tobacco abuse.Fall due to stumbling over a tree stump with ankle deformity-Closed fracture of right tibia and fibula s/p ORIFdistal displaced tib/fibular fracture   Clinical Impression   This 58 yo male admitted and underwent above presents to acute OT with increased pain, decreased mobility, decreased balance and lives alone, thus affecting his safety and independence with basic ADLs. He will benefit from acute OT with follow up OT at SNF.    Follow Up Recommendations  SNF;Supervision/Assistance - 24 hour    Equipment Recommendations  (TBD next venue)       Precautions / Restrictions Precautions Precautions: Fall Restrictions Weight Bearing Restrictions: Yes RLE Weight Bearing: Non weight bearing      Mobility Bed Mobility Overal bed mobility: Needs Assistance Bed Mobility: Supine to Sit     Supine to sit: Supervision        Transfers Overall transfer level: Needs assistance Equipment used: Rolling walker (2 wheeled) Transfers: Sit to/from [facility] Sit to Stand: Min assist Stand pivot transfers: Min assist            Balance Overall balance assessment: Needs assistance Sitting-balance support: No upper extremity supported;Feet supported Sitting balance-Leahy Scale: Good     Standing balance support: Bilateral upper extremity supported Standing balance-Leahy Scale: Poor                             ADL either performed or assessed with clinical judgement   ADL Overall ADL's : Needs assistance/impaired Eating/Feeding: Independent;Sitting   Grooming: Set up;Sitting   Upper Body Bathing: Set up;Sitting   Lower Body Bathing: Moderate assistance Lower  Body Bathing Details (indicate cue type and reason): min A sit<>stand,but Mod A to maintain standing to attempt wash back/front peri care Upper Body Dressing : Set up;Sitting   Lower Body Dressing: Moderate assistance Lower Body Dressing Details (indicate cue type and reason): min A sit<>stand,but Mod A to maintain standing to attempt to simulate pulling up pants Toilet Transfer: Minimal assistance;Stand-pivot;RW Toilet Transfer Details (indicate cue type and reason): hop Toileting- Clothing Manipulation and Hygiene: Moderate assistance Toileting - Clothing Manipulation Details (indicate cue type and reason): min A sit<>stand,but Mod A to maintain standing to attempt wash back/front peri care/clothing management             Vision Patient Visual Report: No change from baseline              Pertinent Vitals/Pain Pain Assessment: 0-10 Pain Score: 9  Pain Location: RLE Pain Descriptors / Indicators: Aching;Sore;Throbbing Pain Intervention(s): Limited activity within patient's tolerance;Monitored during session;Repositioned(asked pt if wanted to me ask RN about pain meds and he said no, they do not help)     Hand Dominance Right   Extremity/Trunk Assessment Upper Extremity Assessment Upper Extremity Assessment: Overall WFL for tasks assessed           Communication Communication Communication: No difficulties   Cognition Arousal/Alertness: Awake/alert Behavior During Therapy: Anxious Overall Cognitive Status: Within Functional Limits for tasks assessed  Home Living Family/patient expects to be discharged to:: Skilled nursing facility Living Arrangements: Alone   Type of Home: Mobile home Home Access: Stairs to enter Entrance Stairs-Number of Steps: 3 Entrance Stairs-Rails: Right Home Layout: One level     Bathroom Shower/Tub: Chief Strategy OfficerTub/shower unit   Bathroom Toilet: Standard     Home Equipment:  None          Prior Functioning/Environment Level of Independence: Independent                 OT Problem List: Decreased strength;Decreased range of motion;Impaired balance (sitting and/or standing);Pain      OT Treatment/Interventions: Self-care/ADL training;DME and/or AE instruction;Balance training;Therapeutic activities    OT Goals(Current goals can be found in the care plan section) Acute Rehab OT Goals Patient Stated Goal: to go to rehab then home, I can't manage by myself at home OT Goal Formulation: With patient Time For Goal Achievement: 02/16/19 Potential to Achieve Goals: Good  OT Frequency: Min 2X/week   Barriers to D/C: Decreased caregiver support             AM-PAC OT "6 Clicks" Daily Activity     Outcome Measure Help from another person eating meals?: None Help from another person taking care of personal grooming?: A Little Help from another person toileting, which includes using toliet, bedpan, or urinal?: A Lot Help from another person bathing (including washing, rinsing, drying)?: A Lot Help from another person to put on and taking off regular upper body clothing?: A Little Help from another person to put on and taking off regular lower body clothing?: A Lot 6 Click Score: 16   End of Session Equipment Utilized During Treatment: Gait belt;Rolling walker Nurse Communication: Mobility status  Activity Tolerance: Patient tolerated treatment well Patient left: in chair;with call bell/phone within reach;with chair alarm set  OT Visit Diagnosis: Unsteadiness on feet (R26.81);Other abnormalities of gait and mobility (R26.89);Muscle weakness (generalized) (M62.81);Pain Pain - Right/Left: Right Pain - part of body: Leg                Time: [id number] OT Time Calculation (min): 36 min Charges:  OT General Charges $OT Visit: 1 Visit OT Evaluation $OT Eval Moderate Complexity: 1 Mod OT Treatments $Self Care/Home Management : 8-22 mins  Ignacia Palmaathy  Verdie Wilms, OTR/L [facility] Pager [phone] Office [phone]    Evette GeorgesLeonard, Tonna Palazzi Eva 02/02/2019, 9:59 AM

## 2019-02-02 NOTE — Progress Notes (Signed)
   Subjective: 1 Day Post-Op Procedure(s) (LRB): OPEN REDUCTION INTERNAL FIXATION (ORIF) TIBIA/FIBULA FRACTURE (Right)  Pt c/o mild to moderate pain in the leg s/p surgery Denies any numbness or tingling distally Otherwise no new symptoms or issues Patient reports pain as moderate.  Objective:   VITALS:   Vitals:   02/02/19 0558 02/02/19 1324  BP: (!) 147/90 (!) 143/88  Pulse: 93 (!) 102  Resp: 18 18  Temp: 98.7 F (37.1 C) 98.5 F (36.9 C)  SpO2: 96% 97%    Right lower extremity: currently in splint nv intact distally No signs of drainage Currently non weight bearing  LABS Recent Labs    01/31/19 0121 02/01/19 0455 02/02/19 0544  HGB 14.0 12.5* 12.3*  HCT 43.6 39.1 37.1*  WBC 10.9* 5.8 10.8*  PLT 281 216 223    Recent Labs    01/30/19 1526 01/31/19 0121 02/01/19 1845 02/02/19 0544  NA 137 135  --  134*  K 4.8 4.8  --  3.9  BUN 13 18  --  10  CREATININE 1.15 1.08 1.19 0.95  GLUCOSE 76 68*  --  177*     Assessment/Plan: 1 Day Post-Op Procedure(s) (LRB): OPEN REDUCTION INTERNAL FIXATION (ORIF) TIBIA/FIBULA FRACTURE (Right) Continue PT/OT Non weight bearing right lower extremity Pain management as needed Will continue to monitor his progress     Merla Riches PA-C, [facility] is now [employer] [address] [place] [place], [place], [place] [address] Phone: [phone] [email or web address]  [employer]

## 2019-02-03 LAB — CBC
HCT: 37 % — ABNORMAL LOW (ref 39.0–52.0)
Hemoglobin: 12.2 g/dL — ABNORMAL LOW (ref 13.0–17.0)
MCH: 31 pg (ref 26.0–34.0)
MCHC: 33 g/dL (ref 30.0–36.0)
MCV: 94.1 fL (ref 80.0–100.0)
Platelets: 220 10*3/uL (ref 150–400)
RBC: 3.93 MIL/uL — ABNORMAL LOW (ref 4.22–5.81)
RDW: 11.7 % (ref 11.5–15.5)
WBC: 9.5 10*3/uL (ref 4.0–10.5)
nRBC: 0 % (ref 0.0–0.2)

## 2019-02-03 MED ORDER — OXYCODONE HCL 5 MG PO TABS
10.0000 mg | ORAL_TABLET | Freq: Four times a day (QID) | ORAL | Status: DC | PRN
  Administered 2019-02-03 – 2019-02-07 (×12): 10 mg via ORAL
  Filled 2019-02-03 (×13): qty 2

## 2019-02-03 MED ORDER — HYDROMORPHONE HCL 1 MG/ML IJ SOLN
0.5000 mg | INTRAMUSCULAR | Status: DC | PRN
  Administered 2019-02-03 – 2019-02-07 (×11): 0.5 mg via INTRAVENOUS
  Filled 2019-02-03 (×11): qty 0.5

## 2019-02-03 MED ORDER — AMLODIPINE BESYLATE 5 MG PO TABS
5.0000 mg | ORAL_TABLET | Freq: Every day | ORAL | Status: DC
  Administered 2019-02-03 – 2019-02-16 (×14): 5 mg via ORAL
  Filled 2019-02-03 (×14): qty 1

## 2019-02-03 NOTE — Progress Notes (Signed)
   Subjective: 2 Days Post-Op Procedure(s) (LRB): OPEN REDUCTION INTERNAL FIXATION (ORIF) TIBIA/FIBULA FRACTURE (Right)  Pt c/o continued moderate pain in the leg worse with therapy Denies any new symptoms other than pain D/c planning Patient reports pain as moderate.  Objective:   VITALS:   Vitals:   02/02/19 2351 02/03/19 0620  BP: (!) 152/89 (!) 157/102  Pulse: (!) 101 93  Resp:  16  Temp:  99 F (37.2 C)  SpO2:  96%    Right lower extremity: dressing intact nv intact distally No rashes or edema distally No signs of drainage  LABS Recent Labs    02/01/19 0455 02/02/19 0544 02/03/19 0609  HGB 12.5* 12.3* 12.2*  HCT 39.1 37.1* 37.0*  WBC 5.8 10.8* 9.5  PLT 216 223 220    Recent Labs    02/01/19 1845 02/02/19 0544  NA  --  134*  K  --  3.9  BUN  --  10  CREATININE 1.19 0.95  GLUCOSE  --  177*     Assessment/Plan: 2 Days Post-Op Procedure(s) (LRB): OPEN REDUCTION INTERNAL FIXATION (ORIF) TIBIA/FIBULA FRACTURE (Right) Continue PT/OT Non weight bearing right lower extremity D/c planning Pain management     [name] Luna [place], MPAS [facility] is now [facility]  [place] [address], [place], [place] [address] Phone: [phone] [email or web address] Facebook  [employer]

## 2019-02-03 NOTE — Progress Notes (Signed)
PROGRESS NOTE  Jerry Humphrey  YQM:[id number]  DOB: 26-Sep-1960  DOA: 01/30/2019 PCP: Patient, [name]  Brief Admission Hx: 58 year old male with hypertension, tobacco, alcohol, and question of remote seizures presented after a fall with an acute right tib-fib fracture.  MDM/Assessment & Plan:   1. Acute right tib-fib fracture- this injury required operative management.  Orthopedics has been consulted.  The patient went to the OR 02/01/2019 and is POD#2.  Post op mgmt per ortho team.  PT recommending SNF.  Pt hopeful for SNF placement. Case Management working on placement.   2. History of alcoholism- patient denies history of withdrawal, we are monitoring him on CIWA protocol but he has not had any withdrawal. 3. Atypical chest pain-RESOLVED.  Was likely secondary to GERD-high-sensitivity troponins have been negligible x3.  He had a normal 2D echocardiogram for completeness.  EKG with no acute or worrisome findings.  His A1c is normal.  His lipids are okay. 4. Reactive leukocytosis-WBC trended down. 5. Tobacco/alcohol use-patient counseled to avoid tobacco and alcohol especially since it could delay wound healing.  He verbalized understanding.  Will offer nicotine patch while he is in the hospital. 6. Remote history of seizure - pt not taking antiepileptics.  I wonder if he had alcohol withdrawal seizure in the past?   7. Essential hypertension - blood pressures not well controlled, added amlodipine 5 mg daily.  Continue hydralazine IV as needed.   DVT prophylaxis: SCDs Code Status: Full Family Communication: Patient updated at bedside, emergency contact number not in service Disposition Plan: should be medically ready to discharge 7/15, awaiting SNF bed offer and placement   Consultants:  orthopedics  Procedures:  OR 02/01/19  Echocardiogram 01/31/19 IMPRESSIONS  1. The left ventricle has normal systolic function with an ejection fraction of 60-65%. The cavity size was normal.  There is mild asymmetric left ventricular hypertrophy. Left ventricular diastolic Doppler parameters are consistent with impaired  relaxation.  2. The right ventricle has normal systolic function. The cavity was normal. There is no increase in right ventricular wall thickness.  3. Mild aortic annular calcification noted.  Antimicrobials:     Subjective: Pt is more drowsy today.   He has no complaints   Objective: Vitals:   02/02/19 2351 02/03/19 0620 02/03/19 0915 02/03/19 1241  BP: (!) 152/89 (!) 157/102 (!) 154/99 (!) 158/97  Pulse: (!) 101 93 94 100  Resp:  16  20  Temp:  99 F (37.2 C)  99.1 F (37.3 C)  TempSrc:  Oral  Oral  SpO2:  96%  95%  Weight:      Height:        Intake/Output Summary (Last 24 hours) at 02/03/2019 1509 Last data filed at 02/03/2019 0915 Gross per 24 hour  Intake 1453.14 ml  Output 575 ml  Net 878.14 ml   Filed Weights   02/01/19 0900  Weight: 82.6 kg   REVIEW OF SYSTEMS  As per history otherwise all reviewed and reported negative  Exam:  General exam: awake, alert, NAD, cooperative.  Respiratory system: Clear. No increased work of breathing. Cardiovascular system: S1 & S2 heard. No JVD, murmurs, gallops, clicks or pedal edema. Gastrointestinal system: Abdomen is nondistended, soft and nontender. Normal bowel sounds heard. Central nervous system: Alert and oriented. No focal neurological deficits. Extremities: right ankle wrapped in gauze, pedal pulses palpated bilateral. Bandages clean and dry intact.   Data Reviewed: Basic Metabolic Panel: Recent Labs  Lab 01/30/19 1526 01/31/19 0121 02/01/19 1845 02/02/19  0544  NA 137 135  --  134*  K 4.8 4.8  --  3.9  CL 103 101  --  100  CO2 21* 19*  --  24  GLUCOSE 76 68*  --  177*  BUN 13 18  --  10  CREATININE 1.15 1.08 1.19 0.95  CALCIUM 9.0 9.0  --  8.5*   Liver Function Tests: Recent Labs  Lab 01/30/19 1526  AST 107*  ALT 125*  ALKPHOS 68  BILITOT 0.8  PROT 8.9*  ALBUMIN  4.7   No results for input(s): LIPASE, AMYLASE in the last 168 hours. No results for input(s): AMMONIA in the last 168 hours. CBC: Recent Labs  Lab 01/30/19 1526 01/31/19 0121 02/01/19 0455 02/02/19 0544 02/03/19 0609  WBC 14.5* 10.9* 5.8 10.8* 9.5  NEUTROABS 11.0*  --   --   --   --   HGB 15.1 14.0 12.5* 12.3* 12.2*  HCT 45.2 43.6 39.1 37.1* 37.0*  MCV 93.8 94.6 95.4 93.7 94.1  PLT 297 281 216 223 220   Cardiac Enzymes: No results for input(s): CKTOTAL, CKMB, CKMBINDEX, TROPONINI in the last 168 hours. CBG (last 3)  No results for input(s): GLUCAP in the last 72 hours. Recent Results (from the past 240 hour(s))  SARS Coronavirus 2 (CEPHEID - Performed in [facility] hospital lab), Hosp Order     Status: None   Collection Time: 01/30/19  4:00 PM   Specimen: Nasopharyngeal Swab  Result Value Ref Range Status   SARS Coronavirus 2 NEGATIVE NEGATIVE Final    Comment: (NOTE) If result is NEGATIVE SARS-CoV-2 target nucleic acids are NOT DETECTED. The SARS-CoV-2 RNA is generally detectable in upper and lower  respiratory specimens during the acute phase of infection. The lowest  concentration of SARS-CoV-2 viral copies this assay can detect is 250  copies / mL. A negative result does not preclude SARS-CoV-2 infection  and should not be used as the sole basis for treatment or other  patient management decisions.  A negative result may occur with  improper specimen collection / handling, submission of specimen other  than nasopharyngeal swab, presence of viral mutation(s) within the  areas targeted by this assay, and inadequate number of viral copies  (<250 copies / mL). A negative result must be combined with clinical  observations, patient history, and epidemiological information. If result is POSITIVE SARS-CoV-2 target nucleic acids are DETECTED. The SARS-CoV-2 RNA is generally detectable in upper and lower  respiratory specimens dur ing the acute phase of infection.   Positive  results are indicative of active infection with SARS-CoV-2.  Clinical  correlation with patient history and other diagnostic information is  necessary to determine patient infection status.  Positive results do  not rule out bacterial infection or co-infection with other viruses. If result is PRESUMPTIVE POSTIVE SARS-CoV-2 nucleic acids MAY BE PRESENT.   A presumptive positive result was obtained on the submitted specimen  and confirmed on repeat testing.  While 2019 novel coronavirus  (SARS-CoV-2) nucleic acids may be present in the submitted sample  additional confirmatory testing may be necessary for epidemiological  and / or clinical management purposes  to differentiate between  SARS-CoV-2 and other Sarbecovirus currently known to infect humans.  If clinically indicated additional testing with an alternate test  methodology [phone]) is advised. The SARS-CoV-2 RNA is generally  detectable in upper and lower respiratory sp ecimens during the acute  phase of infection. The expected result is Negative. Fact Sheet for Patients:  StrictlyIdeas.no Fact Sheet for Healthcare Providers: BankingDealers.co.za This test is not yet approved or cleared by the [place] FDA and has been authorized for detection and/or diagnosis of SARS-CoV-2 by FDA under an Emergency Use Authorization (EUA).  This EUA will remain in effect (meaning this test can be used) for the duration of the COVID-19 declaration under Section 564(b)(1) of the Act, 21 U.S.C. section 360bbb-3(b)(1), unless the authorization is terminated or revoked sooner. Performed at [facility], [place] [address] [place], [place] [address]   Surgical PCR screen     Status: None   Collection Time: 01/31/19  4:50 AM   Specimen: Nasal Mucosa; Nasal Swab  Result Value Ref Range Status   MRSA, PCR NEGATIVE NEGATIVE Final   Staphylococcus aureus NEGATIVE NEGATIVE  Final    Comment: (NOTE) The Xpert SA Assay (FDA approved for NASAL specimens in patients 5 years of age and older), is one component of a comprehensive surveillance program. It is not intended to diagnose infection nor to guide or monitor treatment. Performed at [facility], [place] [address], [place], [place] [address]      Studies: No results found. Scheduled Meds: . amLODipine  5 mg Oral Daily  . docusate sodium  100 mg Oral BID  . enoxaparin (LOVENOX) injection  40 mg Subcutaneous Q24H  . folic acid  1 mg Oral Daily  . LORazepam  0-4 mg Intravenous Q12H  . LORazepam  0-4 mg Intravenous Q12H  . multivitamin with minerals  1 tablet Oral Daily  . nicotine  21 mg Transdermal Q24H  . pantoprazole  40 mg Oral BID  . senna  1 tablet Oral BID  . sucralfate  1 g Oral TID WC & HS  . thiamine  100 mg Oral Daily   Or  . thiamine  100 mg Intravenous Daily   Continuous Infusions: . sodium chloride 50 mL/hr at 02/03/19 0600  . methocarbamol (ROBAXIN) IV 500 mg (01/30/19 2332)    Active Problems:   Alcohol abuse   Chest pain   Leukocytosis   Tobacco abuse   Closed fracture of right tibia and fibula   Fall due to stumbling   Seizure disorder ([place])   Tibia/fibula fracture  Time spent:   Irwin Brakeman, MD [facility] 02/03/2019, 3:09 PM    LOS: 4 days  How to contact the [facility] Attending or Consulting provider [place] or covering provider during after hours [place], for this patient?  1. Check the care team in [facility] and look for a) attending/consulting TRH provider listed and b) the [facility] team listed 2. Log into [email or web address] and use [place]'s universal password to access. If you do not have the password, please contact the hospital operator. 3. Locate the [facility] provider you are looking for under [facility] and page to a number that you can be directly reached. 4. If you still have difficulty reaching the provider, please page the [facility] (Director on  Call) for the Hospitalists listed on amion for assistance.

## 2019-02-03 NOTE — TOC Progression Note (Signed)
Transition of Care [facility]) - Progression Note    Patient Details  Name: MINH ROANHORSE MRN: [id number] Date of Birth: 10/27/1960  [facility]) CM/SW Contact  Robbyn Hodkinson, Juliann Pulse, RN Phone Number: 02/03/2019, 2:58 PM  Clinical Narrative: Per financial counselor-monitoring if has 39month impairement.      Expected Discharge Plan: [place] Barriers to Discharge: Continued Medical Work up  Expected Discharge Plan and Services Expected Discharge Plan: [place]   Discharge Planning Services: CM Consult   Living arrangements for the past 2 months: Apartment                                       Social Determinants of Health (SDOH) Interventions    Readmission Risk Interventions No flowsheet data found.

## 2019-02-03 NOTE — [place] FL2 (Signed)
[place] MEDICAID FL2 LEVEL OF CARE SCREENING TOOL     IDENTIFICATION  Patient Name: Jerry PickingJoseph E Lasser Birthdate: [birth year] Sex: male Admission Date (Current Location): 01/30/2019  [facility] [place]   Facility and Address:  [facility],  [address], [place] [id number]      Provider Number: [id number]  Attending Physician Name and Address:  Cleora FleetJohnson, Clanford L, MD  Relative Name and Phone Number:       Current Level of Care: Hospital Recommended Level of Care: Skilled Nursing Facility Prior Approval Number:    Date Approved/Denied:   PASRR Number: [id number]  Discharge Plan: SNF    Current Diagnoses: Patient Active Problem List   Diagnosis Date Noted  . Tibia/fibula fracture 02/01/2019  . Alcohol abuse 01/30/2019  . Chest pain 01/30/2019  . Leukocytosis 01/30/2019  . Tobacco abuse 01/30/2019  . Closed fracture of right tibia and fibula 01/30/2019  . Fall due to stumbling 01/30/2019  . Seizure disorder (HCC) 01/30/2019    Orientation RESPIRATION BLADDER Height & Weight     Self, Time, Situation, Place  Normal Continent Weight: 82.6 kg Height:  5\' 8"  (172.7 cm)  BEHAVIORAL SYMPTOMS/MOOD NEUROLOGICAL BOWEL NUTRITION STATUS      Continent Diet(Regular)  AMBULATORY STATUS COMMUNICATION OF NEEDS Skin   Limited Assist Verbally Normal                       Personal Care Assistance Level of Assistance  Bathing, Feeding, Dressing Bathing Assistance: Limited assistance   Dressing Assistance: Limited assistance     Functional Limitations Info             SPECIAL CARE FACTORS FREQUENCY  PT (By licensed PT), OT (By licensed OT)(R tib/fib fracture,NWB on R leg)     PT Frequency: 5x week OT Frequency: 5x week            Contractures Contractures Info: Not present    Additional Factors Info  Code Status, Allergies Code Status Info: Full code Allergies Info: Codeine           Current Medications  (02/03/2019):  This is the current hospital active medication list Current Facility-Administered Medications  Medication Dose Route Frequency Provider Last Rate Last Dose  . 0.9 %  sodium chloride infusion   Intravenous Continuous Beverely LowNorris, Steve, MD 50 mL/hr at 02/03/19 0600    . acetaminophen (TYLENOL) tablet 325-650 mg  325-650 mg Oral Q6H PRN Beverely LowNorris, Steve, MD      . amLODipine (NORVASC) tablet 5 mg  5 mg Oral Daily Laural BenesJohnson, Clanford L, MD   5 mg at 02/03/19 0939  . bisacodyl (DULCOLAX) suppository 10 mg  10 mg Rectal Daily PRN Beverely LowNorris, Steve, MD      . docusate sodium (COLACE) capsule 100 mg  100 mg Oral BID Beverely LowNorris, Steve, MD   100 mg at 02/03/19 0916  . enoxaparin (LOVENOX) injection 40 mg  40 mg Subcutaneous Q24H Beverely LowNorris, Steve, MD   40 mg at 02/03/19 0916  . folic acid (FOLVITE) tablet 1 mg  1 mg Oral Daily Beverely LowNorris, Steve, MD   1 mg at 02/03/19 0916  . hydrALAZINE (APRESOLINE) injection 10 mg  10 mg Intravenous Q4H PRN Laural BenesJohnson, Clanford L, MD   10 mg at 02/03/19 0634  . HYDROmorphone (DILAUDID) injection 0.5-1 mg  0.5-1 mg Intravenous Q4H PRN Beverely LowNorris, Steve, MD   1 mg at 02/03/19 0340  . hydrOXYzine (ATARAX/VISTARIL) tablet 25 mg  25 mg  Oral TID PRN Netta Cedars, MD   25 mg at 01/31/19 2147  . LORazepam (ATIVAN) injection 0-4 mg  0-4 mg Intravenous Joni Fears, MD   2 mg at 02/02/19 2111  . LORazepam (ATIVAN) injection 0-4 mg  0-4 mg Intravenous Q12H Johnson, Clanford L, MD   2 mg at 02/02/19 1650  . methocarbamol (ROBAXIN) tablet 500 mg  500 mg Oral Q6H PRN Netta Cedars, MD   500 mg at 02/03/19 5366   Or  . methocarbamol (ROBAXIN) 500 mg in dextrose 5 % 50 mL IVPB  500 mg Intravenous Q6H PRN Netta Cedars, MD 100 mL/hr at 01/30/19 2332 500 mg at 01/30/19 2332  . metoCLOPramide (REGLAN) tablet 5-10 mg  5-10 mg Oral Q8H PRN Netta Cedars, MD       Or  . metoCLOPramide (REGLAN) injection 5-10 mg  5-10 mg Intravenous Q8H PRN Netta Cedars, MD      . multivitamin with minerals tablet 1  tablet  1 tablet Oral Daily Netta Cedars, MD   1 tablet at 02/03/19 0916  . nicotine (NICODERM CQ - dosed in mg/24 hours) patch 21 mg  21 mg Transdermal Q24H Netta Cedars, MD   21 mg at 02/02/19 2110  . ondansetron (ZOFRAN) tablet 4 mg  4 mg Oral Q6H PRN Netta Cedars, MD       Or  . ondansetron [facility]) injection 4 mg  4 mg Intravenous Q6H PRN Netta Cedars, MD      . oxyCODONE (Oxy IR/ROXICODONE) immediate release tablet 10-15 mg  10-15 mg Oral Q4H PRN Netta Cedars, MD   15 mg at 02/02/19 1221  . pantoprazole (PROTONIX) EC tablet 40 mg  40 mg Oral BID Netta Cedars, MD   40 mg at 02/03/19 0916  . polyethylene glycol (MIRALAX / GLYCOLAX) packet 17 g  17 g Oral Daily PRN Netta Cedars, MD      . senna [facility]) tablet 8.6 mg  1 tablet Oral BID Netta Cedars, MD   8.6 mg at 02/03/19 0916  . sucralfate (CARAFATE) 1 GM/10ML suspension 1 g  1 g Oral TID WC & HS Netta Cedars, MD   1 g at 02/03/19 0916  . thiamine (VITAMIN B-1) tablet 100 mg  100 mg Oral Daily Netta Cedars, MD   100 mg at 02/03/19 4403   Or  . thiamine (B-1) injection 100 mg  100 mg Intravenous Daily Netta Cedars, MD   100 mg at 01/30/19 2202     Discharge Medications: Please see discharge summary for a list of discharge medications.  Relevant Imaging Results:  Relevant Lab Results:   Additional Information [place]  Maudy Yonan, Juliann Pulse, RN

## 2019-02-03 NOTE — TOC Initial Note (Signed)
Transition of Care [facility]) - Initial/Assessment Note    Patient Details  Name: Jerry Humphrey MRN: [id number] Date of Birth: 1960/10/07  Transition of Care [facility]) CM/SW Contact:    Dessa Phi, RN Phone Number: 02/03/2019, 12:28 PM  Clinical Narrative: Patient agreed to SNF-faxed out to facilities await bed offers. Left vm w/financial counselor for update on medicaid status,paged PT/OT for daily sessions to progress to possibly d/c home or to shelter. TC to tel#'s patient provided & those listed as contacts-either no response or not correct.Patient a+ox3. Noted PT/OT recc SNF. Will request LOG for SNF. Patient states unable to return home will need shelter if able to progress to d/c home.Continue to assess & follow for d/c.                   Expected Discharge Plan: Skilled Nursing Facility Barriers to Discharge: Continued Medical Work up   Patient Goals and CMS Choice        Expected Discharge Plan and Services Expected Discharge Plan: [place]   Discharge Planning Services: CM Consult   Living arrangements for the past 2 months: Apartment                                      Prior Living Arrangements/Services Living arrangements for the past 2 months: Apartment Lives with:: Self Patient language and need for interpreter reviewed:: Yes Do you feel safe going back to the place where you live?: No      Need for Family Participation in Patient Care: No (Comment) Care giver support system in place?: No (comment)   Criminal Activity/Legal Involvement Pertinent to Current Situation/Hospitalization: No - Comment as needed  Activities of Daily Living Home Assistive Devices/Equipment: None ADL Screening (condition at time of admission) Patient's cognitive ability adequate to safely complete daily activities?: Yes Is the patient deaf or have difficulty hearing?: No Does the patient have difficulty seeing, even when wearing glasses/contacts?: No Does  the patient have difficulty concentrating, remembering, or making decisions?: No Patient able to express need for assistance with ADLs?: Yes Does the patient have difficulty dressing or bathing?: No Independently performs ADLs?: Yes (appropriate for developmental age) Does the patient have difficulty walking or climbing stairs?: Yes Weakness of Legs: Both Weakness of Arms/Hands: None  Permission Sought/Granted Permission sought to share information with : Case Manager Permission granted to share information with : Yes, Verbal Permission Granted     Permission granted to share info w AGENCY: facilities, & HHC agencies        Emotional Assessment Appearance:: Appears stated age Attitude/Demeanor/Rapport: Gracious Affect (typically observed): Accepting Orientation: : Oriented to Self, Oriented to Place, Oriented to  Time, Oriented to Situation Alcohol / Substance Use: Tobacco Use, Alcohol Use Psych Involvement: No (comment)  Admission diagnosis:  Fall [W19.XXXA] Closed fracture of distal end of right tibia, unspecified fracture morphology, initial encounter [S82.301A] Alcohol withdrawal delirium ([place]) [F10.231] Patient Active Problem List   Diagnosis Date Noted  . Tibia/fibula fracture 02/01/2019  . Alcohol abuse 01/30/2019  . Chest pain 01/30/2019  . Leukocytosis 01/30/2019  . Tobacco abuse 01/30/2019  . Closed fracture of right tibia and fibula 01/30/2019  . Fall due to stumbling 01/30/2019  . Seizure disorder ([place]) 01/30/2019   PCP:  Patient, [name] Pharmacy:   [place], [place] - [address] [facility] [place] [address] Phone: [phone] Fax: [phone]  [id number] - [place], [place] - [address] [facility] [address] [place] [address] Phone: [phone] Fax: [phone]     Social Determinants of Health (SDOH) Interventions     Readmission Risk Interventions No flowsheet data found.

## 2019-02-04 ENCOUNTER — Inpatient Hospital Stay (HOSPITAL_COMMUNITY): Payer: Self-pay

## 2019-02-04 DIAGNOSIS — S82301A Unspecified fracture of lower end of right tibia, initial encounter for closed fracture: Secondary | ICD-10-CM

## 2019-02-04 LAB — BASIC METABOLIC PANEL WITH GFR
Anion gap: 10 (ref 5–15)
BUN: 11 mg/dL (ref 6–20)
CO2: 26 mmol/L (ref 22–32)
Calcium: 8.8 mg/dL — ABNORMAL LOW (ref 8.9–10.3)
Chloride: 97 mmol/L — ABNORMAL LOW (ref 98–111)
Creatinine, Ser: 0.93 mg/dL (ref 0.61–1.24)
GFR calc Af Amer: >60 mL/min
GFR calc non Af Amer: >60 mL/min
Glucose, Bld: 124 mg/dL — ABNORMAL HIGH (ref 70–99)
Potassium: 3.9 mmol/L (ref 3.5–5.1)
Sodium: 133 mmol/L — ABNORMAL LOW (ref 135–145)

## 2019-02-04 LAB — CBC
HCT: 35.3 % — ABNORMAL LOW (ref 39.0–52.0)
Hemoglobin: 11.8 g/dL — ABNORMAL LOW (ref 13.0–17.0)
MCH: 31.6 pg (ref 26.0–34.0)
MCHC: 33.4 g/dL (ref 30.0–36.0)
MCV: 94.6 fL (ref 80.0–100.0)
Platelets: 390 K/uL (ref 150–400)
RBC: 3.73 MIL/uL — ABNORMAL LOW (ref 4.22–5.81)
RDW: 11.7 % (ref 11.5–15.5)
WBC: 9.3 K/uL (ref 4.0–10.5)
nRBC: 0 % (ref 0.0–0.2)

## 2019-02-04 LAB — URINALYSIS, ROUTINE W REFLEX MICROSCOPIC
Bilirubin Urine: NEGATIVE
Glucose, UA: NEGATIVE mg/dL
Hgb urine dipstick: NEGATIVE
Ketones, ur: NEGATIVE mg/dL
Leukocytes,Ua: NEGATIVE
Nitrite: NEGATIVE
Protein, ur: NEGATIVE mg/dL
Specific Gravity, Urine: 1.011 (ref 1.005–1.030)
pH: 7 (ref 5.0–8.0)

## 2019-02-04 LAB — VITAMIN D 25 HYDROXY (VIT D DEFICIENCY, FRACTURES): Vit D, 25-Hydroxy: 30.3 ng/mL (ref 30.0–100.0)

## 2019-02-04 LAB — HIV ANTIBODY (ROUTINE TESTING W REFLEX): HIV Screen 4th Generation wRfx: NONREACTIVE

## 2019-02-04 LAB — GLUCOSE, CAPILLARY: Glucose-Capillary: 116 mg/dL — ABNORMAL HIGH (ref 70–99)

## 2019-02-04 IMAGING — DX PORTABLE CHEST - 1 VIEW
1 series · 2 of 2 positions shown · non-contrast
Comparison: [DATE]

CLINICAL DATA: Sudden onset of fever.  Recent surgery.

EXAM:
PORTABLE CHEST 1 VIEW

[Series 1: chest ap · 0.14mm/px · 2 of 2 slices shown]
[im 1/2]
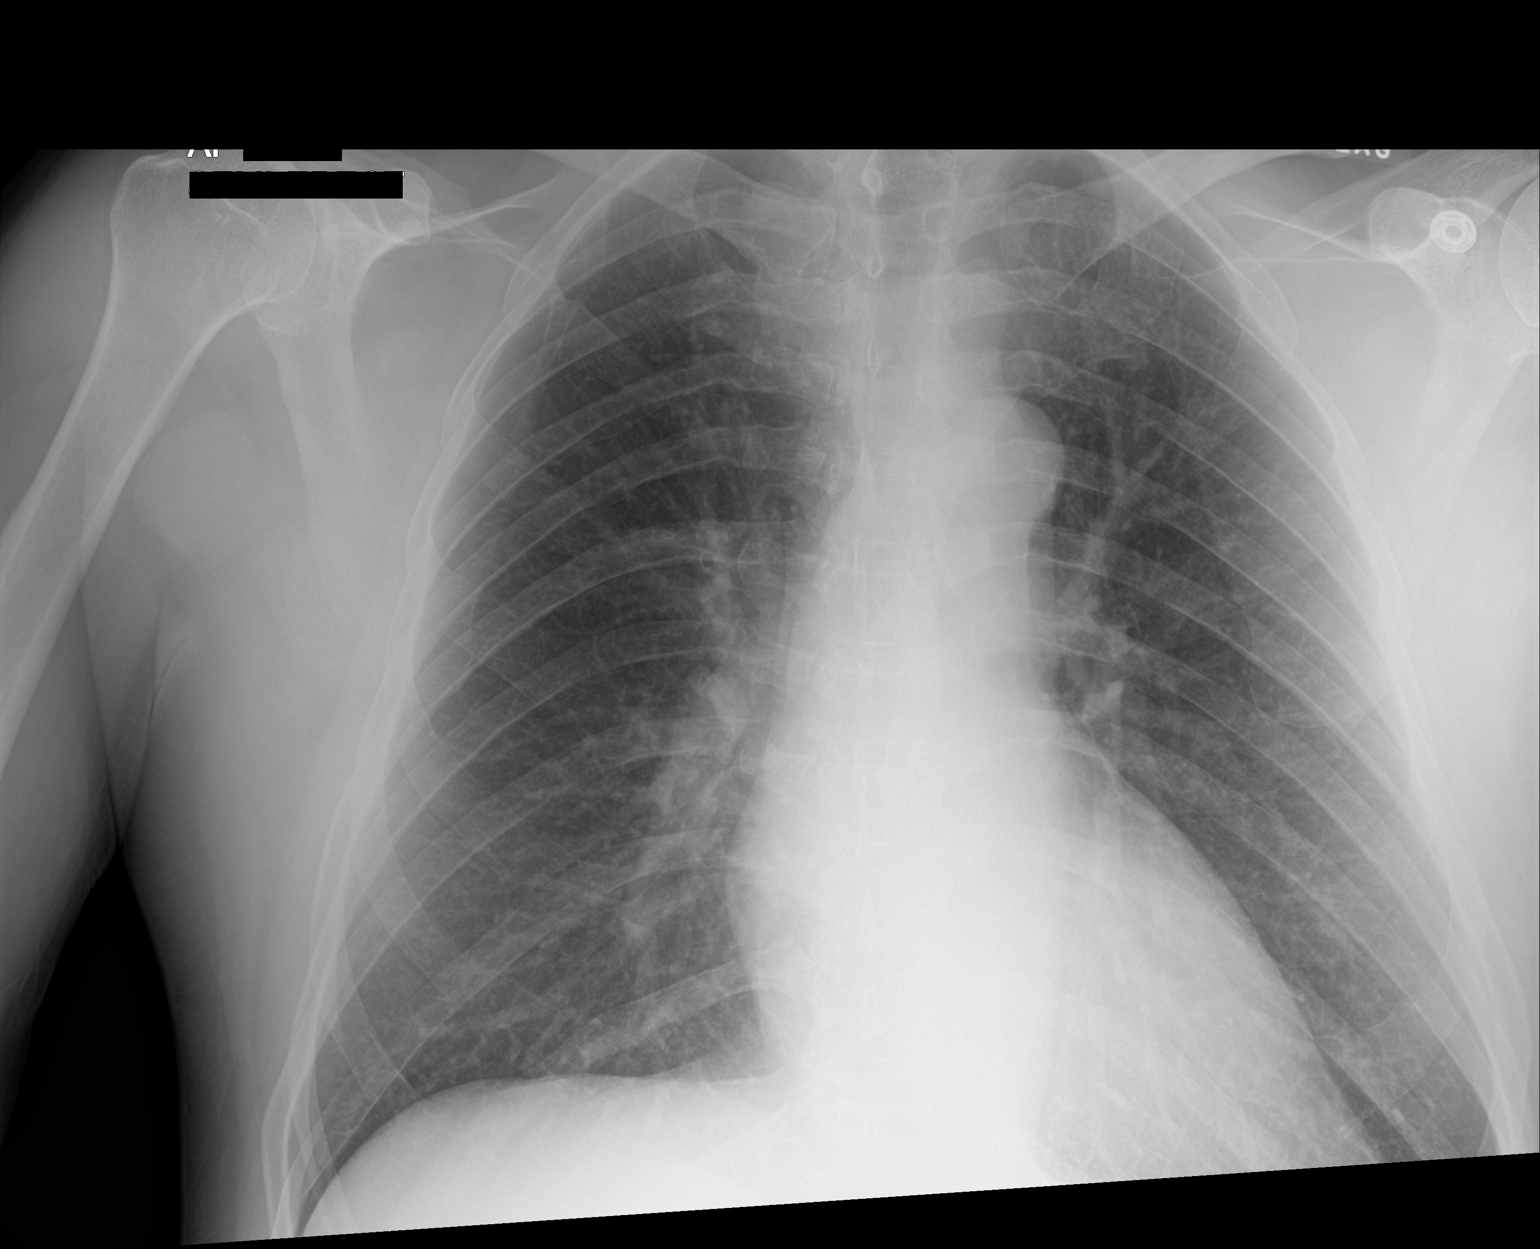
[im 2/2]
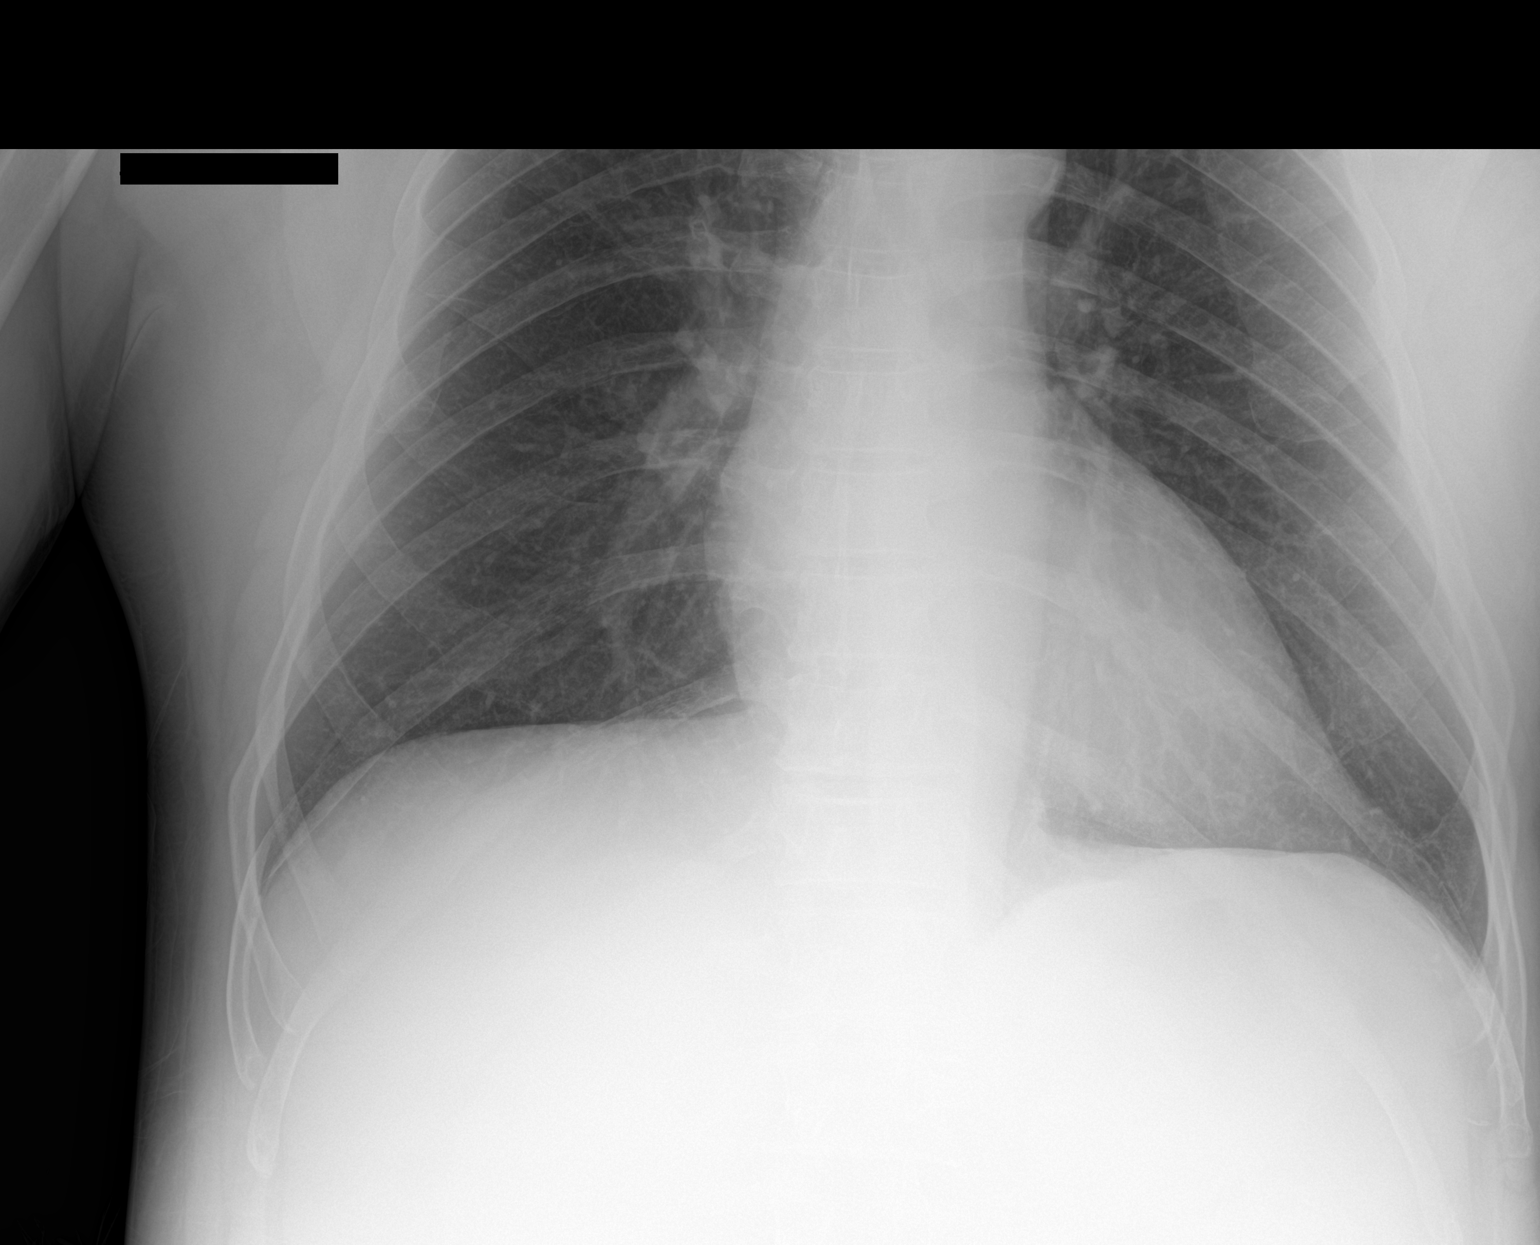

[2 of 2 positions shown; findings below may reference images not displayed]

FINDINGS: Cardiac silhouette is normal in size. Normal mediastinal and hilar
contours.

Clear lungs.  No pleural effusion or pneumothorax.

Skeletal structures are unremarkable.
IMPRESSION: No active disease.

## 2019-02-04 MED ORDER — POLYETHYLENE GLYCOL 3350 17 G PO PACK
17.0000 g | PACK | Freq: Two times a day (BID) | ORAL | Status: DC
  Administered 2019-02-04 – 2019-02-16 (×19): 17 g via ORAL
  Filled 2019-02-04 (×21): qty 1

## 2019-02-04 MED ORDER — BUTALBITAL-APAP-CAFFEINE 50-325-40 MG PO TABS
2.0000 | ORAL_TABLET | Freq: Once | ORAL | Status: AC | PRN
  Administered 2019-02-04: 2 via ORAL
  Filled 2019-02-04: qty 2

## 2019-02-04 NOTE — Progress Notes (Signed)
PROGRESS NOTE    Jerry Humphrey E Humphrey  MVH:[id number] DOB: [birth year] DOA: 01/30/2019 PCP: Patient, [name]   Brief Narrative:  58 year old male with hypertension, tobacco, alcohol, and question of remote seizures presented after a fall with an acute right tib-fib fracture.  Assessment & Plan:   Active Problems:   Alcohol abuse   Chest pain   Leukocytosis   Tobacco abuse   Closed fracture of right tibia and fibula   Fall due to stumbling   Seizure disorder (HCC)   Tibia/fibula fracture  1. Acute right tib-fib fracture- this injury required operative management.  Orthopedics has been consulted.  The patient went to the OR 02/01/2019 and is POD#1.  Post op mgmt per ortho team.  Pt awaiting PT evaluation.  Pt hopeful for SNF placement.   2. History of alcoholism- patient denies history of withdrawal per previous provider, we are monitoring him on CIWA protocol.  3. Atypical chest pain-RESOLVED.  Was likely secondary to GERD-high-sensitivity troponins have  been negligible x3.  He had a normal 2D echocardiogram for completeness.  EKG with no acute or worrisome findings.  His A1c is normal.  His lipids are okay.  4. Reactive leukocytosis-WBC slightly bumped post surgery.  5. Tobacco/alcohol use-patient counseled to avoid tobacco and alcohol especially since it could delay wound healing.  He verbalized understanding.  Will offer nicotine patch while he is in the hospital.  6. Remote history of seizure - pt not taking antiepileptics.  I wonder if he had alcohol withdrawal seizure in the past? He is not sure.   7. Fever: x1 on 7/15.  Will check blood and urine.  Continue IS.  Pt satting well on RA.  Consider cxr based on exam.   DVT prophylaxis: lovenox Code Status: full  Family Communication: none at bedside Disposition Plan: pending placement   Consultants:   orthopedics  Procedures:  7/12  Open reduction internal fixation of right distal displaced fibular fracture using  Biomet plate with closed intramedullary nailing of displaced distal tibia fracture using Biomet VersaNail.  Echo IMPRESSIONS    1. The left ventricle has normal systolic function with an ejection fraction of 60-65%. The cavity size was normal. There is mild asymmetric left ventricular hypertrophy. Left ventricular diastolic Doppler parameters are consistent with impaired  relaxation.  2. The right ventricle has normal systolic function. The cavity was normal. There is no increase in right ventricular wall thickness.  3. Mild aortic annular calcification noted.  Antimicrobials:  Anti-infectives (From admission, onward)   Start     Dose/Rate Route Frequency Ordered Stop   02/01/19 1730  ceFAZolin (ANCEF) IVPB 2g/100 mL premix     2 g 200 mL/hr over 30 Minutes Intravenous Every 6 hours 02/01/19 1526 02/02/19 0542   02/01/19 0600  ceFAZolin (ANCEF) IVPB 2g/100 mL premix     2 g 200 mL/hr over 30 Minutes Intravenous On call to O.R. 02/01/19 0348 02/01/19 1157     Subjective: C/o pain to RLE  Objective: Vitals:   02/04/19 0054 02/04/19 0657 02/04/19 1530 02/04/19 1627  BP: (!) 147/88 (!) 155/98 (!) 170/102 (!) 152/96  Pulse: 90 91 91   Resp: 20 20    Temp: 99.4 F (37.4 C) 99.3 F (37.4 C) (!) 100.8 F (38.2 C)   TempSrc: Oral Oral Oral   SpO2: 96% 92% 97%   Weight:      Height:        Intake/Output Summary (Last 24 hours) at 02/04/2019 1629 Last  data filed at 02/04/2019 1546 Gross per 24 hour  Intake 240 ml  Output 1575 ml  Net -1335 ml   Filed Weights   02/01/19 0900  Weight: 82.6 kg    Examination:  General exam: Appears calm and comfortable  Respiratory system: Clear to auscultation. Respiratory effort normal. Cardiovascular system: S1 & S2 heard, RRR.  Gastrointestinal system: Abdomen is nondistended, soft and nontender. Central nervous system: Alert and oriented. No focal neurological deficits. Extremities: RLE in splint Skin: No rashes, lesions or ulcers  Psychiatry: Judgement and insight appear normal. Mood & affect appropriate.     Data Reviewed: I have personally reviewed following labs and imaging studies  CBC: Recent Labs  Lab 01/30/19 1526 01/31/19 0121 02/01/19 0455 02/02/19 0544 02/03/19 0609  WBC 14.5* 10.9* 5.8 10.8* 9.5  NEUTROABS 11.0*  --   --   --   --   HGB 15.1 14.0 12.5* 12.3* 12.2*  HCT 45.2 43.6 39.1 37.1* 37.0*  MCV 93.8 94.6 95.4 93.7 94.1  PLT 297 281 216 223 220   Basic Metabolic Panel: Recent Labs  Lab 01/30/19 1526 01/31/19 0121 02/01/19 1845 02/02/19 0544  NA 137 135  --  134*  K 4.8 4.8  --  3.9  CL 103 101  --  100  CO2 21* 19*  --  24  GLUCOSE 76 68*  --  177*  BUN 13 18  --  10  CREATININE 1.15 1.08 1.19 0.95  CALCIUM 9.0 9.0  --  8.5*   GFR: Estimated Creatinine Clearance: 87.8 mL/min (by C-G formula based on SCr of 0.95 mg/dL). Liver Function Tests: Recent Labs  Lab 01/30/19 1526  AST 107*  ALT 125*  ALKPHOS 68  BILITOT 0.8  PROT 8.9*  ALBUMIN 4.7   No results for input(s): LIPASE, AMYLASE in the last 168 hours. No results for input(s): AMMONIA in the last 168 hours. Coagulation Profile: No results for input(s): INR, PROTIME in the last 168 hours. Cardiac Enzymes: No results for input(s): CKTOTAL, CKMB, CKMBINDEX, TROPONINI in the last 168 hours. BNP (last 3 results) No results for input(s): PROBNP in the last 8760 hours. HbA1C: No results for input(s): HGBA1C in the last 72 hours. CBG: Recent Labs  Lab 01/31/19 0601 01/31/19 0634 02/04/19 1619  GLUCAP 69* 141* 116*   Lipid Profile: No results for input(s): CHOL, HDL, LDLCALC, TRIG, CHOLHDL, LDLDIRECT in the last 72 hours. Thyroid Function Tests: No results for input(s): TSH, T4TOTAL, FREET4, T3FREE, THYROIDAB in the last 72 hours. Anemia Panel: No results for input(s): VITAMINB12, FOLATE, FERRITIN, TIBC, IRON, RETICCTPCT in the last 72 hours. Sepsis Labs: No results for input(s): PROCALCITON, LATICACIDVEN in  the last 168 hours.  Recent Results (from the past 240 hour(s))  SARS Coronavirus 2 (CEPHEID - Performed in [facility] hospital lab), Hosp Order     Status: None   Collection Time: 01/30/19  4:00 PM   Specimen: Nasopharyngeal Swab  Result Value Ref Range Status   SARS Coronavirus 2 NEGATIVE NEGATIVE Final    Comment: (NOTE) If result is NEGATIVE SARS-CoV-2 target nucleic acids are NOT DETECTED. The SARS-CoV-2 RNA is generally detectable in upper and lower  respiratory specimens during the acute phase of infection. The lowest  concentration of SARS-CoV-2 viral copies this assay can detect is 250  copies / mL. A negative result does not preclude SARS-CoV-2 infection  and should not be used as the sole basis for treatment or other  patient management decisions.  A negative  result may occur with  improper specimen collection / handling, submission of specimen other  than nasopharyngeal swab, presence of viral mutation(s) within the  areas targeted by this assay, and inadequate number of viral copies  (<250 copies / mL). A negative result must be combined with clinical  observations, patient history, and epidemiological information. If result is POSITIVE SARS-CoV-2 target nucleic acids are DETECTED. The SARS-CoV-2 RNA is generally detectable in upper and lower  respiratory specimens dur ing the acute phase of infection.  Positive  results are indicative of active infection with SARS-CoV-2.  Clinical  correlation with patient history and other diagnostic information is  necessary to determine patient infection status.  Positive results do  not rule out bacterial infection or co-infection with other viruses. If result is PRESUMPTIVE POSTIVE SARS-CoV-2 nucleic acids MAY BE PRESENT.   A presumptive positive result was obtained on the submitted specimen  and confirmed on repeat testing.  While 2019 novel coronavirus  (SARS-CoV-2) nucleic acids may be present in the submitted sample   additional confirmatory testing may be necessary for epidemiological  and / or clinical management purposes  to differentiate between  SARS-CoV-2 and other Sarbecovirus currently known to infect humans.  If clinically indicated additional testing with an alternate test  methodology [phone]([id number]) is advised. The SARS-CoV-2 RNA is generally  detectable in upper and lower respiratory sp ecimens during the acute  phase of infection. The expected result is Negative. Fact Sheet for Patients:  BoilerBrush.com.cyhttps://www.fda.gov/media/136312/download Fact Sheet for Healthcare Providers: https://pope.com/https://www.fda.gov/media/136313/download This test is not yet approved or cleared by the [place] FDA and has been authorized for detection and/or diagnosis of SARS-CoV-2 by FDA under an Emergency Use Authorization (EUA).  This EUA will remain in effect (meaning this test can be used) for the duration of the COVID-19 declaration under Section 564(b)(1) of the Act, 21 U.S.C. section 360bbb-3(b)(1), unless the authorization is terminated or revoked sooner. Performed at [facility], [address] [place], [place] [address]   Surgical PCR screen     Status: None   Collection Time: 01/31/19  4:50 AM   Specimen: Nasal Mucosa; Nasal Swab  Result Value Ref Range Status   MRSA, PCR NEGATIVE NEGATIVE Final   Staphylococcus aureus NEGATIVE NEGATIVE Final    Comment: (NOTE) The Xpert SA Assay (FDA approved for NASAL specimens in patients 59 years of age and older), is one component of a comprehensive surveillance program. It is not intended to diagnose infection nor to guide or monitor treatment. Performed at [facility], [address] [place], [place] [address]          Radiology Studies: No results found.      Scheduled Meds: . amLODipine  5 mg Oral Daily  . docusate sodium  100 mg Oral BID  . enoxaparin (LOVENOX) injection  40 mg Subcutaneous Q24H  . folic acid  1 mg  Oral Daily  . multivitamin with minerals  1 tablet Oral Daily  . nicotine  21 mg Transdermal Q24H  . pantoprazole  40 mg Oral BID  . polyethylene glycol  17 g Oral BID  . senna  1 tablet Oral BID  . sucralfate  1 g Oral TID WC & HS  . thiamine  100 mg Oral Daily   Or  . thiamine  100 mg Intravenous Daily   Continuous Infusions: . sodium chloride 50 mL/hr at 02/03/19 2248  . methocarbamol (ROBAXIN) IV 500 mg (01/30/19 2332)     LOS:  5 days    Time spent: over 30 min    Fayrene Helper, MD [facility] Pager AMION  If 7PM-7AM, please contact night-coverage [email or web address] Password [facility] 02/04/2019, 4:29 PM

## 2019-02-04 NOTE — Progress Notes (Signed)
Attempted to call patients brother - [name], as patient requested.  No answer and voicemail not left as patient does not want his mother who shares the same number to have any info.  Patient made aware

## 2019-02-04 NOTE — TOC Progression Note (Signed)
Transition of Care [facility]) - Progression Note    Patient Details  Name: Jerry Humphrey MRN: [id number] Date of Birth: Mar 20, 1961  Transition of Care [facility]) CM/SW Contact  Calle Schader, Juliann Pulse, RN Phone Number: 02/04/2019, 9:17 AM  Clinical Narrative: Will await screen from SNF [facility] rep Tammy to offer LOG with PT.      Expected Discharge Plan: [place] Barriers to Discharge: Continued Medical Work up  Expected Discharge Plan and Services Expected Discharge Plan: [place]   Discharge Planning Services: CM Consult   Living arrangements for the past 2 months: Apartment                                       Social Determinants of Health (SDOH) Interventions    Readmission Risk Interventions No flowsheet data found.

## 2019-02-04 NOTE — Progress Notes (Signed)
OT Cancellation Note  Patient Details Name: Jerry Humphrey MRN: [id number] DOB: 06-22-1961   Cancelled Treatment:    Reason Eval/Treat Not Completed: Pain limiting ability to participate.  RN in with pt.  He is c/o bad headache. Will try to check back tomorrow.  [place] 02/04/2019, 4:11 PM  Lesle Chris, OTR/L Acute Rehabilitation Services [phone] [facility] pager [phone] office 02/04/2019

## 2019-02-04 NOTE — Progress Notes (Addendum)
   02/03/19 1636  PT Visit Information  Last PT Received On 02/03/19  Assistance Needed +2 (amb safety)  History of Present Illness 58 y.o. male with medical history significant of ETOH use, seizure. Fall due to stumbling over a tree stump with ankle deformity-Closed fracture of right tibia and fibula s/p ORIFdistal displaced tib/fibular fracture 7/12  Precautions  Precautions Fall  Required Braces or Orthoses Splint/Cast  Splint/Cast R lower leg  Restrictions  Weight Bearing Restrictions Yes  RLE Weight Bearing NWB  Pain Assessment  Pain Assessment Faces  Faces Pain Scale 6  Pain Location R LE  Pain Descriptors / Indicators Aching;Sore;Throbbing  Pain Intervention(s) Limited activity within patient's tolerance;Monitored during session  Cognition  Arousal/Alertness Awake/alert  Behavior During Therapy WFL for tasks assessed/performed  Overall Cognitive Status Within Functional Limits for tasks assessed  Bed Mobility  Overal bed mobility Needs Assistance  Bed Mobility Supine to Sit;Sit to Supine  Supine to sit Min assist;HOB elevated  Sit to supine Min assist;HOB elevated  General bed mobility comments Assist for R LE. Increased time.  Transfers  Overall transfer level Needs assistance  Equipment used None  Transfers Squat Pivot Transfers;Stand Pivot Transfers  Stand pivot transfers Min assist  Squat pivot transfers Min guard  General transfer comment Min guard squat pivot x1, Min assist stand pivot x1. Bed<>Wheelchair.Wheelchair set up required.  Immunologist propulsion Both upper extremities  Distance 200  Wheelchair Assistance Details (indicate cue type and reason) cues for safety, technique. close guard.  PT - End of Session  Activity Tolerance Patient tolerated treatment well  Patient left in bed;with call bell/phone within reach;with bed alarm set   PT - Assessment/Plan  PT Plan Current plan remains appropriate  PT Visit Diagnosis Unsteadiness on  feet (R26.81);Muscle weakness (generalized) (M62.81);History of falling (Z91.81);Pain  Pain - Right/Left Right  Pain - part of body Leg  PT Frequency (ACUTE ONLY) Min 3X/week  Follow Up Recommendations SNF  PT equipment Wheelchair (measurements PT);3in1 (PT);Rolling walker with 5" wheels  AM-PAC PT "6 Clicks" Mobility Outcome Measure (Version 2)  Help needed turning from your back to your side while in a flat bed without using bedrails? 3  Help needed moving from lying on your back to sitting on the side of a flat bed without using bedrails? 3  Help needed moving to and from a bed to a chair (including a wheelchair)? 3  Help needed standing up from a chair using your arms (e.g., wheelchair or bedside chair)? 2  Help needed to walk in hospital room? 2  Help needed climbing 3-5 steps with a railing?  1  6 Click Score 14  Consider Recommendation of Discharge To: CIR/SNF/LTACH    Weston Anna, PT Acute Rehabilitation Services Pager: [phone] Office: [phone]

## 2019-02-04 NOTE — Progress Notes (Signed)
   Subjective: 3 Days Post-Op Procedure(s) (LRB): OPEN REDUCTION INTERNAL FIXATION (ORIF) TIBIA/FIBULA FRACTURE (Right)  Recheck right leg s/p tib/fib ORIF Pt still c/o moderate pain while up Therapy went well yesterday Denies any new symptoms  Patient reports pain as moderate.  Objective:   VITALS:   Vitals:   02/04/19 0054 02/04/19 0657  BP: (!) 147/88 (!) 155/98  Pulse: 90 91  Resp: 20 20  Temp: 99.4 F (37.4 C) 99.3 F (37.4 C)  SpO2: 96% 92%    Right lower extremity currently in splint No signs of drainage nv intact distally Good rom of right knee  LABS Recent Labs    02/02/19 0544 02/03/19 0609  HGB 12.3* 12.2*  HCT 37.1* 37.0*  WBC 10.8* 9.5  PLT 223 220    Recent Labs    02/01/19 1845 02/02/19 0544  NA  --  134*  K  --  3.9  BUN  --  10  CREATININE 1.19 0.95  GLUCOSE  --  177*     Assessment/Plan: 3 Days Post-Op Procedure(s) (LRB): OPEN REDUCTION INTERNAL FIXATION (ORIF) TIBIA/FIBULA FRACTURE (Right) Continue PT/OT Non weight bearing right lower extremity D/c planning will be difficult due to homelessness Will continue to monitor his progress    Merla Riches PA-C, [facility] now [employer] [address] [place], [place], [place] [address] Phone: [phone] [email or web address] Facebook  [employer]

## 2019-02-04 NOTE — TOC Progression Note (Signed)
Transition of Care [facility]) - Progression Note    Patient Details  Name: Jerry Humphrey MRN: [id number] Date of Birth: [birth year]  [facility] of [facility]) CM/SW Contact  Aahan Marques, Juliann Pulse, RN Phone Number: 02/04/2019, 10:41 AM  Clinical Narrative: Faxed to [phone], & [facility] @ [place]-await response.      Expected Discharge Plan: [place] Barriers to Discharge: Continued Medical Work up  Expected Discharge Plan and Services Expected Discharge Plan: [place]   Discharge Planning Services: CM Consult   Living arrangements for the past 2 months: Apartment                                       Social Determinants of Health (SDOH) Interventions    Readmission Risk Interventions No flowsheet data found.

## 2019-02-05 LAB — COMPREHENSIVE METABOLIC PANEL
ALT: 26 U/L (ref 0–44)
AST: 23 U/L (ref 15–41)
Albumin: 3.4 g/dL — ABNORMAL LOW (ref 3.5–5.0)
Alkaline Phosphatase: 54 U/L (ref 38–126)
Anion gap: 10 (ref 5–15)
BUN: 12 mg/dL (ref 6–20)
CO2: 26 mmol/L (ref 22–32)
Calcium: 8.8 mg/dL — ABNORMAL LOW (ref 8.9–10.3)
Chloride: 96 mmol/L — ABNORMAL LOW (ref 98–111)
Creatinine, Ser: 0.88 mg/dL (ref 0.61–1.24)
GFR calc Af Amer: >60 mL/min (ref >60)
GFR calc non Af Amer: >60 mL/min (ref >60)
Glucose, Bld: 105 mg/dL — ABNORMAL HIGH (ref 70–99)
Potassium: 3.6 mmol/L (ref 3.5–5.1)
Sodium: 132 mmol/L — ABNORMAL LOW (ref 135–145)
Total Bilirubin: 0.9 mg/dL (ref 0.3–1.2)
Total Protein: 7.3 g/dL (ref 6.5–8.1)

## 2019-02-05 LAB — CBC
HCT: 38.9 % — ABNORMAL LOW (ref 39.0–52.0)
Hemoglobin: 13 g/dL (ref 13.0–17.0)
MCH: 31.1 pg (ref 26.0–34.0)
MCHC: 33.4 g/dL (ref 30.0–36.0)
MCV: 93.1 fL (ref 80.0–100.0)
Platelets: 262 10*3/uL (ref 150–400)
RBC: 4.18 MIL/uL — ABNORMAL LOW (ref 4.22–5.81)
RDW: 11.5 % (ref 11.5–15.5)
WBC: 10.1 10*3/uL (ref 4.0–10.5)
nRBC: 0 % (ref 0.0–0.2)

## 2019-02-05 LAB — MAGNESIUM: Magnesium: 1.9 mg/dL (ref 1.7–2.4)

## 2019-02-05 NOTE — Progress Notes (Signed)
There are no bed offers at present time from a SNF.

## 2019-02-05 NOTE — Progress Notes (Signed)
PROGRESS NOTE    OM LIZOTTE  WFU:[id number] DOB: 02-24-1961 DOA: 01/30/2019 PCP: Patient, [name]   Brief Narrative:  58 year old male with hypertension, tobacco, alcohol, and question of remote seizures presented after Dagoberto Nealy fall with an acute right tib-fib fracture.  Assessment & Plan:   Active Problems:   Alcohol abuse   Chest pain   Leukocytosis   Tobacco abuse   Closed fracture of right tibia and fibula   Fall due to stumbling   Seizure disorder ([place])   Tibia/fibula fracture   Closed fracture of right distal tibia  1. Acute right tib-fib fracture- this injury required operative management.  Orthopedics has been consulted.  The patient went to the OR 02/01/2019 and is POD#1.  Post op mgmt per ortho team.  Pt awaiting PT evaluation.  Pt hopeful for SNF placement.   2. History of alcoholism- patient denies history of withdrawal per previous provider, we are monitoring him on CIWA protocol.  3. Atypical chest pain-RESOLVED.  Was likely secondary to GERD-high-sensitivity troponins have  been negligible x3.  He had Zykira Matlack normal 2D echocardiogram for completeness.  EKG with no acute or worrisome findings.  His A1c is normal.  His lipids are okay.  4. Reactive leukocytosis-WBC slightly bumped post surgery.  5. Tobacco/alcohol use-patient counseled to avoid tobacco and alcohol especially since it could delay wound healing.  He verbalized understanding.  Will offer nicotine patch while he is in the hospital.  6. Remote history of seizure - pt not taking antiepileptics.  I wonder if he had alcohol withdrawal seizure in the past? He is not sure.   7. Fever: x1 on 7/15.  Will check blood cultures (pending) and urine (UA negative).  Continue IS.  Pt satting well on RA.  CXR negative.  Continue to monitor.   DVT prophylaxis: lovenox Code Status: full  Family Communication: none at bedside Disposition Plan: pending placement   Consultants:   orthopedics  Procedures:  7/12  Open  reduction internal fixation of right distal displaced fibular fracture using Biomet plate with closed intramedullary nailing of displaced distal tibia fracture using Biomet VersaNail.  Echo IMPRESSIONS    1. The left ventricle has normal systolic function with an ejection fraction of 60-65%. The cavity size was normal. There is mild asymmetric left ventricular hypertrophy. Left ventricular diastolic Doppler parameters are consistent with impaired  relaxation.  2. The right ventricle has normal systolic function. The cavity was normal. There is no increase in right ventricular wall thickness.  3. Mild aortic annular calcification noted.  Antimicrobials:  Anti-infectives (From admission, onward)   Start     Dose/Rate Route Frequency Ordered Stop   02/01/19 1730  ceFAZolin (ANCEF) IVPB 2g/100 mL premix     2 g 200 mL/hr over 30 Minutes Intravenous Every 6 hours 02/01/19 1526 02/02/19 0542   02/01/19 0600  ceFAZolin (ANCEF) IVPB 2g/100 mL premix     2 g 200 mL/hr over 30 Minutes Intravenous On call to O.R. 02/01/19 0348 02/01/19 1157     Subjective: C/o pain to RLE and splint issues.  Discussed with orthopedics and asked them to reevaluate today.  Objective: Vitals:   02/04/19 1820 02/04/19 2356 02/05/19 0239 02/05/19 0536  BP:  (!) 169/87 (!) 175/113 140/84  Pulse:  92 89 96  Resp:  18  20  Temp: 99.1 F (37.3 C) 99.1 F (37.3 C)  99.1 F (37.3 C)  TempSrc: Oral Oral  Oral  SpO2:  100%  98%  Weight:      Height:        Intake/Output Summary (Last 24 hours) at 02/05/2019 1628 Last data filed at 02/05/2019 1500 Gross per 24 hour  Intake 3618.29 ml  Output 1850 ml  Net 1768.29 ml   Filed Weights   02/01/19 0900  Weight: 82.6 kg    Examination:  General: No acute distress. Cardiovascular: Heart sounds show Laketra Bowdish regular rate, and rhythm. Lungs: Clear to auscultation bilaterally Abdomen: Soft, nontender, nondistended Neurological: Alert and oriented 3. Moves all  extremities 4. Cranial nerves II through XII grossly intact. Skin: Warm and dry. No rashes or lesions. Extremities: RLE in splint   Data Reviewed: I have personally reviewed following labs and imaging studies  CBC: Recent Labs  Lab 01/30/19 1526  02/01/19 0455 02/02/19 0544 02/03/19 0609 02/04/19 1636 02/05/19 0442  WBC 14.5*   < > 5.8 10.8* 9.5 9.3 10.1  NEUTROABS 11.0*  --   --   --   --   --   --   HGB 15.1   < > 12.5* 12.3* 12.2* 11.8* 13.0  HCT 45.2   < > 39.1 37.1* 37.0* 35.3* 38.9*  MCV 93.8   < > 95.4 93.7 94.1 94.6 93.1  PLT 297   < > 216 223 220 390 262   < > = values in this interval not displayed.   Basic Metabolic Panel: Recent Labs  Lab 01/30/19 1526 01/31/19 0121 02/01/19 1845 02/02/19 0544 02/04/19 1636 02/05/19 0442  NA 137 135  --  134* 133* 132*  K 4.8 4.8  --  3.9 3.9 3.6  CL 103 101  --  100 97* 96*  CO2 21* 19*  --  24 26 26   GLUCOSE 76 68*  --  177* 124* 105*  BUN 13 18  --  10 11 12   CREATININE 1.15 1.08 1.19 0.95 0.93 0.88  CALCIUM 9.0 9.0  --  8.5* 8.8* 8.8*  MG  --   --   --   --   --  1.9   GFR: Estimated Creatinine Clearance: 94.7 mL/min (by C-G formula based on SCr of 0.88 mg/dL). Liver Function Tests: Recent Labs  Lab 01/30/19 1526 02/05/19 0442  AST 107* 23  ALT 125* 26  ALKPHOS 68 54  BILITOT 0.8 0.9  PROT 8.9* 7.3  ALBUMIN 4.7 3.4*   No results for input(s): LIPASE, AMYLASE in the last 168 hours. No results for input(s): AMMONIA in the last 168 hours. Coagulation Profile: No results for input(s): INR, PROTIME in the last 168 hours. Cardiac Enzymes: No results for input(s): CKTOTAL, CKMB, CKMBINDEX, TROPONINI in the last 168 hours. BNP (last 3 results) No results for input(s): PROBNP in the last 8760 hours. HbA1C: No results for input(s): HGBA1C in the last 72 hours. CBG: Recent Labs  Lab 01/31/19 0601 01/31/19 0634 02/04/19 1619  GLUCAP 69* 141* 116*   Lipid Profile: No results for input(s): CHOL, HDL,  LDLCALC, TRIG, CHOLHDL, LDLDIRECT in the last 72 hours. Thyroid Function Tests: No results for input(s): TSH, T4TOTAL, FREET4, T3FREE, THYROIDAB in the last 72 hours. Anemia Panel: No results for input(s): VITAMINB12, FOLATE, FERRITIN, TIBC, IRON, RETICCTPCT in the last 72 hours. Sepsis Labs: No results for input(s): PROCALCITON, LATICACIDVEN in the last 168 hours.  Recent Results (from the past 240 hour(s))  SARS Coronavirus 2 (CEPHEID - Performed in [facility] hospital lab), Hosp Order     Status: None   Collection Time: 01/30/19  4:00 PM  Specimen: Nasopharyngeal Swab  Result Value Ref Range Status   SARS Coronavirus 2 NEGATIVE NEGATIVE Final    Comment: (NOTE) If result is NEGATIVE SARS-CoV-2 target nucleic acids are NOT DETECTED. The SARS-CoV-2 RNA is generally detectable in upper and lower  respiratory specimens during the acute phase of infection. The lowest  concentration of SARS-CoV-2 viral copies this assay can detect is 250  copies / mL. Gracy Ehly negative result does not preclude SARS-CoV-2 infection  and should not be used as the sole basis for treatment or other  patient management decisions.  Kathleena Freeman negative result may occur with  improper specimen collection / handling, submission of specimen other  than nasopharyngeal swab, presence of viral mutation(s) within the  areas targeted by this assay, and inadequate number of viral copies  (<250 copies / mL). Chandni Gagan negative result must be combined with clinical  observations, patient history, and epidemiological information. If result is POSITIVE SARS-CoV-2 target nucleic acids are DETECTED. The SARS-CoV-2 RNA is generally detectable in upper and lower  respiratory specimens dur ing the acute phase of infection.  Positive  results are indicative of active infection with SARS-CoV-2.  Clinical  correlation with patient history and other diagnostic information is  necessary to determine patient infection status.  Positive results do  not  rule out bacterial infection or co-infection with other viruses. If result is PRESUMPTIVE POSTIVE SARS-CoV-2 nucleic acids MAY BE PRESENT.   Danelly Hassinger presumptive positive result was obtained on the submitted specimen  and confirmed on repeat testing.  While 2019 novel coronavirus  (SARS-CoV-2) nucleic acids may be present in the submitted sample  additional confirmatory testing may be necessary for epidemiological  and / or clinical management purposes  to differentiate between  SARS-CoV-2 and other Sarbecovirus currently known to infect humans.  If clinically indicated additional testing with an alternate test  methodology [phone]([id number]) is advised. The SARS-CoV-2 RNA is generally  detectable in upper and lower respiratory sp ecimens during the acute  phase of infection. The expected result is Negative. Fact Sheet for Patients:  BoilerBrush.com.cyhttps://www.fda.gov/media/136312/download Fact Sheet for Healthcare Providers: https://pope.com/https://www.fda.gov/media/136313/download This test is not yet approved or cleared by the [place] FDA and has been authorized for detection and/or diagnosis of SARS-CoV-2 by FDA under an Emergency Use Authorization (EUA).  This EUA will remain in effect (meaning this test can be used) for the duration of the COVID-19 declaration under Section 564(b)(1) of the Act, 21 U.S.C. section 360bbb-3(b)(1), unless the authorization is terminated or revoked sooner. Performed at [facility], [address] [place], [place] [address]   Surgical PCR screen     Status: None   Collection Time: 01/31/19  4:50 AM   Specimen: Nasal Mucosa; Nasal Swab  Result Value Ref Range Status   MRSA, PCR NEGATIVE NEGATIVE Final   Staphylococcus aureus NEGATIVE NEGATIVE Final    Comment: (NOTE) The Xpert SA Assay (FDA approved for NASAL specimens in patients 59 years of age and older), is one component of Jeancarlo Leffler comprehensive surveillance program. It is not intended to diagnose infection nor to  guide or monitor treatment. Performed at [facility], [address] [place], [place] [address]   Culture, blood (routine x 2)     Status: None (Preliminary result)   Collection Time: 02/04/19  4:36 PM   Specimen: BLOOD  Result Value Ref Range Status   Specimen Description   Final    BLOOD LEFT ANTECUBITAL Performed at [facility], [address]  [place], [place] [address]    Special Requests   Final    BOTTLES DRAWN AEROBIC ONLY Blood Culture adequate volume Performed at [facility], [address] [place], [place] [address]    Culture   Final    NO GROWTH < 24 HOURS Performed at [facility], [address] [place], [place] [address]    Report Status PENDING  Incomplete  Culture, blood (routine x 2)     Status: None (Preliminary result)   Collection Time: 02/04/19  4:37 PM   Specimen: BLOOD  Result Value Ref Range Status   Specimen Description   Final    BLOOD LEFT ANTECUBITAL Performed at [facility], [address] [place], [place] [address]    Special Requests   Final    BOTTLES DRAWN AEROBIC ONLY Blood Culture adequate volume Performed at [facility], [address] [place], [place] [address]    Culture   Final    NO GROWTH < 24 HOURS Performed at [facility], [address] [place], [place] [address]    Report Status PENDING  Incomplete         Radiology Studies: Dg Chest Port 1 View  Result Date: 02/04/2019 CLINICAL DATA:  Sudden onset of fever.  Recent surgery. EXAM: PORTABLE CHEST 1 VIEW COMPARISON:  01/30/2019 FINDINGS: Cardiac silhouette is normal in size. Normal mediastinal and hilar contours. Clear lungs.  No pleural effusion or pneumothorax. Skeletal structures are unremarkable. IMPRESSION: No active disease. Electronically Signed   By: Amie Portlandavid  Ormond M.D.   On: 02/04/2019 20:18        Scheduled Meds: . amLODipine  5 mg Oral  Daily  . docusate sodium  100 mg Oral BID  . enoxaparin (LOVENOX) injection  40 mg Subcutaneous Q24H  . folic acid  1 mg Oral Daily  . multivitamin with minerals  1 tablet Oral Daily  . nicotine  21 mg Transdermal Q24H  . pantoprazole  40 mg Oral BID  . polyethylene glycol  17 g Oral BID  . senna  1 tablet Oral BID  . sucralfate  1 g Oral TID WC & HS  . thiamine  100 mg Oral Daily   Or  . thiamine  100 mg Intravenous Daily   Continuous Infusions: . methocarbamol (ROBAXIN) IV 500 mg (01/30/19 2332)     LOS: 6 days    Time spent: over 30 min    Lacretia Nicksaldwell Powell, MD [facility] Pager AMION  If 7PM-7AM, please contact night-coverage [email or web address] Password [email or web address] 02/05/2019, 4:28 PM

## 2019-02-05 NOTE — Progress Notes (Signed)
Physical Therapy Treatment Patient Details Name: Jerry Humphrey MRN: [id number] DOB: [birth year] Today's Date: 02/05/2019    History of Present Illness 58 y.o. male with medical history significant of ETOH use, seizure. Fall due to stumbling over a tree stump with ankle deformity-Closed fracture of right tibia and fibula s/p ORIFdistal displaced tib/fibular fracture 7/12    PT Comments    POD # 4 Pt OOB in recliner with R LE elevated with ICE.  Pt recently worked with OT.  Applied L shoe and educated on footware to increase leg length and increase safety.  Assisted with transfer training and gait.  General transfer comment: 75% VC's on proper hand placement and safety with turn completion.  50% VC's on NWB.  General Gait Details: 50% VCs safety, technique, sequence, adherence to NWB. Assist to stabilize and maneuver safely with RW. Followed closely with recliner. Returned to recliner with R LE elevated and ICE packs.    Follow Up Recommendations  SNF     Equipment Recommendations  Wheelchair (measurements PT);3in1 (PT);Rolling walker with 5" wheels    Recommendations for Other Services       Precautions / Restrictions Precautions Precautions: Fall Required Braces or Orthoses: Splint/Cast Splint/Cast: R lower leg Restrictions Weight Bearing Restrictions: Yes RLE Weight Bearing: Non weight bearing    Mobility  Bed Mobility         Supine to sit: Min guard     General bed mobility comments: OOB in recliner  Transfers Overall transfer level: Needs assistance Equipment used: Rolling walker (2 wheeled) Transfers: Squat Pivot Transfers;Stand Pivot Transfers Sit to Stand: Min assist;Mod assist Stand pivot transfers: Mod assist       General transfer comment: 75% VC's on proper hand placement and safety with turn completion.  50% VC's on NWB  Ambulation/Gait Ambulation/Gait assistance: Min assist Gait Distance (Feet): 11 Feet Assistive device: Rolling walker (2  wheeled) Gait Pattern/deviations: Step-to pattern Gait velocity: decreased   General Gait Details: 50% VCs safety, technique, sequence, adherence to NWB. Assist to stabilize and maneuver safely with RW. Followed closely with recliner.   Stairs             Wheelchair Mobility    Modified Rankin (Stroke Patients Only)       Balance                                            Cognition Arousal/Alertness: Awake/alert Behavior During Therapy: WFL for tasks assessed/performed Overall Cognitive Status: Within Functional Limits for tasks assessed                                        Exercises      General Comments        Pertinent Vitals/Pain Pain Assessment: Faces Faces Pain Scale: Hurts little more Pain Location: R LE Pain Descriptors / Indicators: Throbbing Pain Intervention(s): Monitored during session;Premedicated before session;Repositioned;Ice applied    Home Living                      Prior Function            PT Goals (current goals can now be found in the care plan section) Progress towards PT goals: Progressing toward goals    Frequency  Min 3X/week      PT Plan Current plan remains appropriate    Co-evaluation              AM-PAC PT "6 Clicks" Mobility   Outcome Measure  Help needed turning from your back to your side while in a flat bed without using bedrails?: A Little Help needed moving from lying on your back to sitting on the side of a flat bed without using bedrails?: A Little Help needed moving to and from a bed to a chair (including a wheelchair)?: A Little Help needed standing up from a chair using your arms (e.g., wheelchair or bedside chair)?: A Lot Help needed to walk in hospital room?: A Lot Help needed climbing 3-5 steps with a railing? : Total 6 Click Score: 14    End of Session Equipment Utilized During Treatment: Gait belt Activity Tolerance: Patient tolerated  treatment well Patient left: in chair;with call bell/phone within reach;with chair alarm set   PT Visit Diagnosis: Unsteadiness on feet (R26.81);Muscle weakness (generalized) (M62.81);History of falling (Z91.81);Pain Pain - Right/Left: Right Pain - part of body: Leg     Time: [id number] PT Time Calculation (min) (ACUTE ONLY): 16 min  Charges:  $Gait Training: 8-22 mins                     Felecia ShellingLori Arneta Mahmood  PTA Acute  Rehabilitation Services Pager      [phone] Office      [phone]

## 2019-02-05 NOTE — Progress Notes (Signed)
Occupational Therapy Treatment Patient Details Name: Jerry PickingJoseph E Larrick MRN: [id number] DOB: 09-09-1960 Today's Date: 02/05/2019    History of present illness 58 y.o. male with medical history significant of ETOH use, seizure. Fall due to stumbling over a tree stump with ankle deformity-Closed fracture of right tibia and fibula s/p ORIFdistal displaced tib/fibular fracture 7/12   OT comments  Pt cooperative and motivated.  Only able to stand for one grooming activity due to throbbing pain.  Able to perform ADL with set up, bed level.  Will work to progress towards more normal to sit to stand as long as this is safe for him. He was able to maintain NWB.    Follow Up Recommendations  SNF;Supervision/Assistance - 24 hour    Equipment Recommendations  3 in 1 bedside commode    Recommendations for Other Services      Precautions / Restrictions Precautions Precautions: Fall Required Braces or Orthoses: Splint/Cast Splint/Cast: R lower leg Restrictions RLE Weight Bearing: Non weight bearing       Mobility Bed Mobility         Supine to sit: supervision        Transfers   Equipment used: Rolling walker (2 wheeled)     Stand pivot transfers: Min guard       General transfer comment: for safety bed to chair    Balance                                           ADL either performed or assessed with clinical judgement   ADL       Grooming: Wash/dry hands;Standing;Min guard                                 General ADL Comments: min guard for safety, washing hands at sink and maintaining NWB.  Pt had throbbing pain. Returned to chair and performed oral care from that position. Pt is able to perform adls from bed level with set up, rolling.  Donned sock in long sitting     Vision       Perception     Praxis      Cognition Arousal/Alertness: Awake/alert Behavior During Therapy: WFL for tasks assessed/performed Overall Cognitive  Status: Within Functional Limits for tasks assessed                                          Exercises     Shoulder Instructions       General Comments      Pertinent Vitals/ Pain       Pain Assessment: Faces Faces Pain Scale: Hurts whole lot Pain Location: R LE Pain Descriptors / Indicators: Throbbing Pain Intervention(s): Limited activity within patient's tolerance;Monitored during session  Home Living                                          Prior Functioning/Environment              Frequency  Min 2X/week        Progress Toward Goals  OT Goals(current goals can now be found in the  care plan section)  Progress towards OT goals: Progressing toward goals     Plan      Co-evaluation                 AM-PAC OT "6 Clicks" Daily Activity     Outcome Measure   Help from another person eating meals?: None Help from another person taking care of personal grooming?: A Little Help from another person toileting, which includes using toliet, bedpan, or urinal?: A Little Help from another person bathing (including washing, rinsing, drying)?: A Little Help from another person to put on and taking off regular upper body clothing?: A Little Help from another person to put on and taking off regular lower body clothing?: A Little 6 Click Score: 19    End of Session Equipment Utilized During Treatment: Gait belt;Rolling walker  OT Visit Diagnosis: Unsteadiness on feet (R26.81);Other abnormalities of gait and mobility (R26.89);Muscle weakness (generalized) (M62.81);Pain Pain - Right/Left: Right Pain - part of body: Leg   Activity Tolerance Patient limited by pain   Patient Left in chair;with call bell/phone within reach;with chair alarm set   Nurse Communication          Time: 9169-4503 OT Time Calculation (min): 30 min  Charges: OT General Charges $OT Visit: 1 Visit OT Treatments $Self Care/Home Management : 23-37  mins  Lesle Chris, OTR/L Acute Rehabilitation Services [phone] [facility] pager [phone] office 02/05/2019   [place] 02/05/2019, 3:26 PM

## 2019-02-05 NOTE — Progress Notes (Signed)
   Subjective: 4 Days Post-Op Procedure(s) (LRB): OPEN REDUCTION INTERNAL FIXATION (ORIF) TIBIA/FIBULA FRACTURE (Right)  Recheck right leg s/p ORIF of right tib/fib Pt c/o pinching type pain at the anterior knee and medial ankle Denies any numbness or tingling  Patient reports pain as moderate.  Objective:   VITALS:   Vitals:   02/05/19 0239 02/05/19 0536  BP: (!) 175/113 140/84  Pulse: 89 96  Resp:  20  Temp:  99.1 F (37.3 C)  SpO2:  98%    Dressing unwrapped and new dressing applied to the knee Splint left in place but I did pull the splint apart to aid with pain New dressing applied to the knee and the leg was rewrapped Healing incision with no drainage or erythema nv intact distally Good quad activation and able to lift leg without much discomfort   LABS Recent Labs    02/03/19 0609 02/04/19 1636 02/05/19 0442  HGB 12.2* 11.8* 13.0  HCT 37.0* 35.3* 38.9*  WBC 9.5 9.3 10.1  PLT 220 390 262    Recent Labs    02/04/19 1636 02/05/19 0442  NA 133* 132*  K 3.9 3.6  BUN 11 12  CREATININE 0.93 0.88  GLUCOSE 124* 105*     Assessment/Plan: 4 Days Post-Op Procedure(s) (LRB): OPEN REDUCTION INTERNAL FIXATION (ORIF) TIBIA/FIBULA FRACTURE (Right) Continue PT/OT D/c planning Currently right leg/ankle is stable in the splint Plan for f/u in 1-2 weeks in the office after discharge Will continue to monitor his progress    Merla Riches PA-C, [place] is now [employer] [address], [place], [place] [address] Phone: [phone] [email or web address] Facebook  [employer]

## 2019-02-06 LAB — CBC
HCT: 38.2 % — ABNORMAL LOW (ref 39.0–52.0)
Hemoglobin: 12.7 g/dL — ABNORMAL LOW (ref 13.0–17.0)
MCH: 30.9 pg (ref 26.0–34.0)
MCHC: 33.2 g/dL (ref 30.0–36.0)
MCV: 92.9 fL (ref 80.0–100.0)
Platelets: 289 10*3/uL (ref 150–400)
RBC: 4.11 MIL/uL — ABNORMAL LOW (ref 4.22–5.81)
RDW: 11.6 % (ref 11.5–15.5)
WBC: 9.3 10*3/uL (ref 4.0–10.5)
nRBC: 0 % (ref 0.0–0.2)

## 2019-02-06 LAB — COMPREHENSIVE METABOLIC PANEL
ALT: 26 U/L (ref 0–44)
AST: 22 U/L (ref 15–41)
Albumin: 3.4 g/dL — ABNORMAL LOW (ref 3.5–5.0)
Alkaline Phosphatase: 50 U/L (ref 38–126)
Anion gap: 10 (ref 5–15)
BUN: 17 mg/dL (ref 6–20)
CO2: 28 mmol/L (ref 22–32)
Calcium: 9.1 mg/dL (ref 8.9–10.3)
Chloride: 95 mmol/L — ABNORMAL LOW (ref 98–111)
Creatinine, Ser: 1.01 mg/dL (ref 0.61–1.24)
GFR calc Af Amer: >60 mL/min (ref >60)
GFR calc non Af Amer: >60 mL/min (ref >60)
Glucose, Bld: 124 mg/dL — ABNORMAL HIGH (ref 70–99)
Potassium: 4.8 mmol/L (ref 3.5–5.1)
Sodium: 133 mmol/L — ABNORMAL LOW (ref 135–145)
Total Bilirubin: 1 mg/dL (ref 0.3–1.2)
Total Protein: 7.4 g/dL (ref 6.5–8.1)

## 2019-02-06 LAB — MAGNESIUM: Magnesium: 2.2 mg/dL (ref 1.7–2.4)

## 2019-02-06 NOTE — TOC Progression Note (Signed)
Transition of Care [facility]) - Progression Note    Patient Details  Name: Jerry Humphrey MRN: [id number] Date of Birth: Jul 31, 1960  [facility] CM/SW Contact  Chadric Kimberley, Juliann Pulse, RN Phone Number: 02/06/2019, 12:51 PM  Clinical Narrative: [place] [facility] [place] [facility] still assessing if able to accept-we are offering LOG/15 PT visits/no meds included(per supv)-since they are @ a reasonable cost. Marylin [facility] will inform her director that meds will not be included-they are still assessing if able to accept.       Expected Discharge Plan: [place] Barriers to Discharge: Continued Medical Work up  Expected Discharge Plan and Services Expected Discharge Plan: [place]   Discharge Planning Services: CM Consult   Living arrangements for the past 2 months: Apartment                                       Social Determinants of Health (SDOH) Interventions    Readmission Risk Interventions No flowsheet data found.

## 2019-02-06 NOTE — Progress Notes (Signed)
OT Cancellation Note  Patient Details Name: Jerry Humphrey MRN: [id number] DOB: Apr 18, 1961   Cancelled Treatment:    Reason Eval/Treat Not Completed: Other (comment). Pt fatiqued; states he didn't sleep well.  [place] 02/06/2019, 2:50 PM  Lesle Chris, OTR/L Acute Rehabilitation Services [phone] [facility] pager [phone] office 02/06/2019

## 2019-02-06 NOTE — Progress Notes (Signed)
PROGRESS NOTE    Jerry Humphrey  ZOX:[id number] DOB: [birth year] DOA: 01/30/2019 PCP: Patient, [name]   Brief Narrative:  58 year old male with hypertension, tobacco, alcohol, and question of remote seizures presented after a fall with an acute right tib-fib fracture.  Assessment & Plan:   Active Problems:   Alcohol abuse   Chest pain   Leukocytosis   Tobacco abuse   Closed fracture of right tibia and fibula   Fall due to stumbling   Seizure disorder (HCC)   Tibia/fibula fracture   Closed fracture of right distal tibia  1. Acute right tib-fib fracture- this injury required operative management.  Orthopedics has been consulted.  The patient went to the OR 02/01/2019 and is POD#1.  Post op mgmt per ortho team.  Pt awaiting PT evaluation.  Pt hopeful for SNF placement.  Leg feels better after splint/dressing adjusted by ortho.  2. History of alcoholism- patient denies history of withdrawal per previous provider, we are monitoring him on CIWA protocol.  3. Atypical chest pain-RESOLVED.  Was likely secondary to GERD-high-sensitivity troponins have  been negligible x3.  He had a normal 2D echocardiogram for completeness.  EKG with no acute or worrisome findings.  His A1c is normal.  His lipids are okay.  4. Reactive leukocytosis-WBC slightly bumped post surgery.  5. Tobacco/alcohol use-patient counseled to avoid tobacco and alcohol especially since it could delay wound healing.  He verbalized understanding.  Will offer nicotine patch while he is in the hospital.  6. Remote history of seizure - pt not taking antiepileptics.  I wonder if he had alcohol withdrawal seizure in the past? He is not sure.   7. Fever: x1 on 7/15.  Will check blood cultures (ngtd x 2) and urine (UA negative).  Continue IS.  Pt satting well on RA.  CXR negative.  Continue to monitor.   DVT prophylaxis: lovenox Code Status: full  Family Communication: none at bedside Disposition Plan: pending placement   Consultants:   orthopedics  Procedures:  7/12  Open reduction internal fixation of right distal displaced fibular fracture using Biomet plate with closed intramedullary nailing of displaced distal tibia fracture using Biomet VersaNail.  Echo IMPRESSIONS    1. The left ventricle has normal systolic function with an ejection fraction of 60-65%. The cavity size was normal. There is mild asymmetric left ventricular hypertrophy. Left ventricular diastolic Doppler parameters are consistent with impaired  relaxation.  2. The right ventricle has normal systolic function. The cavity was normal. There is no increase in right ventricular wall thickness.  3. Mild aortic annular calcification noted.  Antimicrobials:  Anti-infectives (From admission, onward)   Start     Dose/Rate Route Frequency Ordered Stop   02/01/19 1730  ceFAZolin (ANCEF) IVPB 2g/100 mL premix     2 g 200 mL/hr over 30 Minutes Intravenous Every 6 hours 02/01/19 1526 02/02/19 0542   02/01/19 0600  ceFAZolin (ANCEF) IVPB 2g/100 mL premix     2 g 200 mL/hr over 30 Minutes Intravenous On call to O.R. 02/01/19 0348 02/01/19 1157     Subjective: Notes leg feels better.  Ortho adjusted dressing/splint.  Objective: Vitals:   02/05/19 2140 02/06/19 0627 02/06/19 0906 02/06/19 1323  BP: (!) 144/92 122/75 (!) 123/93 116/72  Pulse: 88 75  82  Resp: 18 16  20   Temp: 99 F (37.2 C) 98.7 F (37.1 C)  98.8 F (37.1 C)  TempSrc: Oral Oral  Oral  SpO2: 94% 98%  95%  Weight:      Height:        Intake/Output Summary (Last 24 hours) at 02/06/2019 1553 Last data filed at 02/06/2019 0636 Gross per 24 hour  Intake -  Output 1700 ml  Net -1700 ml   Filed Weights   02/01/19 0900  Weight: 82.6 kg    Examination:  General: No acute distress. Cardiovascular: Heart sounds show a regular rate, and rhythm.  Lungs: Clear to auscultation bilaterally  Abdomen: Soft, nontender, nondistended  Neurological: Alert and oriented 3.  Moves all extremities 4. Cranial nerves II through XII grossly intact. Skin: Warm and dry. No rashes or lesions. Extremities: RLE with splint in place    Data Reviewed: I have personally reviewed following labs and imaging studies  CBC: Recent Labs  Lab 02/02/19 0544 02/03/19 0609 02/04/19 1636 02/05/19 0442 02/06/19 0531  WBC 10.8* 9.5 9.3 10.1 9.3  HGB 12.3* 12.2* 11.8* 13.0 12.7*  HCT 37.1* 37.0* 35.3* 38.9* 38.2*  MCV 93.7 94.1 94.6 93.1 92.9  PLT 223 220 390 262 289   Basic Metabolic Panel: Recent Labs  Lab 01/31/19 0121 02/01/19 1845 02/02/19 0544 02/04/19 1636 02/05/19 0442 02/06/19 0531  NA 135  --  134* 133* 132* 133*  K 4.8  --  3.9 3.9 3.6 4.8  CL 101  --  100 97* 96* 95*  CO2 19*  --  24 26 26 28   GLUCOSE 68*  --  177* 124* 105* 124*  BUN 18  --  10 11 12 17   CREATININE 1.08 1.19 0.95 0.93 0.88 1.01  CALCIUM 9.0  --  8.5* 8.8* 8.8* 9.1  MG  --   --   --   --  1.9 2.2   GFR: Estimated Creatinine Clearance: 82.5 mL/min (by C-G formula based on SCr of 1.01 mg/dL). Liver Function Tests: Recent Labs  Lab 02/05/19 0442 02/06/19 0531  AST 23 22  ALT 26 26  ALKPHOS 54 50  BILITOT 0.9 1.0  PROT 7.3 7.4  ALBUMIN 3.4* 3.4*   No results for input(s): LIPASE, AMYLASE in the last 168 hours. No results for input(s): AMMONIA in the last 168 hours. Coagulation Profile: No results for input(s): INR, PROTIME in the last 168 hours. Cardiac Enzymes: No results for input(s): CKTOTAL, CKMB, CKMBINDEX, TROPONINI in the last 168 hours. BNP (last 3 results) No results for input(s): PROBNP in the last 8760 hours. HbA1C: No results for input(s): HGBA1C in the last 72 hours. CBG: Recent Labs  Lab 01/31/19 0601 01/31/19 0634 02/04/19 1619  GLUCAP 69* 141* 116*   Lipid Profile: No results for input(s): CHOL, HDL, LDLCALC, TRIG, CHOLHDL, LDLDIRECT in the last 72 hours. Thyroid Function Tests: No results for input(s): TSH, T4TOTAL, FREET4, T3FREE, THYROIDAB in  the last 72 hours. Anemia Panel: No results for input(s): VITAMINB12, FOLATE, FERRITIN, TIBC, IRON, RETICCTPCT in the last 72 hours. Sepsis Labs: No results for input(s): PROCALCITON, LATICACIDVEN in the last 168 hours.  Recent Results (from the past 240 hour(s))  SARS Coronavirus 2 (CEPHEID - Performed in [facility] hospital lab), Hosp Order     Status: None   Collection Time: 01/30/19  4:00 PM   Specimen: Nasopharyngeal Swab  Result Value Ref Range Status   SARS Coronavirus 2 NEGATIVE NEGATIVE Final    Comment: (NOTE) If result is NEGATIVE SARS-CoV-2 target nucleic acids are NOT DETECTED. The SARS-CoV-2 RNA is generally detectable in upper and lower  respiratory specimens during the acute phase of infection. The lowest  concentration of SARS-CoV-2 viral copies this assay can detect is 250  copies / mL. A negative result does not preclude SARS-CoV-2 infection  and should not be used as the sole basis for treatment or other  patient management decisions.  A negative result may occur with  improper specimen collection / handling, submission of specimen other  than nasopharyngeal swab, presence of viral mutation(s) within the  areas targeted by this assay, and inadequate number of viral copies  (<250 copies / mL). A negative result must be combined with clinical  observations, patient history, and epidemiological information. If result is POSITIVE SARS-CoV-2 target nucleic acids are DETECTED. The SARS-CoV-2 RNA is generally detectable in upper and lower  respiratory specimens dur ing the acute phase of infection.  Positive  results are indicative of active infection with SARS-CoV-2.  Clinical  correlation with patient history and other diagnostic information is  necessary to determine patient infection status.  Positive results do  not rule out bacterial infection or co-infection with other viruses. If result is PRESUMPTIVE POSTIVE SARS-CoV-2 nucleic acids MAY BE PRESENT.   A  presumptive positive result was obtained on the submitted specimen  and confirmed on repeat testing.  While 2019 novel coronavirus  (SARS-CoV-2) nucleic acids may be present in the submitted sample  additional confirmatory testing may be necessary for epidemiological  and / or clinical management purposes  to differentiate between  SARS-CoV-2 and other Sarbecovirus currently known to infect humans.  If clinically indicated additional testing with an alternate test  methodology [id number]) is advised. The SARS-CoV-2 RNA is generally  detectable in upper and lower respiratory sp ecimens during the acute  phase of infection. The expected result is Negative. Fact Sheet for Patients:  StrictlyIdeas.no Fact Sheet for Healthcare Providers: BankingDealers.co.za This test is not yet approved or cleared by the [place] FDA and has been authorized for detection and/or diagnosis of SARS-CoV-2 by FDA under an Emergency Use Authorization (EUA).  This EUA will remain in effect (meaning this test can be used) for the duration of the COVID-19 declaration under Section 564(b)(1) of the Act, 21 U.S.C. section 360bbb-3(b)(1), unless the authorization is terminated or revoked sooner. Performed at [facility], [place] [address] [place], [place] [address]   Surgical PCR screen     Status: None   Collection Time: 01/31/19  4:50 AM   Specimen: Nasal Mucosa; Nasal Swab  Result Value Ref Range Status   MRSA, PCR NEGATIVE NEGATIVE Final   Staphylococcus aureus NEGATIVE NEGATIVE Final    Comment: (NOTE) The Xpert SA Assay (FDA approved for NASAL specimens in patients 79 years of age and older), is one component of a comprehensive surveillance program. It is not intended to diagnose infection nor to guide or monitor treatment. Performed at [facility], [place] [address], [place], [place] [address]   Culture, blood (routine x  2)     Status: None (Preliminary result)   Collection Time: 02/04/19  4:36 PM   Specimen: BLOOD  Result Value Ref Range Status   Specimen Description   Final    BLOOD LEFT ANTECUBITAL Performed at [place] [address] [place], [place] [address]    Special Requests   Final    BOTTLES DRAWN AEROBIC ONLY Blood Culture adequate volume Performed at [place] [address], [place],  [address]    Culture   Final    NO GROWTH 2 DAYS Performed at [facility],  [address] [place], [place] [address]    Report Status PENDING  Incomplete  Culture, blood (routine x 2)     Status: None (Preliminary result)   Collection Time: 02/04/19  4:37 PM   Specimen: BLOOD  Result Value Ref Range Status   Specimen Description   Final    BLOOD LEFT ANTECUBITAL Performed at [facility], [address] [place], [place] [address]    Special Requests   Final    BOTTLES DRAWN AEROBIC ONLY Blood Culture adequate volume Performed at [facility] [facility], [address] [place], [place] [address]    Culture   Final    NO GROWTH 2 DAYS Performed at [facility], [address] [place], [place] [address]    Report Status PENDING  Incomplete         Radiology Studies: Dg Chest Port 1 View  Result Date: 02/04/2019 CLINICAL DATA:  Sudden onset of fever.  Recent surgery. EXAM: PORTABLE CHEST 1 VIEW COMPARISON:  01/30/2019 FINDINGS: Cardiac silhouette is normal in size. Normal mediastinal and hilar contours. Clear lungs.  No pleural effusion or pneumothorax. Skeletal structures are unremarkable. IMPRESSION: No active disease. Electronically Signed   By: Amie Portlandavid  Ormond M.D.   On: 02/04/2019 20:18        Scheduled Meds: . amLODipine  5 mg Oral Daily  . docusate sodium  100 mg Oral BID  . enoxaparin (LOVENOX) injection  40 mg Subcutaneous Q24H  . folic acid  1 mg Oral Daily  . multivitamin with  minerals  1 tablet Oral Daily  . nicotine  21 mg Transdermal Q24H  . pantoprazole  40 mg Oral BID  . polyethylene glycol  17 g Oral BID  . senna  1 tablet Oral BID  . sucralfate  1 g Oral TID WC & HS  . thiamine  100 mg Oral Daily   Or  . thiamine  100 mg Intravenous Daily   Continuous Infusions: . methocarbamol (ROBAXIN) IV 500 mg (01/30/19 2332)     LOS: 7 days    Time spent: over 30 min    Lacretia Nicksaldwell Powell, MD [facility] Pager AMION  If 7PM-7AM, please contact night-coverage [email or web address] Password [facility] 02/06/2019, 3:53 PM

## 2019-02-07 LAB — COMPREHENSIVE METABOLIC PANEL
ALT: 29 U/L (ref 0–44)
AST: 28 U/L (ref 15–41)
Albumin: 3.4 g/dL — ABNORMAL LOW (ref 3.5–5.0)
Alkaline Phosphatase: 55 U/L (ref 38–126)
Anion gap: 10 (ref 5–15)
BUN: 22 mg/dL — ABNORMAL HIGH (ref 6–20)
CO2: 27 mmol/L (ref 22–32)
Calcium: 8.9 mg/dL (ref 8.9–10.3)
Chloride: 94 mmol/L — ABNORMAL LOW (ref 98–111)
Creatinine, Ser: 0.95 mg/dL (ref 0.61–1.24)
GFR calc Af Amer: >60 mL/min (ref >60)
GFR calc non Af Amer: >60 mL/min (ref >60)
Glucose, Bld: 99 mg/dL (ref 70–99)
Potassium: 4.2 mmol/L (ref 3.5–5.1)
Sodium: 131 mmol/L — ABNORMAL LOW (ref 135–145)
Total Bilirubin: 0.7 mg/dL (ref 0.3–1.2)
Total Protein: 7.7 g/dL (ref 6.5–8.1)

## 2019-02-07 MED ORDER — OXYCODONE HCL 5 MG PO TABS
10.0000 mg | ORAL_TABLET | ORAL | Status: DC | PRN
  Administered 2019-02-07 – 2019-02-15 (×37): 10 mg via ORAL
  Filled 2019-02-07 (×38): qty 2

## 2019-02-07 NOTE — Progress Notes (Signed)
PROGRESS NOTE    Jerry PickingJoseph E Humphrey  ZOX:[id number] DOB: 01/16/1961 DOA: 01/30/2019 PCP: Patient, [name]   Brief Narrative:  58 year old male with hypertension, tobacco, alcohol, and question of remote seizures presented after Zyniah Ferraiolo fall with an acute right tib-fib fracture.  Assessment & Plan:   Active Problems:   Alcohol abuse   Chest pain   Leukocytosis   Tobacco abuse   Closed fracture of right tibia and fibula   Fall due to stumbling   Seizure disorder (HCC)   Tibia/fibula fracture   Closed fracture of right distal tibia  1. Acute right tib-fib fracture- this injury required operative management.  Orthopedics has been consulted.  The patient went to the OR 02/01/2019 and is POD#1.  Post op mgmt per ortho team.  Pt awaiting PT evaluation.  Pt hopeful for SNF placement.  Leg feels better after splint/dressing adjusted by ortho.  2. History of alcoholism- patient denies history of withdrawal per previous provider, we are monitoring him on CIWA protocol.  3. Atypical chest pain-RESOLVED.  Was likely secondary to GERD-high-sensitivity troponins have  been negligible x3.  He had Karlena Luebke normal 2D echocardiogram for completeness.  EKG with no acute or worrisome findings.  His A1c is normal.  His lipids are okay.  4. Reactive leukocytosis-WBC slightly bumped post surgery.  5. Tobacco/alcohol use-patient counseled to avoid tobacco and alcohol especially since it could delay wound healing.  He verbalized understanding.  Will offer nicotine patch while he is in the hospital.  6. Remote history of seizure - pt not taking antiepileptics.  I wonder if he had alcohol withdrawal seizure in the past? He is not sure.   7. Fever: x1 on 7/15.  Will check blood cultures (ngtd x 2) and urine (UA negative).  Continue IS.  Pt satting well on RA.  CXR negative.  Continue to monitor.   DVT prophylaxis: lovenox Code Status: full  Family Communication: none at bedside Disposition Plan: pending placement   Consultants:   orthopedics  Procedures:  7/12  Open reduction internal fixation of right distal displaced fibular fracture using Biomet plate with closed intramedullary nailing of displaced distal tibia fracture using Biomet VersaNail.  Echo IMPRESSIONS    1. The left ventricle has normal systolic function with an ejection fraction of 60-65%. The cavity size was normal. There is mild asymmetric left ventricular hypertrophy. Left ventricular diastolic Doppler parameters are consistent with impaired  relaxation.  2. The right ventricle has normal systolic function. The cavity was normal. There is no increase in right ventricular wall thickness.  3. Mild aortic annular calcification noted.  Antimicrobials:  Anti-infectives (From admission, onward)   Start     Dose/Rate Route Frequency Ordered Stop   02/01/19 1730  ceFAZolin (ANCEF) IVPB 2g/100 mL premix     2 g 200 mL/hr over 30 Minutes Intravenous Every 6 hours 02/01/19 1526 02/02/19 0542   02/01/19 0600  ceFAZolin (ANCEF) IVPB 2g/100 mL premix     2 g 200 mL/hr over 30 Minutes Intravenous On call to O.R. 02/01/19 0348 02/01/19 1157     Subjective: Feels ok, no c/o today  Objective: Vitals:   02/06/19 1748 02/06/19 2332 02/07/19 0606 02/07/19 1345  BP: 123/81 91/63 (!) 135/97 129/86  Pulse: 79 80 75 78  Resp: 18 16 16 19   Temp: 98.8 F (37.1 C) 99 F (37.2 C) 98.2 F (36.8 C) 99 F (37.2 C)  TempSrc: Oral Oral  Oral  SpO2: 95% 94% 98% 97%  Weight:  Height:        Intake/Output Summary (Last 24 hours) at 02/07/2019 1522 Last data filed at 02/07/2019 1345 Gross per 24 hour  Intake 360 ml  Output 950 ml  Net -590 ml   Filed Weights   02/01/19 0900  Weight: 82.6 kg    Examination:  General: No acute distress. Cardiovascular: Heart sounds show Bellanie Matthew regular rate, and rhythm. Lungs: Clear to auscultation bilaterally  Abdomen: Soft, nontender, nondistended Neurological: Alert and oriented 3. Moves all  extremities 4. Cranial nerves II through XII grossly intact. Skin: Warm and dry. No rashes or lesions. Extremities: RLE with splint.       Data Reviewed: I have personally reviewed following labs and imaging studies  CBC: Recent Labs  Lab 02/02/19 0544 02/03/19 0609 02/04/19 1636 02/05/19 0442 02/06/19 0531  WBC 10.8* 9.5 9.3 10.1 9.3  HGB 12.3* 12.2* 11.8* 13.0 12.7*  HCT 37.1* 37.0* 35.3* 38.9* 38.2*  MCV 93.7 94.1 94.6 93.1 92.9  PLT 223 220 390 262 289   Basic Metabolic Panel: Recent Labs  Lab 02/02/19 0544 02/04/19 1636 02/05/19 0442 02/06/19 0531 02/07/19 0643  NA 134* 133* 132* 133* 131*  K 3.9 3.9 3.6 4.8 4.2  CL 100 97* 96* 95* 94*  CO2 24 26 26 28 27   GLUCOSE 177* 124* 105* 124* 99  BUN 10 11 12 17  22*  CREATININE 0.95 0.93 0.88 1.01 0.95  CALCIUM 8.5* 8.8* 8.8* 9.1 8.9  MG  --   --  1.9 2.2  --    GFR: Estimated Creatinine Clearance: 87.8 mL/min (by C-G formula based on SCr of 0.95 mg/dL). Liver Function Tests: Recent Labs  Lab 02/05/19 0442 02/06/19 0531 02/07/19 0643  AST 23 22 28   ALT 26 26 29   ALKPHOS 54 50 55  BILITOT 0.9 1.0 0.7  PROT 7.3 7.4 7.7  ALBUMIN 3.4* 3.4* 3.4*   No results for input(s): LIPASE, AMYLASE in the last 168 hours. No results for input(s): AMMONIA in the last 168 hours. Coagulation Profile: No results for input(s): INR, PROTIME in the last 168 hours. Cardiac Enzymes: No results for input(s): CKTOTAL, CKMB, CKMBINDEX, TROPONINI in the last 168 hours. BNP (last 3 results) No results for input(s): PROBNP in the last 8760 hours. HbA1C: No results for input(s): HGBA1C in the last 72 hours. CBG: Recent Labs  Lab 02/04/19 1619  GLUCAP 116*   Lipid Profile: No results for input(s): CHOL, HDL, LDLCALC, TRIG, CHOLHDL, LDLDIRECT in the last 72 hours. Thyroid Function Tests: No results for input(s): TSH, T4TOTAL, FREET4, T3FREE, THYROIDAB in the last 72 hours. Anemia Panel: No results for input(s): VITAMINB12,  FOLATE, FERRITIN, TIBC, IRON, RETICCTPCT in the last 72 hours. Sepsis Labs: No results for input(s): PROCALCITON, LATICACIDVEN in the last 168 hours.  Recent Results (from the past 240 hour(s))  SARS Coronavirus 2 (CEPHEID - Performed in [facility] hospital lab), Hosp Order     Status: None   Collection Time: 01/30/19  4:00 PM   Specimen: Nasopharyngeal Swab  Result Value Ref Range Status   SARS Coronavirus 2 NEGATIVE NEGATIVE Final    Comment: (NOTE) If result is NEGATIVE SARS-CoV-2 target nucleic acids are NOT DETECTED. The SARS-CoV-2 RNA is generally detectable in upper and lower  respiratory specimens during the acute phase of infection. The lowest  concentration of SARS-CoV-2 viral copies this assay can detect is 250  copies / mL. Cassiel Fernandez negative result does not preclude SARS-CoV-2 infection  and should not be used as the  sole basis for treatment or other  patient management decisions.  Ellamay Fors negative result may occur with  improper specimen collection / handling, submission of specimen other  than nasopharyngeal swab, presence of viral mutation(s) within the  areas targeted by this assay, and inadequate number of viral copies  (<250 copies / mL). Legacy Carrender negative result must be combined with clinical  observations, patient history, and epidemiological information. If result is POSITIVE SARS-CoV-2 target nucleic acids are DETECTED. The SARS-CoV-2 RNA is generally detectable in upper and lower  respiratory specimens dur ing the acute phase of infection.  Positive  results are indicative of active infection with SARS-CoV-2.  Clinical  correlation with patient history and other diagnostic information is  necessary to determine patient infection status.  Positive results do  not rule out bacterial infection or co-infection with other viruses. If result is PRESUMPTIVE POSTIVE SARS-CoV-2 nucleic acids MAY BE PRESENT.   Vaibhav Fogleman presumptive positive result was obtained on the submitted specimen  and  confirmed on repeat testing.  While 2019 novel coronavirus  (SARS-CoV-2) nucleic acids may be present in the submitted sample  additional confirmatory testing may be necessary for epidemiological  and / or clinical management purposes  to differentiate between  SARS-CoV-2 and other Sarbecovirus currently known to infect humans.  If clinically indicated additional testing with an alternate test  methodology [phone]([id number]) is advised. The SARS-CoV-2 RNA is generally  detectable in upper and lower respiratory sp ecimens during the acute  phase of infection. The expected result is Negative. Fact Sheet for Patients:  BoilerBrush.com.cyhttps://www.fda.gov/media/136312/download Fact Sheet for Healthcare Providers: https://pope.com/https://www.fda.gov/media/136313/download This test is not yet approved or cleared by the [place] FDA and has been authorized for detection and/or diagnosis of SARS-CoV-2 by FDA under an Emergency Use Authorization (EUA).  This EUA will remain in effect (meaning this test can be used) for the duration of the COVID-19 declaration under Section 564(b)(1) of the Act, 21 U.S.C. section 360bbb-3(b)(1), unless the authorization is terminated or revoked sooner. Performed at [facility], [address] [place], [place] [address]   Surgical PCR screen     Status: None   Collection Time: 01/31/19  4:50 AM   Specimen: Nasal Mucosa; Nasal Swab  Result Value Ref Range Status   MRSA, PCR NEGATIVE NEGATIVE Final   Staphylococcus aureus NEGATIVE NEGATIVE Final    Comment: (NOTE) The Xpert SA Assay (FDA approved for NASAL specimens in patients 59 years of age and older), is one component of Kelee Cunningham comprehensive surveillance program. It is not intended to diagnose infection nor to guide or monitor treatment. Performed at [facility], [address] [place], [place] [address]   Culture, blood (routine x 2)     Status: None (Preliminary result)   Collection Time: 02/04/19   4:36 PM   Specimen: BLOOD  Result Value Ref Range Status   Specimen Description   Final    BLOOD LEFT ANTECUBITAL Performed at [facility], [address] [place], [place] [address]    Special Requests   Final    BOTTLES DRAWN AEROBIC ONLY Blood Culture adequate volume Performed at [facility], [address] [place], [place] [address]    Culture   Final    NO GROWTH 3 DAYS Performed at [facility], [address] [place], [place] [address]    Report Status PENDING  Incomplete  Culture, blood (routine x 2)     Status: None (Preliminary result)  Collection Time: 02/04/19  4:37 PM   Specimen: BLOOD  Result Value Ref Range Status   Specimen Description   Final    BLOOD LEFT ANTECUBITAL Performed at [place] [address], [place], [place] [address]    Special Requests   Final    BOTTLES DRAWN AEROBIC ONLY Blood Culture adequate volume Performed at [place] [address], [place], [place] [address]    Culture   Final    NO GROWTH 3 DAYS Performed at [facility], [place] [address], [place], [place] [address]    Report Status PENDING  Incomplete         Radiology Studies: No results found.      Scheduled Meds: . amLODipine  5 mg Oral Daily  . docusate sodium  100 mg Oral BID  . enoxaparin (LOVENOX) injection  40 mg Subcutaneous Q24H  . folic acid  1 mg Oral Daily  . multivitamin with minerals  1 tablet Oral Daily  . nicotine  21 mg Transdermal Q24H  . pantoprazole  40 mg Oral BID  . polyethylene glycol  17 g Oral BID  . senna  1 tablet Oral BID  . sucralfate  1 g Oral TID WC & HS  . thiamine  100 mg Oral Daily   Or  . thiamine  100 mg Intravenous Daily   Continuous Infusions: . methocarbamol (ROBAXIN) IV 500 mg (01/30/19 2332)     LOS: 8 days    Time spent: over 39 min    Fayrene Helper, MD [facility] Pager AMION  If 7PM-7AM, please  contact night-coverage [email or web address] Password [facility] 02/07/2019, 3:22 PM

## 2019-02-07 NOTE — Progress Notes (Signed)
   Subjective: 6 Days Post-Op Procedure(s) (LRB): OPEN REDUCTION INTERNAL FIXATION (ORIF) TIBIA/FIBULA FRACTURE (Right)  Pt resting comfortably in no acute distress Once awake pt c/o mild to moderate pain in the leg Denies any new symptoms or issues Awaiting disposition currently Patient reports pain as mild.  Objective:   VITALS:   Vitals:   02/06/19 2332 02/07/19 0606  BP: 91/63 (!) 135/97  Pulse: 80 75  Resp: 16 16  Temp: 99 F (37.2 C) 98.2 F (36.8 C)  SpO2: 94% 98%    Right leg: healing knee incision with no drainage nv intact distally Splint in place and looks appropriate  LABS Recent Labs    02/04/19 1636 02/05/19 0442 02/06/19 0531  HGB 11.8* 13.0 12.7*  HCT 35.3* 38.9* 38.2*  WBC 9.3 10.1 9.3  PLT 390 262 289    Recent Labs    02/04/19 1636 02/05/19 0442 02/06/19 0531  NA 133* 132* 133*  K 3.9 3.6 4.8  BUN 11 12 17   CREATININE 0.93 0.88 1.01  GLUCOSE 124* 105* 124*     Assessment/Plan: 6 Days Post-Op Procedure(s) (LRB): OPEN REDUCTION INTERNAL FIXATION (ORIF) TIBIA/FIBULA FRACTURE (Right) S/p ORIF stable from orthopedic standpoint Will continue to monitor his progress Once discharged, f/u in the office in 1-2 weeks Pain management as needed Continue non weight bearing right lower extremity    [name] Luna [place], [place] is now [facility]  [facility] [address], [place], [place], [place] [address] Phone: [phone] [email or web address]  [employer]

## 2019-02-08 LAB — CREATININE, SERUM
Creatinine, Ser: 0.98 mg/dL (ref 0.61–1.24)
GFR calc Af Amer: >60 mL/min (ref >60)
GFR calc non Af Amer: >60 mL/min (ref >60)

## 2019-02-08 MED ORDER — ACETAMINOPHEN 500 MG PO TABS
1000.0000 mg | ORAL_TABLET | Freq: Three times a day (TID) | ORAL | Status: AC
  Administered 2019-02-08 – 2019-02-11 (×9): 1000 mg via ORAL
  Filled 2019-02-08 (×9): qty 2

## 2019-02-08 NOTE — Progress Notes (Signed)
PROGRESS NOTE    Jerry Humphrey  WJX:[id number] DOB: 12-May-1961 DOA: 01/30/2019 PCP: Patient, [name]   Brief Narrative:  58 year old male with hypertension, tobacco, alcohol, and question of remote seizures presented after Jerry Humphrey fall with an acute right tib-fib fracture.  Assessment & Plan:   Active Problems:   Alcohol abuse   Chest pain   Leukocytosis   Tobacco abuse   Closed fracture of right tibia and fibula   Fall due to stumbling   Seizure disorder (HCC)   Tibia/fibula fracture   Closed fracture of right distal tibia  1. Acute right tib-fib fracture- this injury required operative management.  Orthopedics has been consulted.  The patient went to the OR 02/01/2019 and is POD#1.  Post op mgmt per ortho team.  Pt awaiting PT evaluation.  Pt hopeful for SNF placement.  Leg feels better after splint/dressing adjusted by ortho.  C/o pain today, will schedule APAP for [name] few days.  2. History of alcoholism- patient denies history of withdrawal per previous provider, we are monitoring him on CIWA protocol.  3. Atypical chest pain-RESOLVED.  Was likely secondary to GERD-high-sensitivity troponins have  been negligible x3.  He had Kielyn Kardell normal 2D echocardiogram for completeness.  EKG with no acute or worrisome findings.  His A1c is normal.  His lipids are okay.  4. Reactive leukocytosis-WBC slightly bumped post surgery.  5. Tobacco/alcohol use-patient counseled to avoid tobacco and alcohol especially since it could delay wound healing.  He verbalized understanding.  Will offer nicotine patch while he is in the hospital.  6. Remote history of seizure - pt not taking antiepileptics.  I wonder if he had alcohol withdrawal seizure in the past? He is not sure.   7. Fever: x1 on 7/15.  Will check blood cultures (ngtd x 2) and urine (UA negative).  Continue IS.  Pt satting well on RA.  CXR negative.  Continue to monitor.   DVT prophylaxis: lovenox Code Status: full  Family Communication: none  at bedside Disposition Plan: pending placement   Consultants:   orthopedics  Procedures:  7/12  Open reduction internal fixation of right distal displaced fibular fracture using Biomet plate with closed intramedullary nailing of displaced distal tibia fracture using Biomet VersaNail.  Echo IMPRESSIONS    1. The left ventricle has normal systolic function with an ejection fraction of 60-65%. The cavity size was normal. There is mild asymmetric left ventricular hypertrophy. Left ventricular diastolic Doppler parameters are consistent with impaired  relaxation.  2. The right ventricle has normal systolic function. The cavity was normal. There is no increase in right ventricular wall thickness.  3. Mild aortic annular calcification noted.  Antimicrobials:  Anti-infectives (From admission, onward)   Start     Dose/Rate Route Frequency Ordered Stop   02/01/19 1730  ceFAZolin (ANCEF) IVPB 2g/100 mL premix     2 g 200 mL/hr over 30 Minutes Intravenous Every 6 hours 02/01/19 1526 02/02/19 0542   02/01/19 0600  ceFAZolin (ANCEF) IVPB 2g/100 mL premix     2 g 200 mL/hr over 30 Minutes Intravenous On call to O.R. 02/01/19 0348 02/01/19 1157     Subjective: C/o pain.  Asking to get his pain meds more frequently (discussed currently ordered q4, which he wasn't aware). Frustrated at having to wait for help all the time.  Objective: Vitals:   02/07/19 1345 02/08/19 0008 02/08/19 0558 02/08/19 1330  BP: 129/86 131/82 132/80 136/88  Pulse: 78 79 83 82  Resp: 19  18 20 (!) 21  Temp: 99 F (37.2 C) 98.7 F (37.1 C) 98.9 F (37.2 C) 98.9 F (37.2 C)  TempSrc: Oral Oral Oral Oral  SpO2: 97% 96% 96% 97%  Weight:      Height:        Intake/Output Summary (Last 24 hours) at 02/08/2019 1404 Last data filed at 02/08/2019 1229 Gross per 24 hour  Intake 120 ml  Output 900 ml  Net -780 ml   Filed Weights   02/01/19 0900  Weight: 82.6 kg    Examination:  General: No acute distress.  Cardiovascular: Heart sounds show [name] regular rate, and rhythm.  Lungs: Clear to auscultation bilaterally  Abdomen: Soft, nontender, nondistended  Neurological: Alert and oriented 3. Moves all extremities 4. Cranial nerves II through XII grossly intact. Skin: Warm and dry. No rashes or lesions. Extremities: RLE in splint      Data Reviewed: I have personally reviewed following labs and imaging studies  CBC: Recent Labs  Lab 02/02/19 0544 02/03/19 0609 02/04/19 1636 02/05/19 0442 02/06/19 0531  WBC 10.8* 9.5 9.3 10.1 9.3  HGB 12.3* 12.2* 11.8* 13.0 12.7*  HCT 37.1* 37.0* 35.3* 38.9* 38.2*  MCV 93.7 94.1 94.6 93.1 92.9  PLT 223 220 390 262 289   Basic Metabolic Panel: Recent Labs  Lab 02/02/19 0544 02/04/19 1636 02/05/19 0442 02/06/19 0531 02/07/19 0643 02/08/19 0555  NA 134* 133* 132* 133* 131*  --   K 3.9 3.9 3.6 4.8 4.2  --   CL 100 97* 96* 95* 94*  --   CO2 24 26 26 28 27   --   GLUCOSE 177* 124* 105* 124* 99  --   BUN 10 11 12 17  22*  --   CREATININE 0.95 0.93 0.88 1.01 0.95 0.98  CALCIUM 8.5* 8.8* 8.8* 9.1 8.9  --   MG  --   --  1.9 2.2  --   --    GFR: Estimated Creatinine Clearance: 85.1 mL/min (by C-G formula based on SCr of 0.98 mg/dL). Liver Function Tests: Recent Labs  Lab 02/05/19 0442 02/06/19 0531 02/07/19 0643  AST 23 22 28   ALT 26 26 29   ALKPHOS 54 50 55  BILITOT 0.9 1.0 0.7  PROT 7.3 7.4 7.7  ALBUMIN 3.4* 3.4* 3.4*   No results for input(s): LIPASE, AMYLASE in the last 168 hours. No results for input(s): AMMONIA in the last 168 hours. Coagulation Profile: No results for input(s): INR, PROTIME in the last 168 hours. Cardiac Enzymes: No results for input(s): CKTOTAL, CKMB, CKMBINDEX, TROPONINI in the last 168 hours. BNP (last 3 results) No results for input(s): PROBNP in the last 8760 hours. HbA1C: No results for input(s): HGBA1C in the last 72 hours. CBG: Recent Labs  Lab 02/04/19 1619  GLUCAP 116*   Lipid Profile: No  results for input(s): CHOL, HDL, LDLCALC, TRIG, CHOLHDL, LDLDIRECT in the last 72 hours. Thyroid Function Tests: No results for input(s): TSH, T4TOTAL, FREET4, T3FREE, THYROIDAB in the last 72 hours. Anemia Panel: No results for input(s): VITAMINB12, FOLATE, FERRITIN, TIBC, IRON, RETICCTPCT in the last 72 hours. Sepsis Labs: No results for input(s): PROCALCITON, LATICACIDVEN in the last 168 hours.  Recent Results (from the past 240 hour(s))  SARS Coronavirus 2 (CEPHEID - Performed in [facility] hospital lab), Hosp Order     Status: None   Collection Time: 01/30/19  4:00 PM   Specimen: Nasopharyngeal Swab  Result Value Ref Range Status   SARS Coronavirus 2 NEGATIVE NEGATIVE Final  Comment: (NOTE) If result is NEGATIVE SARS-CoV-2 target nucleic acids are NOT DETECTED. The SARS-CoV-2 RNA is generally detectable in upper and lower  respiratory specimens during the acute phase of infection. The lowest  concentration of SARS-CoV-2 viral copies this assay can detect is 250  copies / mL. Daishawn Lauf negative result does not preclude SARS-CoV-2 infection  and should not be used as the sole basis for treatment or other  patient management decisions.  Niva Murren negative result may occur with  improper specimen collection / handling, submission of specimen other  than nasopharyngeal swab, presence of viral mutation(s) within the  areas targeted by this assay, and inadequate number of viral copies  (<250 copies / mL). Britania Shreeve negative result must be combined with clinical  observations, patient history, and epidemiological information. If result is POSITIVE SARS-CoV-2 target nucleic acids are DETECTED. The SARS-CoV-2 RNA is generally detectable in upper and lower  respiratory specimens dur ing the acute phase of infection.  Positive  results are indicative of active infection with SARS-CoV-2.  Clinical  correlation with patient history and other diagnostic information is  necessary to determine patient infection  status.  Positive results do  not rule out bacterial infection or co-infection with other viruses. If result is PRESUMPTIVE POSTIVE SARS-CoV-2 nucleic acids MAY BE PRESENT.   Kevan Prouty presumptive positive result was obtained on the submitted specimen  and confirmed on repeat testing.  While 2019 novel coronavirus  (SARS-CoV-2) nucleic acids may be present in the submitted sample  additional confirmatory testing may be necessary for epidemiological  and / or clinical management purposes  to differentiate between  SARS-CoV-2 and other Sarbecovirus currently known to infect humans.  If clinically indicated additional testing with an alternate test  methodology [phone]) is advised. The SARS-CoV-2 RNA is generally  detectable in upper and lower respiratory sp ecimens during the acute  phase of infection. The expected result is Negative. Fact Sheet for Patients:  StrictlyIdeas.no Fact Sheet for Healthcare Providers: BankingDealers.co.za This test is not yet approved or cleared by the [place] FDA and has been authorized for detection and/or diagnosis of SARS-CoV-2 by FDA under an Emergency Use Authorization (EUA).  This EUA will remain in effect (meaning this test can be used) for the duration of the COVID-19 declaration under Section 564(b)(1) of the Act, 21 U.S.C. section 360bbb-3(b)(1), unless the authorization is terminated or revoked sooner. Performed at [facility], [place] [address], [place], [place] [address]   Surgical PCR screen     Status: None   Collection Time: 01/31/19  4:50 AM   Specimen: Nasal Mucosa; Nasal Swab  Result Value Ref Range Status   MRSA, PCR NEGATIVE NEGATIVE Final   Staphylococcus aureus NEGATIVE NEGATIVE Final    Comment: (NOTE) The Xpert SA Assay (FDA approved for NASAL specimens in patients 64 years of age and older), is one component of Jameca Chumley comprehensive surveillance program. It is not  intended to diagnose infection nor to guide or monitor treatment. Performed at [facility], [place] [address] [place], [place] [address]   Culture, blood (routine x 2)     Status: None (Preliminary result)   Collection Time: 02/04/19  4:36 PM   Specimen: BLOOD  Result Value Ref Range Status   Specimen Description   Final    BLOOD LEFT ANTECUBITAL Performed at [place] [address], [place], [place] [address]    Special Requests   Final    BOTTLES DRAWN AEROBIC ONLY Blood Culture  adequate volume Performed at [facility], [address] [place], [place] [address]    Culture   Final    NO GROWTH 4 DAYS Performed at [facility], [address] [place], [place] [address]    Report Status PENDING  Incomplete  Culture, blood (routine x 2)     Status: None (Preliminary result)   Collection Time: 02/04/19  4:37 PM   Specimen: BLOOD  Result Value Ref Range Status   Specimen Description   Final    BLOOD LEFT ANTECUBITAL Performed at [facility], [address] [place], [place] [address]    Special Requests   Final    BOTTLES DRAWN AEROBIC ONLY Blood Culture adequate volume Performed at [facility], [address] [place], [place] [address]    Culture   Final    NO GROWTH 4 DAYS Performed at [facility], [address] [place], [place] [address]    Report Status PENDING  Incomplete         Radiology Studies: No results found.      Scheduled Meds: . acetaminophen  1,000 mg Oral Q8H  . amLODipine  5 mg Oral Daily  . docusate sodium  100 mg Oral BID  . enoxaparin (LOVENOX) injection  40 mg Subcutaneous Q24H  . folic acid  1 mg Oral Daily  . multivitamin with minerals  1 tablet Oral Daily  . nicotine  21 mg Transdermal Q24H  . pantoprazole  40 mg Oral BID  . polyethylene glycol  17 g Oral BID  . senna  1 tablet Oral BID  . sucralfate  1 g Oral TID  WC & HS  . thiamine  100 mg Oral Daily   Or  . thiamine  100 mg Intravenous Daily   Continuous Infusions: . methocarbamol (ROBAXIN) IV 500 mg (01/30/19 2332)     LOS: 9 days    Time spent: over 30 min    Lacretia Nicksaldwell Powell, MD [facility] Pager AMION  If 7PM-7AM, please contact night-coverage [email or web address] Password [email or web address] 02/08/2019, 2:04 PM

## 2019-02-09 LAB — CULTURE, BLOOD (ROUTINE X 2)
Culture: NO GROWTH
Culture: NO GROWTH
Special Requests: ADEQUATE
Special Requests: ADEQUATE

## 2019-02-09 NOTE — Progress Notes (Signed)
PROGRESS NOTE    Jerry Humphrey  ZOX:[id number] DOB: 06-May-1961 DOA: 01/30/2019 PCP: Patient, [name]   Brief Narrative:  58 year old male with hypertension, tobacco, alcohol, and question of remote seizures presented after Jerry Humphrey fall with an acute right tib-fib fracture.  Assessment & Plan:   Active Problems:   Alcohol abuse   Chest pain   Leukocytosis   Tobacco abuse   Closed fracture of right tibia and fibula   Fall due to stumbling   Seizure disorder (HCC)   Tibia/fibula fracture   Closed fracture of right distal tibia  1. Acute right tib-fib fracture- this injury required operative management.  Orthopedics has been consulted.  The patient went to the OR 02/01/2019 and is POD#1.  Post op mgmt per ortho team.  Pt awaiting PT evaluation.  Pt hopeful for SNF placement.  Leg feels better after splint/dressing adjusted by ortho.  C/o pain today, will schedule APAP for Jerry Humphrey few days.  2. History of alcoholism- patient denies history of withdrawal per previous provider, we are monitoring him on CIWA protocol.  3. Atypical chest pain-RESOLVED.  Was likely secondary to GERD-high-sensitivity troponins have  been negligible x3.  He had [name] normal 2D echocardiogram for completeness.  EKG with no acute or worrisome findings.  His A1c is normal.  His lipids are okay.  4. Reactive leukocytosis-WBC slightly bumped post surgery.  5. Tobacco/alcohol use-patient counseled to avoid tobacco and alcohol especially since it could delay wound healing.  He verbalized understanding.  Will offer nicotine patch while he is in the hospital.  6. Remote history of seizure - pt not taking antiepileptics.  I wonder if he had alcohol withdrawal seizure in the past? He is not sure.   7. Fever: x1 on 7/15.  Will check blood cultures (ngtd x 5) and urine (UA negative).  Continue IS.  Pt satting well on RA.  CXR negative.  Continue to monitor.   DVT prophylaxis: lovenox Code Status: full  Family Communication: none  at bedside Disposition Plan: pending placement    Consultants:   orthopedics  Procedures:  7/12  Open reduction internal fixation of right distal displaced fibular fracture using Biomet plate with closed intramedullary nailing of displaced distal tibia fracture using Biomet VersaNail.  Echo IMPRESSIONS    1. The left ventricle has normal systolic function with an ejection fraction of 60-65%. The cavity size was normal. There is mild asymmetric left ventricular hypertrophy. Left ventricular diastolic Doppler parameters are consistent with impaired  relaxation.  2. The right ventricle has normal systolic function. The cavity was normal. There is no increase in right ventricular wall thickness.  3. Mild aortic annular calcification noted.  Antimicrobials:  Anti-infectives (From admission, onward)   Start     Dose/Rate Route Frequency Ordered Stop   02/01/19 1730  ceFAZolin (ANCEF) IVPB 2g/100 mL premix     2 g 200 mL/hr over 30 Minutes Intravenous Every 6 hours 02/01/19 1526 02/02/19 0542   02/01/19 0600  ceFAZolin (ANCEF) IVPB 2g/100 mL premix     2 g 200 mL/hr over 30 Minutes Intravenous On call to O.R. 02/01/19 0348 02/01/19 1157     Subjective: Doing ok today, no complaints  Objective: Vitals:   02/08/19 1330 02/09/19 0640 02/09/19 1029 02/09/19 1341  BP: 136/88 (!) 139/92 133/74 (!) 146/91  Pulse: 82 74  76  Resp: (!) 21 20  18   Temp: 98.9 F (37.2 C) 97.8 F (36.6 C)  98.3 F (36.8 C)  TempSrc:  Oral Oral  Oral  SpO2: 97% 97%  99%  Weight:      Height:        Intake/Output Summary (Last 24 hours) at 02/09/2019 1726 Last data filed at 02/09/2019 1700 Gross per 24 hour  Intake 960 ml  Output 625 ml  Net 335 ml   Filed Weights   02/01/19 0900  Weight: 82.6 kg    Examination:  General: No acute distress. Cardiovascular: Heart sounds show Jerry Humphrey regular rate, and rhythm. Lungs: Clear to auscultation bilaterally  Abdomen: Soft, nontender, nondistended   Neurological: Alert and oriented 3. Moves all extremities 4. Cranial nerves II through XII grossly intact. Skin: Warm and dry. No rashes or lesions. Extremities: RLE splinted       Data Reviewed: I have personally reviewed following labs and imaging studies  CBC: Recent Labs  Lab 02/03/19 0609 02/04/19 1636 02/05/19 0442 02/06/19 0531  WBC 9.5 9.3 10.1 9.3  HGB 12.2* 11.8* 13.0 12.7*  HCT 37.0* 35.3* 38.9* 38.2*  MCV 94.1 94.6 93.1 92.9  PLT 220 390 262 289   Basic Metabolic Panel: Recent Labs  Lab 02/04/19 1636 02/05/19 0442 02/06/19 0531 02/07/19 0643 02/08/19 0555  NA 133* 132* 133* 131*  --   K 3.9 3.6 4.8 4.2  --   CL 97* 96* 95* 94*  --   CO2 26 26 28 27   --   GLUCOSE 124* 105* 124* 99  --   BUN 11 12 17  22*  --   CREATININE 0.93 0.88 1.01 0.95 0.98  CALCIUM 8.8* 8.8* 9.1 8.9  --   MG  --  1.9 2.2  --   --    GFR: Estimated Creatinine Clearance: 85.1 mL/min (by C-G formula based on SCr of 0.98 mg/dL). Liver Function Tests: Recent Labs  Lab 02/05/19 0442 02/06/19 0531 02/07/19 0643  AST 23 22 28   ALT 26 26 29   ALKPHOS 54 50 55  BILITOT 0.9 1.0 0.7  PROT 7.3 7.4 7.7  ALBUMIN 3.4* 3.4* 3.4*   No results for input(s): LIPASE, AMYLASE in the last 168 hours. No results for input(s): AMMONIA in the last 168 hours. Coagulation Profile: No results for input(s): INR, PROTIME in the last 168 hours. Cardiac Enzymes: No results for input(s): CKTOTAL, CKMB, CKMBINDEX, TROPONINI in the last 168 hours. BNP (last 3 results) No results for input(s): PROBNP in the last 8760 hours. HbA1C: No results for input(s): HGBA1C in the last 72 hours. CBG: Recent Labs  Lab 02/04/19 1619  GLUCAP 116*   Lipid Profile: No results for input(s): CHOL, HDL, LDLCALC, TRIG, CHOLHDL, LDLDIRECT in the last 72 hours. Thyroid Function Tests: No results for input(s): TSH, T4TOTAL, FREET4, T3FREE, THYROIDAB in the last 72 hours. Anemia Panel: No results for input(s):  VITAMINB12, FOLATE, FERRITIN, TIBC, IRON, RETICCTPCT in the last 72 hours. Sepsis Labs: No results for input(s): PROCALCITON, LATICACIDVEN in the last 168 hours.  Recent Results (from the past 240 hour(s))  Surgical PCR screen     Status: None   Collection Time: 01/31/19  4:50 AM   Specimen: Nasal Mucosa; Nasal Swab  Result Value Ref Range Status   MRSA, PCR NEGATIVE NEGATIVE Final   Staphylococcus aureus NEGATIVE NEGATIVE Final    Comment: (NOTE) The Xpert SA Assay (FDA approved for NASAL specimens in patients 59 years of age and older), is one component of Vandella Ord comprehensive surveillance program. It is not intended to diagnose infection nor to guide or monitor treatment. Performed at [facility], [place] [address] [place], [place] [address]   Culture, blood (routine x 2)     Status: None   Collection Time: 02/04/19  4:36 PM   Specimen: BLOOD  Result Value Ref Range Status   Specimen Description   Final    BLOOD LEFT ANTECUBITAL Performed at [place] [address] [place], [place] [address]    Special Requests   Final    BOTTLES DRAWN AEROBIC ONLY Blood Culture adequate volume Performed at [place] [address], [place], [place] [address]    Culture   Final    NO GROWTH 5 DAYS Performed at [facility], [place] [address] [place], [place] [address]    Report Status 02/09/2019 FINAL  Final  Culture, blood (routine x 2)     Status: None   Collection Time: 02/04/19  4:37 PM   Specimen: BLOOD  Result Value Ref Range Status   Specimen Description   Final    BLOOD LEFT ANTECUBITAL Performed at [place] [address] [place], [place] [address]    Special Requests   Final    BOTTLES DRAWN AEROBIC ONLY Blood Culture adequate volume Performed at [place] [address] [place], [place] [address]    Culture   Final    NO GROWTH 5 DAYS Performed at [facility], [place] [address] [place], [place] [address]    Report Status 02/09/2019 FINAL  Final         Radiology Studies: No results found.      Scheduled Meds: . acetaminophen  1,000 mg Oral Q8H  . amLODipine  5 mg Oral Daily  . docusate sodium  100 mg Oral BID  . enoxaparin (LOVENOX) injection  40 mg Subcutaneous Q24H  . folic acid  1 mg Oral Daily  . multivitamin with minerals  1 tablet Oral Daily  . nicotine  21 mg Transdermal Q24H  . pantoprazole  40 mg Oral BID  . polyethylene glycol  17 g Oral BID  . senna  1 tablet Oral BID  . sucralfate  1 g Oral TID WC & HS  . thiamine  100 mg Oral Daily   Or  . thiamine  100 mg Intravenous Daily   Continuous Infusions: . methocarbamol (ROBAXIN) IV 500 mg (01/30/19 2332)     LOS: 10 days    Time spent: over 68 min    Fayrene Helper, MD [facility] Pager AMION  If 7PM-7AM, please contact night-coverage [email or web address] Password [facility] 02/09/2019, 5:26 PM

## 2019-02-09 NOTE — Progress Notes (Signed)
Orthopedics Progress Note  Subjective: Patient reports minimal pain in the right leg. He reports keeping his leg elevated and doing his exercises  Objective:  Vitals:   02/09/19 1029 02/09/19 1341  BP: 133/74 (!) 146/91  Pulse:  76  Resp:  18  Temp:  98.3 F (36.8 C)  SpO2:  99%    General: Awake and alert  Musculoskeletal: right leg elevated and splint intact. Wiggles toes and sensation intact Neurovascularly intact  Lab Results  Component Value Date   WBC 9.3 02/06/2019   HGB 12.7 (L) 02/06/2019   HCT 38.2 (L) 02/06/2019   MCV 92.9 02/06/2019   PLT 289 02/06/2019       Component Value Date/Time   NA 131 (L) 02/07/2019 0643   K 4.2 02/07/2019 0643   CL 94 (L) 02/07/2019 0643   CO2 27 02/07/2019 0643   GLUCOSE 99 02/07/2019 0643   BUN 22 (H) 02/07/2019 0643   CREATININE 0.98 02/08/2019 0555   CALCIUM 8.9 02/07/2019 0643   GFRNONAA >60 02/08/2019 0555   GFRAA >60 02/08/2019 0555    No results found for: INR, PROTIME  Assessment/Plan: POD #7 s/p Procedure(s): OPEN REDUCTION INTERNAL FIXATION (ORIF) TIBIA/FIBULA FRACTURE Continue discharge planning   Will need a rolling walker for at least two months He will need a CAM boot provided to him prior to D/C that he will bring back to the office to replace his splint later Follow up with me in two weeks in the office, thank you Thanks  Remo Lipps R. Veverly Fells, MD 02/09/2019 5:38 PM

## 2019-02-09 NOTE — Discharge Instructions (Signed)
No WEIGHT BEARING on your right leg at all! Do exercises to promote circulation Use your walker anytime you are up and around.   Keep the right leg elevated above your heart at all times.  Keep the splint clean and dry until follow up with Dr Veverly Fells at [facility]  Call [phone] for appt

## 2019-02-09 NOTE — TOC Progression Note (Signed)
Transition of Care [facility]) - Progression Note    Patient Details  Name: Jerry Humphrey MRN: [id number] Date of Birth: Sep 25, 1960  Transition of Care [facility]) CM/SW Contact  Lyndell Allaire, Juliann Pulse, RN Phone Number: 02/09/2019, 3:06 PM  Clinical Narrative:  CM in contact mgmnt for [facility] services, bed available Tuesday,Thursday this week-rep LeAnn following. Have faxed h&p,fl2,PT/OT ntoes,MD notes,med list to [employer] rep Tess-Administrator-await response if able to accept. Patient is homeless, financial counselor-reviewing for 33month impairment.    Expected Discharge Plan: Skilled Nursing Facility Barriers to Discharge: SNF Pending payor source - LOG(Awaiting LOG approval)  Expected Discharge Plan and Services Expected Discharge Plan: [place]   Discharge Planning Services: CM Consult   Living arrangements for the past 2 months: Apartment                                       Social Determinants of Health (SDOH) Interventions    Readmission Risk Interventions No flowsheet data found.

## 2019-02-09 NOTE — Progress Notes (Signed)
Nutrition Brief Note RD working remotely.   Patient identified for LOS day #10.  Wt Readings from Last 15 Encounters:  02/01/19 82.6 kg  01/18/19 74.8 kg    Body mass index is 27.69 kg/m. Patient meets criteria for overweight based on current BMI. L leg incision from 7/12. Patient was admitted after a fall with R tib/fib fx s/p surgery for the same on 7/12.  Current diet order is Regular and he has eaten mainly 100% of meals over the past 5 days. Labs and medications reviewed.   No nutrition interventions warranted at this time. If nutrition issues arise, please consult RD. MD note yesterday indicated that patient is pending placement.     Jarome Matin, MS, RD, LDN, [facility] Inpatient Clinical Dietitian Pager # [phone] After hours/weekend pager # [phone]

## 2019-02-10 LAB — COMPREHENSIVE METABOLIC PANEL
ALT: 38 U/L (ref 0–44)
AST: 31 U/L (ref 15–41)
Albumin: 3.6 g/dL (ref 3.5–5.0)
Alkaline Phosphatase: 60 U/L (ref 38–126)
Anion gap: 10 (ref 5–15)
BUN: 16 mg/dL (ref 6–20)
CO2: 26 mmol/L (ref 22–32)
Calcium: 8.8 mg/dL — ABNORMAL LOW (ref 8.9–10.3)
Chloride: 96 mmol/L — ABNORMAL LOW (ref 98–111)
Creatinine, Ser: 0.98 mg/dL (ref 0.61–1.24)
GFR calc Af Amer: >60 mL/min (ref >60)
GFR calc non Af Amer: >60 mL/min (ref >60)
Glucose, Bld: 113 mg/dL — ABNORMAL HIGH (ref 70–99)
Potassium: 3.4 mmol/L — ABNORMAL LOW (ref 3.5–5.1)
Sodium: 132 mmol/L — ABNORMAL LOW (ref 135–145)
Total Bilirubin: 0.6 mg/dL (ref 0.3–1.2)
Total Protein: 7.7 g/dL (ref 6.5–8.1)

## 2019-02-10 LAB — CBC
HCT: 38 % — ABNORMAL LOW (ref 39.0–52.0)
Hemoglobin: 12.6 g/dL — ABNORMAL LOW (ref 13.0–17.0)
MCH: 31 pg (ref 26.0–34.0)
MCHC: 33.2 g/dL (ref 30.0–36.0)
MCV: 93.6 fL (ref 80.0–100.0)
Platelets: 362 10*3/uL (ref 150–400)
RBC: 4.06 MIL/uL — ABNORMAL LOW (ref 4.22–5.81)
RDW: 11.5 % (ref 11.5–15.5)
WBC: 7.8 10*3/uL (ref 4.0–10.5)
nRBC: 0 % (ref 0.0–0.2)

## 2019-02-10 LAB — MAGNESIUM: Magnesium: 2.2 mg/dL (ref 1.7–2.4)

## 2019-02-10 MED ORDER — POTASSIUM CHLORIDE CRYS ER 20 MEQ PO TBCR
40.0000 meq | EXTENDED_RELEASE_TABLET | Freq: Once | ORAL | Status: AC
  Administered 2019-02-10: 40 meq via ORAL
  Filled 2019-02-10: qty 2

## 2019-02-10 NOTE — Progress Notes (Signed)
Physical Therapy Treatment Patient Details Name: Jerry Humphrey MRN: [id number] DOB: 08-24-1960 Today's Date: 02/10/2019    History of Present Illness 58 y.o. male with medical history significant of ETOH use, seizure. Fall due to stumbling over a tree stump with ankle deformity-Closed fracture of right tibia and fibula s/p ORIFdistal displaced tib/fibular fracture 7/12    PT Comments    Assisted OOB to amb "hop" a functional distance then used wheelchair for long distance mobility due to NWB status.   Follow Up Recommendations  Other pending personal and financial  situation      Equipment Recommendations  Wheelchair (measurements PT);3in1 (PT);Rolling walker with 5" wheels    Recommendations for Other Services       Precautions / Restrictions Precautions Precautions: Fall Required Braces or Orthoses: Splint/Cast Restrictions Weight Bearing Restrictions: Yes RLE Weight Bearing: Non weight bearing    Mobility  Bed Mobility Overal bed mobility: Modified Independent             General bed mobility comments: self able  Transfers Overall transfer level: Needs assistance Equipment used: Rolling walker (2 wheeled);None Transfers: Squat Pivot Transfers;Stand Pivot Transfers   Stand pivot transfers: Supervision Squat pivot transfers: Supervision     General transfer comment: set onlt.  Good hand placement and self awareness NWBing R LE.  Instructed to wear LEFT shoe for increased safety/stability  Ambulation/Gait Ambulation/Gait assistance: Supervision Gait Distance (Feet): 18 Feet   Gait Pattern/deviations: Step-to pattern Gait velocity: decreased   General Gait Details: increased ability to "hop" safely a functional distance   Secretary/administrator Wheelchair mobility: Yes Wheelchair propulsion: Both upper extremities;Left lower extremity Wheelchair parts: Supervision/cueing Distance: [place] Details (indicate cue type and reason): cues for safety, technique. supervision  Modified Rankin (Stroke Patients Only)       Balance                                            Cognition Arousal/Alertness: Awake/alert Behavior During Therapy: WFL for tasks assessed/performed Overall Cognitive Status: Within Functional Limits for tasks assessed                                        Exercises      General Comments        Pertinent Vitals/Pain Pain Assessment: 0-10 Pain Score: 6  Pain Location: R LE Pain Descriptors / Indicators: Throbbing Pain Intervention(s): Monitored during session;Ice applied;Repositioned    Home Living                      Prior Function            PT Goals (current goals can now be found in the care plan section) Progress towards PT goals: Progressing toward goals    Frequency    Min 3X/week      PT Plan Current plan remains appropriate    Co-evaluation              AM-PAC PT "6 Clicks" Mobility   Outcome Measure  Help needed turning from your back to your side while in a flat bed without using bedrails?: None Help needed moving from lying on your back  to sitting on the side of a flat bed without using bedrails?: None Help needed moving to and from a bed to a chair (including a wheelchair)?: None Help needed standing up from a chair using your arms (e.g., wheelchair or bedside chair)?: A Little Help needed to walk in hospital room?: A Little Help needed climbing 3-5 steps with a railing? : Total 6 Click Score: 19    End of Session Equipment Utilized During Treatment: Gait belt Activity Tolerance: Patient tolerated treatment well Patient left: in chair;with call bell/phone within reach;with chair alarm set   PT Visit Diagnosis: Unsteadiness on feet (R26.81);Muscle weakness (generalized) (M62.81);History of falling (Z91.81);Pain Pain - Right/Left: Right Pain - part of  body: Leg     Time: 1405-1430 PT Time Calculation (min) (ACUTE ONLY): 25 min  Charges:  $Gait Training: 8-22 mins $Wheel Chair Management: 8-22 mins                     Felecia ShellingLori Yolande Skoda  PTA Acute  Rehabilitation Services Pager      [phone] Office      [phone]

## 2019-02-10 NOTE — TOC Progression Note (Signed)
Transition of Care [facility]) - Progression Note    Patient Details  Name: Jerry Humphrey MRN: [id number] Date of Birth: 1961-07-14  Transition of [facility]) CM/SW Contact  Niveah Boerner, Juliann Pulse, RN Phone Number: 02/10/2019, 9:16 AM  Clinical Narrative:Noted PT/OT to see daily as back up while exploring SNF with LOG(administrator managing LOG)-[facility] [place] reviewing & [facility] also reviewing.        Expected Discharge Plan: Skilled Nursing Facility Barriers to Discharge: SNF Pending payor source - LOG(Awaiting LOG approval)  Expected Discharge Plan and Services Expected Discharge Plan: [place]   Discharge Planning Services: CM Consult   Living arrangements for the past 2 months: Apartment                                       Social Determinants of Health (SDOH) Interventions    Readmission Risk Interventions No flowsheet data found.

## 2019-02-10 NOTE — Progress Notes (Signed)
PROGRESS NOTE    Jerry Humphrey  HYI:[id number] DOB: 07/06/1961 DOA: 01/30/2019 PCP: Patient, [name]   Brief Narrative:  58 year old male with hypertension, tobacco, alcohol, and question of remote seizures presented after a fall with an acute right tib-fib fracture.  Currently pending SNF placement.  Assessment & Plan:   Active Problems:   Alcohol abuse   Chest pain   Leukocytosis   Tobacco abuse   Closed fracture of right tibia and fibula   Fall due to stumbling   Seizure disorder ([place])   Tibia/fibula fracture   Closed fracture of right distal tibia  1. Acute right tib-fib fracture- this injury required operative management.  Orthopedics has been consulted.  The patient went to the OR 02/01/2019.  Post op mgmt per ortho team.  Currently d/c pending SNF placement.  Leg feels better after splint/dressing adjusted by ortho.  APAP scheduled for a few days with prn oxy.  2. History of alcoholism- patient denies history of withdrawal per previous provider, we are monitoring him on CIWA protocol.  3. Atypical chest pain-RESOLVED.  Was likely secondary to GERD-high-sensitivity troponins have  been negligible x3.  He had a normal 2D echocardiogram for completeness.  EKG with no acute or worrisome findings.  His A1c is normal.  His lipids are okay.  4. Reactive leukocytosis-WBC slightly bumped post surgery.  5. Tobacco/alcohol use-patient counseled to avoid tobacco and alcohol especially since it could delay wound healing.  He verbalized understanding.  Will offer nicotine patch while he is in the hospital.  6. Remote history of seizure - pt not taking antiepileptics.  I wonder if he had alcohol withdrawal seizure in the past? He is not sure.   7. Fever: x1 on 7/15.  Will check blood cultures (ngtd x 5) and urine (UA negative).  Continue IS.  Pt satting well on RA.  CXR negative.  Continue to monitor.  # Hypokalemia: replace and follow   DVT prophylaxis: lovenox Code Status:  full  Family Communication: none at bedside Disposition Plan: pending placement    Consultants:   orthopedics  Procedures:  7/12  Open reduction internal fixation of right distal displaced fibular fracture using Biomet plate with closed intramedullary nailing of displaced distal tibia fracture using Biomet VersaNail.  Echo IMPRESSIONS    1. The left ventricle has normal systolic function with an ejection fraction of 60-65%. The cavity size was normal. There is mild asymmetric left ventricular hypertrophy. Left ventricular diastolic Doppler parameters are consistent with impaired  relaxation.  2. The right ventricle has normal systolic function. The cavity was normal. There is no increase in right ventricular wall thickness.  3. Mild aortic annular calcification noted.  Antimicrobials:  Anti-infectives (From admission, onward)   Start     Dose/Rate Route Frequency Ordered Stop   02/01/19 1730  ceFAZolin (ANCEF) IVPB 2g/100 mL premix     2 g 200 mL/hr over 30 Minutes Intravenous Every 6 hours 02/01/19 1526 02/02/19 0542   02/01/19 0600  ceFAZolin (ANCEF) IVPB 2g/100 mL premix     2 g 200 mL/hr over 30 Minutes Intravenous On call to O.R. 02/01/19 0348 02/01/19 1157     Subjective: No complaints today.  Objective: Vitals:   02/09/19 1700 02/09/19 2108 02/10/19 0402 02/10/19 1050  BP: (!) 139/92 138/89 118/78 110/82  Pulse: 79 79 73 79  Resp: 18 20 (!) 22 20  Temp: 98.4 F (36.9 C) 98.1 F (36.7 C) 98.6 F (37 C) 98.9 F (37.2 C)  TempSrc: Oral Oral Oral Oral  SpO2: 98% 98% 98% 96%  Weight:      Height:        Intake/Output Summary (Last 24 hours) at 02/10/2019 1454 Last data filed at 02/10/2019 0902 Gross per 24 hour  Intake 1260 ml  Output 1800 ml  Net -540 ml   Filed Weights   02/01/19 0900  Weight: 82.6 kg    Examination:  General: No acute distress. Cardiovascular: Heart sounds show a regular rate, and rhythm.  Lungs: Clear to auscultation  bilaterally Abdomen: Soft, nontender, nondistended  Neurological: Alert and oriented 3. Moves all extremities 4 . Cranial nerves II through XII grossly intact. Skin: Warm and dry. No rashes or lesions. Extremities: RLE splint   Data Reviewed: I have personally reviewed following labs and imaging studies  CBC: Recent Labs  Lab 02/04/19 1636 02/05/19 0442 02/06/19 0531 02/10/19 0547  WBC 9.3 10.1 9.3 7.8  HGB 11.8* 13.0 12.7* 12.6*  HCT 35.3* 38.9* 38.2* 38.0*  MCV 94.6 93.1 92.9 93.6  PLT 390 262 289 362   Basic Metabolic Panel: Recent Labs  Lab 02/04/19 1636 02/05/19 0442 02/06/19 0531 02/07/19 0643 02/08/19 0555 02/10/19 0547  NA 133* 132* 133* 131*  --  132*  K 3.9 3.6 4.8 4.2  --  3.4*  CL 97* 96* 95* 94*  --  96*  CO2 26 26 28 27   --  26  GLUCOSE 124* 105* 124* 99  --  113*  BUN 11 12 17  22*  --  16  CREATININE 0.93 0.88 1.01 0.95 0.98 0.98  CALCIUM 8.8* 8.8* 9.1 8.9  --  8.8*  MG  --  1.9 2.2  --   --  2.2   GFR: Estimated Creatinine Clearance: 85.1 mL/min (by C-G formula based on SCr of 0.98 mg/dL). Liver Function Tests: Recent Labs  Lab 02/05/19 0442 02/06/19 0531 02/07/19 0643 02/10/19 0547  AST 23 22 28 31   ALT 26 26 29  38  ALKPHOS 54 50 55 60  BILITOT 0.9 1.0 0.7 0.6  PROT 7.3 7.4 7.7 7.7  ALBUMIN 3.4* 3.4* 3.4* 3.6   No results for input(s): LIPASE, AMYLASE in the last 168 hours. No results for input(s): AMMONIA in the last 168 hours. Coagulation Profile: No results for input(s): INR, PROTIME in the last 168 hours. Cardiac Enzymes: No results for input(s): CKTOTAL, CKMB, CKMBINDEX, TROPONINI in the last 168 hours. BNP (last 3 results) No results for input(s): PROBNP in the last 8760 hours. HbA1C: No results for input(s): HGBA1C in the last 72 hours. CBG: Recent Labs  Lab 02/04/19 1619  GLUCAP 116*   Lipid Profile: No results for input(s): CHOL, HDL, LDLCALC, TRIG, CHOLHDL, LDLDIRECT in the last 72 hours. Thyroid Function Tests:  No results for input(s): TSH, T4TOTAL, FREET4, T3FREE, THYROIDAB in the last 72 hours. Anemia Panel: No results for input(s): VITAMINB12, FOLATE, FERRITIN, TIBC, IRON, RETICCTPCT in the last 72 hours. Sepsis Labs: No results for input(s): PROCALCITON, LATICACIDVEN in the last 168 hours.  Recent Results (from the past 240 hour(s))  Culture, blood (routine x 2)     Status: None   Collection Time: 02/04/19  4:36 PM   Specimen: BLOOD  Result Value Ref Range Status   Specimen Description   Final    BLOOD LEFT ANTECUBITAL Performed at [facility], [address] [place], [place] [address]    Special Requests   Final    BOTTLES DRAWN AEROBIC ONLY Blood Culture adequate volume  Performed at [facility], [address] [place], [place] [address]    Culture   Final    NO GROWTH 5 DAYS Performed at [facility], [address] [place], [place] [address]    Report Status 02/09/2019 FINAL  Final  Culture, blood (routine x 2)     Status: None   Collection Time: 02/04/19  4:37 PM   Specimen: BLOOD  Result Value Ref Range Status   Specimen Description   Final    BLOOD LEFT ANTECUBITAL Performed at [facility], [address] [place], [place] [address]    Special Requests   Final    BOTTLES DRAWN AEROBIC ONLY Blood Culture adequate volume Performed at [facility], [address] [place], [place] [address]    Culture   Final    NO GROWTH 5 DAYS Performed at [facility], [address] [place], [place] [address]    Report Status 02/09/2019 FINAL  Final         Radiology Studies: No results found.      Scheduled Meds: . acetaminophen  1,000 mg Oral Q8H  . amLODipine  5 mg Oral Daily  . docusate sodium  100 mg Oral BID  . enoxaparin (LOVENOX) injection  40 mg Subcutaneous Q24H  . folic acid  1 mg Oral Daily  . multivitamin with minerals  1 tablet Oral Daily  . nicotine  21 mg  Transdermal Q24H  . pantoprazole  40 mg Oral BID  . polyethylene glycol  17 g Oral BID  . senna  1 tablet Oral BID  . sucralfate  1 g Oral TID WC & HS  . thiamine  100 mg Oral Daily   Or  . thiamine  100 mg Intravenous Daily   Continuous Infusions: . methocarbamol (ROBAXIN) IV 500 mg (01/30/19 2332)     LOS: 11 days    Time spent: over 30 min    Lacretia Nicksaldwell Powell, MD [facility] Pager AMION  If 7PM-7AM, please contact night-coverage [email or web address] Password [email or web address] 02/10/2019, 2:54 PM

## 2019-02-11 LAB — NOVEL CORONAVIRUS, NAA (HOSP ORDER, SEND-OUT TO REF LAB; TAT 18-24 HRS): SARS-CoV-2, NAA: NOT DETECTED

## 2019-02-11 MED ORDER — ACETAMINOPHEN 325 MG PO TABS
650.0000 mg | ORAL_TABLET | ORAL | Status: DC | PRN
  Administered 2019-02-11 – 2019-02-15 (×6): 650 mg via ORAL
  Filled 2019-02-11 (×6): qty 2

## 2019-02-11 NOTE — Progress Notes (Signed)
PROGRESS NOTE    Jerry Humphrey  WUJ:[id number] DOB: [birth year] DOA: 01/30/2019 PCP: Patient, [name]   Brief Narrative:  58 year old male with hypertension, tobacco, alcohol, and question of remote seizures presented after a fall with an acute right tib-fib fracture.  Subjective Resting comfortably.  Pain is controlled.  Reports he walked with assistance and walker. Overnight no acute events.  Assessment & Plan:   1. Acute right tib-fib fracture- this injury required operative management.  s/p operative intervention 02/01/2019.  Post op mgmt per ortho team.  Pain is controlled, currently has a splint in place and will be changed on Friday if he is still here.  He is awaiting for placement at this time.  Remains on Lovenox for DVT prophylaxis.  2. History of alcoholism- no evidence of withdrawal symptoms.  3. Atypical chest pain: Resolved no recurrence.  Likely from GERD.  Troponin was negative x3.  2D echocardiogram w normal  4. Reactive leukocytosis, postop and resolved.  5. Tobacco/alcohol use- has been extensively counseled.    6. Remote history of seizure - pt not taking antiepileptics.  I wonder if he had alcohol withdrawal seizure in the past? He is not sure.   7. Fever: x1 on 7/15. blood cultures (ngtd x 5) and urine (UA negative).  Continue IS.  Pt satting  8. Hypokalemia Replete as needed.  DVT prophylaxis: lovenox Code Status: full  Family Communication: Discussed with the patient in detail.   Disposition Plan: pending placement    Consultants:   orthopedics  Procedures:  7/12  Open reduction internal fixation of right distal displaced fibular fracture using Biomet plate with closed intramedullary nailing of displaced distal tibia fracture using Biomet VersaNail.  Echo IMPRESSIONS    1. The left ventricle has normal systolic function with an ejection fraction of 60-65%. The cavity size was normal. There is mild asymmetric left ventricular hypertrophy.  Left ventricular diastolic Doppler parameters are consistent with impaired  relaxation.  2. The right ventricle has normal systolic function. The cavity was normal. There is no increase in right ventricular wall thickness.  3. Mild aortic annular calcification noted.  Antimicrobials:  Anti-infectives (From admission, onward)   Start     Dose/Rate Route Frequency Ordered Stop   02/01/19 1730  ceFAZolin (ANCEF) IVPB 2g/100 mL premix     2 g 200 mL/hr over 30 Minutes Intravenous Every 6 hours 02/01/19 1526 02/02/19 0542   02/01/19 0600  ceFAZolin (ANCEF) IVPB 2g/100 mL premix     2 g 200 mL/hr over 30 Minutes Intravenous On call to O.R. 02/01/19 0348 02/01/19 1157     Subjective: No complaints today.  Objective: Vitals:   02/10/19 0402 02/10/19 1050 02/10/19 2103 02/11/19 0501  BP: 118/78 110/82 (!) 135/95 (!) 154/99  Pulse: 73 79 77 76  Resp: (!) 22 20 20 19   Temp: 98.6 F (37 C) 98.9 F (37.2 C) 98.9 F (37.2 C) 98 F (36.7 C)  TempSrc: Oral Oral Oral Oral  SpO2: 98% 96% 100% 100%  Weight:      Height:        Intake/Output Summary (Last 24 hours) at 02/11/2019 1438 Last data filed at 02/11/2019 1300 Gross per 24 hour  Intake 970 ml  Output 1300 ml  Net -330 ml   Filed Weights   02/01/19 0900  Weight: 82.6 kg    Examination: General exam: Calm, comfortable, not in acute distress, older for age, average built.  HEENT:Oral mucosa moist, Ear/Nose WNL grossly, dentition  normal. Respiratory system: Bilateral equal air entry, no crackles and wheezing, no use of accessory muscle, non tender on palpation. Cardiovascular system: regular rate and rhythm, S1 & S2 heard, No JVD/murmurs. Gastrointestinal system: Abdomen soft, non-tender, non-distended, BS +. No hepatosplenomegaly palpable. Nervous System:Alert, awake and oriented at baseline. Able to move UE and LE, sensation intact. Extremities: RLE unexplained, distal toes PINK, INTACT SENSATION Skin: No rashes,no icterus.  MSK: Normal muscle bulk,tone, power  Data Reviewed: I have personally reviewed following labs and imaging studies  CBC: Recent Labs  Lab 02/04/19 1636 02/05/19 0442 02/06/19 0531 02/10/19 0547  WBC 9.3 10.1 9.3 7.8  HGB 11.8* 13.0 12.7* 12.6*  HCT 35.3* 38.9* 38.2* 38.0*  MCV 94.6 93.1 92.9 93.6  PLT 390 262 289 782   Basic Metabolic Panel: Recent Labs  Lab 02/04/19 1636 02/05/19 0442 02/06/19 0531 02/07/19 0643 02/08/19 0555 02/10/19 0547  NA 133* 132* 133* 131*  --  132*  K 3.9 3.6 4.8 4.2  --  3.4*  CL 97* 96* 95* 94*  --  96*  CO2 26 26 28 27   --  26  GLUCOSE 124* 105* 124* 99  --  113*  BUN 11 12 17  22*  --  16  CREATININE 0.93 0.88 1.01 0.95 0.98 0.98  CALCIUM 8.8* 8.8* 9.1 8.9  --  8.8*  MG  --  1.9 2.2  --   --  2.2   GFR: Estimated Creatinine Clearance: 85.1 mL/min (by C-G formula based on SCr of 0.98 mg/dL). Liver Function Tests: Recent Labs  Lab 02/05/19 0442 02/06/19 0531 02/07/19 0643 02/10/19 0547  AST 23 22 28 31   ALT 26 26 29  38  ALKPHOS 54 50 55 60  BILITOT 0.9 1.0 0.7 0.6  PROT 7.3 7.4 7.7 7.7  ALBUMIN 3.4* 3.4* 3.4* 3.6   No results for input(s): LIPASE, AMYLASE in the last 168 hours. No results for input(s): AMMONIA in the last 168 hours. Coagulation Profile: No results for input(s): INR, PROTIME in the last 168 hours. Cardiac Enzymes: No results for input(s): CKTOTAL, CKMB, CKMBINDEX, TROPONINI in the last 168 hours. BNP (last 3 results) No results for input(s): PROBNP in the last 8760 hours. HbA1C: No results for input(s): HGBA1C in the last 72 hours. CBG: Recent Labs  Lab 02/04/19 1619  GLUCAP 116*   Lipid Profile: No results for input(s): CHOL, HDL, LDLCALC, TRIG, CHOLHDL, LDLDIRECT in the last 72 hours. Thyroid Function Tests: No results for input(s): TSH, T4TOTAL, FREET4, T3FREE, THYROIDAB in the last 72 hours. Anemia Panel: No results for input(s): VITAMINB12, FOLATE, FERRITIN, TIBC, IRON, RETICCTPCT in the last 72  hours. Sepsis Labs: No results for input(s): PROCALCITON, LATICACIDVEN in the last 168 hours.  Recent Results (from the past 240 hour(s))  Culture, blood (routine x 2)     Status: None   Collection Time: 02/04/19  4:36 PM   Specimen: BLOOD  Result Value Ref Range Status   Specimen Description   Final    BLOOD LEFT ANTECUBITAL Performed at [place] [address] [place], [place] [address]    Special Requests   Final    BOTTLES DRAWN AEROBIC ONLY Blood Culture adequate volume Performed at [place] [address] [place], [place] [address]    Culture   Final    NO GROWTH 5 DAYS Performed at [facility], [place] [address], [place], [place] [address]    Report Status 02/09/2019 FINAL  Final  Culture,  blood (routine x 2)     Status: None   Collection Time: 02/04/19  4:37 PM   Specimen: BLOOD  Result Value Ref Range Status   Specimen Description   Final    BLOOD LEFT ANTECUBITAL Performed at [facility], [address] [place], [place] [address]    Special Requests   Final    BOTTLES DRAWN AEROBIC ONLY Blood Culture adequate volume Performed at [facility], [address] [place], [place] [address]    Culture   Final    NO GROWTH 5 DAYS Performed at [facility], [address] [place], [place] [address]    Report Status 02/09/2019 FINAL  Final         Radiology Studies: No results found.      Scheduled Meds: . amLODipine  5 mg Oral Daily  . docusate sodium  100 mg Oral BID  . enoxaparin (LOVENOX) injection  40 mg Subcutaneous Q24H  . folic acid  1 mg Oral Daily  . multivitamin with minerals  1 tablet Oral Daily  . nicotine  21 mg Transdermal Q24H  . pantoprazole  40 mg Oral BID  . polyethylene glycol  17 g Oral BID  . senna  1 tablet Oral BID  . sucralfate  1 g Oral TID WC & HS  . thiamine  100 mg Oral Daily   Or  . thiamine  100 mg Intravenous Daily    Continuous Infusions: . methocarbamol (ROBAXIN) IV 500 mg (01/30/19 2332)     LOS: 12 days    Time spent: over 30 min    Lanae Boastamesh Nesbit Michon, MD [facility] Pager AMION  If 7PM-7AM, please contact night-coverage [email or web address] Password [facility] 02/11/2019, 2:38 PM

## 2019-02-11 NOTE — Progress Notes (Signed)
   Subjective: 10 Days Post-Op Procedure(s) (LRB): OPEN REDUCTION INTERNAL FIXATION (ORIF) TIBIA/FIBULA FRACTURE (Right)  Pt c/o mild pain to right knee after hitting his knee on the bed rail this morning Otherwise doing fair No real changes No new symptoms or issues Still awaiting disposition Patient reports pain as mild.  Objective:   VITALS:   Vitals:   02/10/19 2103 02/11/19 0501  BP: (!) 135/95 (!) 154/99  Pulse: 77 76  Resp: 20 19  Temp: 98.9 F (37.2 C) 98 F (36.7 C)  SpO2: 100% 100%    Right knee incision healing well No erythema or drainage Splint in good position and placement  LABS Recent Labs    02/10/19 0547  HGB 12.6*  HCT 38.0*  WBC 7.8  PLT 362    Recent Labs    02/10/19 0547  NA 132*  K 3.4*  BUN 16  CREATININE 0.98  GLUCOSE 113*     Assessment/Plan: 10 Days Post-Op Procedure(s) (LRB): OPEN REDUCTION INTERNAL FIXATION (ORIF) TIBIA/FIBULA FRACTURE (Right) Still awaiting d/c May change his splint if pt still here on Friday Continue pain management Will continue to monitor his progress    Merla Riches PA-C, [facility] [employer] [facility] [address], [address], [place], [place] [address] Phone: [phone] [email or web address]  [employer]

## 2019-02-11 NOTE — TOC Progression Note (Signed)
Transition of Care [facility]) - Progression Note    Patient Details  Name: Jerry Humphrey MRN: [id number] Date of Birth: [birth year]  Transition [facility] Care [facility]) CM/SW Contact  Donevin Sainsbury, Juliann Pulse, RN Phone Number: 02/11/2019, 9:26 AM  Clinical Narrative: Patient medically stable to d/c.Spoke to [facility] rep William Hamburger CSW sho spoke for the team-unable to accept d/t safety concerns of d/c to shelter post SNF stay w/LOG. Administration stiil assessing for LOG contract-only facility still reviewing that has a bed available tomorrow with LOG including therapy,rm,board,meds. CM awaiting outcome from Administration.      Expected Discharge Plan: Skilled Nursing Facility Barriers to Discharge: SNF Pending payor source - LOG(Awaiting LOG approval)  Expected Discharge Plan and Services Expected Discharge Plan: [place]   Discharge Planning Services: CM Consult   Living arrangements for the past 2 months: Apartment                                       Social Determinants of Health (SDOH) Interventions    Readmission Risk Interventions No flowsheet data found.

## 2019-02-12 NOTE — Progress Notes (Signed)
PROGRESS NOTE    Jerry PickingJoseph E Humphrey  ZOX:[id number] DOB: 04-21-1961 DOA: 01/30/2019 PCP: Patient, [name]   Brief Narrative:  58 year old male with hypertension, tobacco, alcohol, and question of remote seizures presented after a fall with an acute right tib-fib fracture.  Subjective Seen this morning.  No acute events overnight.  Does not feel well. Has been able to walk with assistance with PT  Assessment & Plan:   1. Acute right tib-fib fracture- this injury required operative management.  s/p operative intervention 02/01/2019.  Post op mgmt per ortho team.  Pain is controlled, currently has a splint in place and will be changed on Friday if he is still here.  He is awaiting for placement at this time.  Remains on Lovenox for DVT prophylaxis.  2. History of alcoholism- no evidence of withdrawal symptoms.  3. Atypical chest pain: Resolved no recurrence.  Likely from GERD.  Troponin was negative x3.  2D echocardiogram w normal  4. Reactive leukocytosis, postop and resolved.  5. Tobacco/alcohol use- has been extensively counseled.    6. Remote history of seizure - pt not taking antiepileptics.  I wonder if he had alcohol withdrawal seizure in the past? He is not sure.   7. Fever: x1 on 7/15. blood cultures (ngtd x 5) and urine (UA negative).  Continue IS.  Pt satting  8. Hypokalemia Replete as needed.  DVT prophylaxis: lovenox Code Status: full  Family Communication: Discussed with the patient in detail.   Disposition Plan: pending placement , is in difficulty TO PLACE   Consultants:   orthopedics  Procedures:  7/12  Open reduction internal fixation of right distal displaced fibular fracture using Biomet plate with closed intramedullary nailing of displaced distal tibia fracture using Biomet VersaNail.  Echo IMPRESSIONS    1. The left ventricle has normal systolic function with an ejection fraction of 60-65%. The cavity size was normal. There is mild asymmetric left  ventricular hypertrophy. Left ventricular diastolic Doppler parameters are consistent with impaired  relaxation.  2. The right ventricle has normal systolic function. The cavity was normal. There is no increase in right ventricular wall thickness.  3. Mild aortic annular calcification noted.  Antimicrobials:  Anti-infectives (From admission, onward)   Start     Dose/Rate Route Frequency Ordered Stop   02/01/19 1730  ceFAZolin (ANCEF) IVPB 2g/100 mL premix     2 g 200 mL/hr over 30 Minutes Intravenous Every 6 hours 02/01/19 1526 02/02/19 0542   02/01/19 0600  ceFAZolin (ANCEF) IVPB 2g/100 mL premix     2 g 200 mL/hr over 30 Minutes Intravenous On call to O.R. 02/01/19 0348 02/01/19 1157     Subjective: No complaints today.  Objective: Vitals:   02/11/19 2016 02/12/19 0441 02/12/19 0954 02/12/19 1300  BP: 125/84 (!) 156/99 (!) 150/86 126/80  Pulse: 69 79  80  Resp: 16 18  16   Temp: 98.1 F (36.7 C) 98.2 F (36.8 C)  98.9 F (37.2 C)  TempSrc: Oral Oral  Oral  SpO2: 97% 99%  97%  Weight:      Height:        Intake/Output Summary (Last 24 hours) at 02/12/2019 1346 Last data filed at 02/12/2019 1125 Gross per 24 hour  Intake 1170 ml  Output 1700 ml  Net -530 ml   Filed Weights   02/01/19 0900  Weight: 82.6 kg    Examination: General exam: Calm, comfortable, not in acute distress, older for age, average built.  HEENT:Oral mucosa  moist, Ear/Nose WNL grossly, dentition normal. Respiratory system: Bilateral equal air entry, no crackles and wheezing, no use of accessory muscle, non tender on palpation. Cardiovascular system: regular rate and rhythm, S1 & S2 heard, No JVD/murmurs. Gastrointestinal system: Abdomen soft, non-tender, non-distended, BS +. No hepatosplenomegaly palpable. Nervous System:Alert, awake and oriented at baseline. Able to move UE and LE, sensation intact. Extremities: Right lower extremity dressing/splint cast intact distal toes visible pink with intact  sensation.  Skin: No rashes,no icterus. MSK: Normal muscle bulk,tone, power  Data Reviewed: I have personally reviewed following labs and imaging studies  CBC: Recent Labs  Lab 02/06/19 0531 02/10/19 0547  WBC 9.3 7.8  HGB 12.7* 12.6*  HCT 38.2* 38.0*  MCV 92.9 93.6  PLT 289 027   Basic Metabolic Panel: Recent Labs  Lab 02/06/19 0531 02/07/19 0643 02/08/19 0555 02/10/19 0547  NA 133* 131*  --  132*  K 4.8 4.2  --  3.4*  CL 95* 94*  --  96*  CO2 28 27  --  26  GLUCOSE 124* 99  --  113*  BUN 17 22*  --  16  CREATININE 1.01 0.95 0.98 0.98  CALCIUM 9.1 8.9  --  8.8*  MG 2.2  --   --  2.2   GFR: Estimated Creatinine Clearance: 85.1 mL/min (by C-G formula based on SCr of 0.98 mg/dL). Liver Function Tests: Recent Labs  Lab 02/06/19 0531 02/07/19 0643 02/10/19 0547  AST 22 28 31   ALT 26 29 38  ALKPHOS 50 55 60  BILITOT 1.0 0.7 0.6  PROT 7.4 7.7 7.7  ALBUMIN 3.4* 3.4* 3.6   No results for input(s): LIPASE, AMYLASE in the last 168 hours. No results for input(s): AMMONIA in the last 168 hours. Coagulation Profile: No results for input(s): INR, PROTIME in the last 168 hours. Cardiac Enzymes: No results for input(s): CKTOTAL, CKMB, CKMBINDEX, TROPONINI in the last 168 hours. BNP (last 3 results) No results for input(s): PROBNP in the last 8760 hours. HbA1C: No results for input(s): HGBA1C in the last 72 hours. CBG: No results for input(s): GLUCAP in the last 168 hours. Lipid Profile: No results for input(s): CHOL, HDL, LDLCALC, TRIG, CHOLHDL, LDLDIRECT in the last 72 hours. Thyroid Function Tests: No results for input(s): TSH, T4TOTAL, FREET4, T3FREE, THYROIDAB in the last 72 hours. Anemia Panel: No results for input(s): VITAMINB12, FOLATE, FERRITIN, TIBC, IRON, RETICCTPCT in the last 72 hours. Sepsis Labs: No results for input(s): PROCALCITON, LATICACIDVEN in the last 168 hours.  Recent Results (from the past 240 hour(s))  Culture, blood (routine x 2)      Status: None   Collection Time: 02/04/19  4:36 PM   Specimen: BLOOD  Result Value Ref Range Status   Specimen Description   Final    BLOOD LEFT ANTECUBITAL Performed at [place] [address] [place], [place] [address]    Special Requests   Final    BOTTLES DRAWN AEROBIC ONLY Blood Culture adequate volume Performed at [place] [address] [place], [place] [address]    Culture   Final    NO GROWTH 5 DAYS Performed at [facility], [place] [address], [place], [place] [address]    Report Status 02/09/2019 FINAL  Final  Culture, blood (routine x 2)     Status: None   Collection Time: 02/04/19  4:37 PM   Specimen: BLOOD  Result Value Ref Range Status   Specimen Description   Final  BLOOD LEFT ANTECUBITAL Performed at [facility], [address] [place], [place] [address]    Special Requests   Final    BOTTLES DRAWN AEROBIC ONLY Blood Culture adequate volume Performed at [facility], [address] [place], [place] [address]    Culture   Final    NO GROWTH 5 DAYS Performed at [facility], [address] [place], [place] [address]    Report Status 02/09/2019 FINAL  Final  Novel Coronavirus, NAA (hospital order; send-out to ref lab)     Status: None   Collection Time: 02/09/19 10:00 AM   Specimen: Nasopharyngeal Swab; Respiratory  Result Value Ref Range Status   SARS-CoV-2, NAA NOT DETECTED NOT DETECTED Final    Comment: (NOTE) Testing was performed using the cobas(R) SARS-CoV-2 test. This test was developed and its performance characteristics determined by [facility]. This test has not been FDA cleared or approved. This test has been authorized by FDA under an Emergency Use Authorization (EUA). This test is only authorized for the duration of time the declaration that circumstances exist justifying the authorization of the emergency use of in vitro diagnostic  tests for detection of SARS-CoV-2 virus and/or diagnosis of COVID-19 infection under section 564(b)(1) of the Act, 21 U.S.C. 562ZHY-8(M)(5360bbb-3(b)(1), unless the authorization is terminated or revoked sooner. When diagnostic testing is negative, the possibility of a false negative result should be considered in the context of a patient's recent exposures and the presence of clinical signs and symptoms consistent with COVID-19. An individual without symptoms of COVID-19 and who is not shedding SARS-CoV-2 virus would expect to have  a negative (not detected) result in this assay. Performed At: [facility] [place] [address] [place], [place] [id number] Jolene SchimkeNagendra Sanjai MD MW:[id number]:[phone]    Coronavirus Source NASOPHARYNGEAL  Final    Comment: Performed at [facility] [place] [facility], [address] [place], [place] [address]         Radiology Studies: No results found.      Scheduled Meds: . amLODipine  5 mg Oral Daily  . docusate sodium  100 mg Oral BID  . enoxaparin (LOVENOX) injection  40 mg Subcutaneous Q24H  . folic acid  1 mg Oral Daily  . multivitamin with minerals  1 tablet Oral Daily  . nicotine  21 mg Transdermal Q24H  . pantoprazole  40 mg Oral BID  . polyethylene glycol  17 g Oral BID  . senna  1 tablet Oral BID  . sucralfate  1 g Oral TID WC & HS  . thiamine  100 mg Oral Daily   Or  . thiamine  100 mg Intravenous Daily   Continuous Infusions: . methocarbamol (ROBAXIN) IV 500 mg (01/30/19 2332)     LOS: 13 days    Time spent: over 30 min    Lanae Boastamesh Marayah Higdon, MD [facility] Pager AMION  If 7PM-7AM, please contact night-coverage [email or web address] Password [email or web address] 02/12/2019, 1:46 PM

## 2019-02-12 NOTE — TOC Progression Note (Addendum)
Transition of Care [facility]) - Progression Note    Patient Details  Name: Jerry Humphrey MRN: [id number] Date of Birth: 02-02-1961  Transition of Care [facility]) CM/SW [place], [place] Phone Number: [phone] 02/12/2019, 2:23 PM  Clinical Narrative:  [facility] unable to offer LOG acceptance for pt unless LOG includes meds per company policy (admissions [place]). LOG approval is for therapy and room/board, medications not included. TOC AD aware and considering how to proceed with placement options for pt, will advise.      Expected Discharge Plan: Skilled Nursing Facility Barriers to Discharge: SNF Pending payor source - LOG(Awaiting LOG approval)  Expected Discharge Plan and Services Expected Discharge Plan: [place]   Discharge Planning Services: CM Consult   Living arrangements for the past 2 months: Apartment                                       Social Determinants of Health (SDOH) Interventions    Readmission Risk Interventions No flowsheet data found.

## 2019-02-12 NOTE — Progress Notes (Signed)
OT Cancellation Note  Patient Details Name: Jerry Humphrey MRN: [id number] DOB: 04-30-1961   Cancelled Treatment:    Reason Eval/Treat Not Completed: Pain limiting ability to participate despite premedication  [name] 02/12/2019, 11:54 AM  Lesle Chris, OTR/L Acute Rehabilitation Services [phone] [facility] pager [phone] office 02/12/2019

## 2019-02-13 NOTE — Progress Notes (Signed)
PROGRESS NOTE    Jerry Humphrey  EXB:[id number] DOB: 1960/10/21 DOA: 01/30/2019 PCP: Patient, [name]   Brief Narrative:  58 year old male with hypertension, tobacco, alcohol, and question of remote seizures presented after a fall with an acute right tib-fib fracture.  Subjective  Seen this morning, was having his meal.  Pain is stable.  No nausea vomiting fever chills.  No new complaints.    Assessment & Plan:  Acute right tib-fib fracture secondary to injury needing operative intervention 02/01/2019.  Post op doing well pain is controlled on current Tylenol, opiates and on Lovenox for DVT prophylaxis.  Continue nonweightbearing on right lower extremity PT OT.  Awaiting for skilled nursing facility placement. Splint in place and will be changed on Friday  History of alcoholism- no evidence of withdrawal symptoms. Atypical chest pain: Resolved no recurrence.  Likely from GERD.  Troponin was negative x3.  2D echocardiogram w normal Reactive leukocytosis, postop and resolved. Tobacco/alcohol use- has been extensively counseled.  Remote history of seizure - pt not taking antiepileptics.  I wonder if he had alcohol withdrawal seizure in the past? He is not sure. Fever: x1 on 7/15. blood cultures (ngtd x 5) and urine (UA negative).  Continue incentive spirometry. Hypokalemia Repleted  DVT prophylaxis: lovenox Code Status: full  Family Communication: Discussed with the patient in detail.   Disposition Plan: pending placement , is in difficulty TO PLACE per CM " [facility] to accept [facility] offer for LOG. Expected Discharge Plan: Skilled Nursing Facility. Barriers to Discharge: SNF Pending payor source - LOG(Awaiting LOG approval)"  Consultants:   orthopedics  Procedures:  7/12  Open reduction internal fixation of right distal displaced fibular fracture using Biomet plate with closed intramedullary nailing of displaced distal tibia fracture using Biomet VersaNail.  Echo  IMPRESSIONS    1. The left ventricle has normal systolic function with an ejection fraction of 60-65%. The cavity size was normal. There is mild asymmetric left ventricular hypertrophy. Left ventricular diastolic Doppler parameters are consistent with impaired  relaxation.  2. The right ventricle has normal systolic function. The cavity was normal. There is no increase in right ventricular wall thickness.  3. Mild aortic annular calcification noted.  Antimicrobials:  Anti-infectives (From admission, onward)   Start     Dose/Rate Route Frequency Ordered Stop   02/01/19 1730  ceFAZolin (ANCEF) IVPB 2g/100 mL premix     2 g 200 mL/hr over 30 Minutes Intravenous Every 6 hours 02/01/19 1526 02/02/19 0542   02/01/19 0600  ceFAZolin (ANCEF) IVPB 2g/100 mL premix     2 g 200 mL/hr over 30 Minutes Intravenous On call to O.R. 02/01/19 0348 02/01/19 1157     Subjective: No complaints today.  Objective: Vitals:   02/12/19 0954 02/12/19 1300 02/12/19 2147 02/13/19 0509  BP: (!) 150/86 126/80 (!) 135/96 (!) 154/96  Pulse:  80 84 66  Resp:  16 16 16   Temp:  98.9 F (37.2 C) 99.6 F (37.6 C) 97.9 F (36.6 C)  TempSrc:  Oral Oral Oral  SpO2:  97% 97% 97%  Weight:      Height:        Intake/Output Summary (Last 24 hours) at 02/13/2019 1110 Last data filed at 02/13/2019 0631 Gross per 24 hour  Intake 960 ml  Output 1250 ml  Net -290 ml   Filed Weights   02/01/19 0900  Weight: 82.6 kg    Examination: General exam: Calm, comfortable, not in acute distress, older  for age, average built.  HEENT:Oral mucosa moist, Ear/Nose WNL grossly, dentition normal. Respiratory system: Bilateral equal air entry, no crackles and wheezing, no use of accessory muscle, non tender on palpation. Cardiovascular system: regular rate and rhythm, S1 & S2 heard, No JVD/murmurs. Gastrointestinal system: Abdomen soft, non-tender, non-distended, BS +. No hepatosplenomegaly palpable. Nervous System:Alert, awake  and oriented at baseline. Able to move UE and LE, sensation intact. Extremities: Rt Lower extremity with a splint/dressing intact, distal toes visible and pinkish with intact sensation.   Skin: No rashes,no icterus. MSK: Normal muscle bulk,tone, power  Data Reviewed: I have personally reviewed following labs and imaging studies  CBC: Recent Labs  Lab 02/10/19 0547  WBC 7.8  HGB 12.6*  HCT 38.0*  MCV 93.6  PLT 362   Basic Metabolic Panel: Recent Labs  Lab 02/07/19 0643 02/08/19 0555 02/10/19 0547  NA 131*  --  132*  K 4.2  --  3.4*  CL 94*  --  96*  CO2 27  --  26  GLUCOSE 99  --  113*  BUN 22*  --  16  CREATININE 0.95 0.98 0.98  CALCIUM 8.9  --  8.8*  MG  --   --  2.2   GFR: Estimated Creatinine Clearance: 85.1 mL/min (by C-G formula based on SCr of 0.98 mg/dL). Liver Function Tests: Recent Labs  Lab 02/07/19 0643 02/10/19 0547  AST 28 31  ALT 29 38  ALKPHOS 55 60  BILITOT 0.7 0.6  PROT 7.7 7.7  ALBUMIN 3.4* 3.6   No results for input(s): LIPASE, AMYLASE in the last 168 hours. No results for input(s): AMMONIA in the last 168 hours. Coagulation Profile: No results for input(s): INR, PROTIME in the last 168 hours. Cardiac Enzymes: No results for input(s): CKTOTAL, CKMB, CKMBINDEX, TROPONINI in the last 168 hours. BNP (last 3 results) No results for input(s): PROBNP in the last 8760 hours. HbA1C: No results for input(s): HGBA1C in the last 72 hours. CBG: No results for input(s): GLUCAP in the last 168 hours. Lipid Profile: No results for input(s): CHOL, HDL, LDLCALC, TRIG, CHOLHDL, LDLDIRECT in the last 72 hours. Thyroid Function Tests: No results for input(s): TSH, T4TOTAL, FREET4, T3FREE, THYROIDAB in the last 72 hours. Anemia Panel: No results for input(s): VITAMINB12, FOLATE, FERRITIN, TIBC, IRON, RETICCTPCT in the last 72 hours. Sepsis Labs: No results for input(s): PROCALCITON, LATICACIDVEN in the last 168 hours.  Recent Results (from the past  240 hour(s))  Culture, blood (routine x 2)     Status: None   Collection Time: 02/04/19  4:36 PM   Specimen: BLOOD  Result Value Ref Range Status   Specimen Description   Final    BLOOD LEFT ANTECUBITAL Performed at [facility], [address] [place], [place] [address]    Special Requests   Final    BOTTLES DRAWN AEROBIC ONLY Blood Culture adequate volume Performed at [facility], [address] [place], [place] [address]    Culture   Final    NO GROWTH 5 DAYS Performed at [facility], [address] [place], [place] [address]    Report Status 02/09/2019 FINAL  Final  Culture, blood (routine x 2)     Status: None   Collection Time: 02/04/19  4:37 PM   Specimen: BLOOD  Result Value Ref Range Status   Specimen Description   Final    BLOOD LEFT ANTECUBITAL Performed at [facility], [address]  [place], [place] [address]    Special Requests   Final    BOTTLES DRAWN AEROBIC ONLY Blood Culture adequate volume Performed at [place] [address] [place], [place] [address]    Culture   Final    NO GROWTH 5 DAYS Performed at [facility], [place] [address], [place], [place] [address]    Report Status 02/09/2019 FINAL  Final  Novel Coronavirus, NAA (hospital order; send-out to ref lab)     Status: None   Collection Time: 02/09/19 10:00 AM   Specimen: Nasopharyngeal Swab; Respiratory  Result Value Ref Range Status   SARS-CoV-2, NAA NOT DETECTED NOT DETECTED Final    Comment: (NOTE) Testing was performed using the cobas(R) SARS-CoV-2 test. This test was developed and its performance characteristics determined by [employer]. This test has not been FDA cleared or approved. This test has been authorized by FDA under an Emergency Use Authorization (EUA). This test is only authorized for the duration of time the declaration that circumstances exist justifying the  authorization of the emergency use of in vitro diagnostic tests for detection of SARS-CoV-2 virus and/or diagnosis of COVID-19 infection under section 564(b)(1) of the Act, 21 U.S.C. 010XNA-3(F)(5), unless the authorization is terminated or revoked sooner. When diagnostic testing is negative, the possibility of a false negative result should be considered in the context of a patient's recent exposures and the presence of clinical signs and symptoms consistent with COVID-19. An individual without symptoms of COVID-19 and who is not shedding SARS-CoV-2 virus would expect to have  a negative (not detected) result in this assay. Performed At: [facility] [place], [place] [id number] Rush Farmer MD HC:[id number]    [place]  Final    Comment: Performed at [place] [address], [place], [place] [address]         Radiology Studies: No results found.      Scheduled Meds: . amLODipine  5 mg Oral Daily  . docusate sodium  100 mg Oral BID  . enoxaparin (LOVENOX) injection  40 mg Subcutaneous Q24H  . folic acid  1 mg Oral Daily  . multivitamin with minerals  1 tablet Oral Daily  . nicotine  21 mg Transdermal Q24H  . pantoprazole  40 mg Oral BID  . polyethylene glycol  17 g Oral BID  . senna  1 tablet Oral BID  . sucralfate  1 g Oral TID WC & HS  . thiamine  100 mg Oral Daily   Or  . thiamine  100 mg Intravenous Daily   Continuous Infusions: . methocarbamol (ROBAXIN) IV 500 mg (01/30/19 2332)     LOS: 14 days    Time spent: over 64 min    Antonieta Pert, MD [facility] Pager AMION  If 7PM-7AM, please contact night-coverage [email or web address] Password [email or web address] 02/13/2019, 11:10 AM

## 2019-02-13 NOTE — Progress Notes (Signed)
  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  A walker alone will not resolve the issues with performing activities of daily living. A wheelchair will allow patient to safely perform daily activities.  The patient can self propel in the home or has a caregiver who can provide assistance.      Kenyon Ana, PT  Pager: [phone] Acute Rehab Dept [facility]): [phone]   02/13/2019

## 2019-02-13 NOTE — Progress Notes (Signed)
Physical Therapy Treatment Patient Details Name: Jerry Humphrey MRN: [id number] DOB: Jun 02, 1961 Today's Date: 02/13/2019    History of Present Illness 58 y.o. male with medical history significant of ETOH use, seizure. Fall due to stumbling over a tree stump with ankle deformity-Closed fracture of right tibia and fibula s/p ORIFdistal displaced tib/fibular fracture 7/12    PT Comments    Pt progressing, incr gait distance today. Pt will likely need w/c for home for safety as he states it is a very long walk to get to the door of his brother"s home. Will continue to follow. Pt sleepy but cooperative with PT  Follow Up Recommendations  Supervision for mobility/OOB     Equipment Recommendations  Wheelchair (measurements PT);3in1 (PT);Rolling walker with 5" wheels    Recommendations for Other Services       Precautions / Restrictions Precautions Precautions: Fall Restrictions Weight Bearing Restrictions: Yes RLE Weight Bearing: Non weight bearing    Mobility  Bed Mobility   Bed Mobility: Supine to Sit     Supine to sit: Modified independent (Device/Increase time)     General bed mobility comments: no physical assist, no use of rails, bed flat  Transfers Overall transfer level: Needs assistance Equipment used: Rolling walker (2 wheeled) Transfers: Sit to/from Stand Sit to Stand: Min guard         General transfer comment: min/guard for safety with sit to stand, cues for hand placement and to control descent  Ambulation/Gait Ambulation/Gait assistance: Min guard Gait Distance (Feet): 80 Feet Assistive device: Rolling walker (2 wheeled) Gait Pattern/deviations: Step-to pattern     General Gait Details: increased ability to "hop" safely a functional distance, maintains NWB, improved ability to safely wt shift to UEs   Stairs             Wheelchair Mobility    Modified Rankin (Stroke Patients Only)       Balance   Sitting-balance support: No  upper extremity supported;Feet supported Sitting balance-Leahy Scale: Good     Standing balance support: Bilateral upper extremity supported Standing balance-Leahy Scale: Poor                              Cognition Arousal/Alertness: Awake/alert Behavior During Therapy: WFL for tasks assessed/performed Overall Cognitive Status: Within Functional Limits for tasks assessed                                        Exercises      General Comments        Pertinent Vitals/Pain Pain Assessment: 0-10 Pain Score: 4  Pain Location: R LE Pain Descriptors / Indicators: Aching Pain Intervention(s): Monitored during session;Repositioned    Home Living                      Prior Function            PT Goals (current goals can now be found in the care plan section) Acute Rehab PT Goals Patient Stated Goal: to go to rehab then home, I can't manage by myself at home PT Goal Formulation: With patient Time For Goal Achievement: 02/16/19 Potential to Achieve Goals: Good Progress towards PT goals: Progressing toward goals    Frequency    Min 3X/week      PT Plan Current plan remains appropriate  Co-evaluation              AM-PAC PT "6 Clicks" Mobility   Outcome Measure  Help needed turning from your back to your side while in a flat bed without using bedrails?: None Help needed moving from lying on your back to sitting on the side of a flat bed without using bedrails?: None Help needed moving to and from a bed to a chair (including a wheelchair)?: None Help needed standing up from a chair using your arms (e.g., wheelchair or bedside chair)?: A Little Help needed to walk in hospital room?: A Little Help needed climbing 3-5 steps with a railing? : A Lot 6 Click Score: 20    End of Session Equipment Utilized During Treatment: Gait belt Activity Tolerance: Patient tolerated treatment well Patient left: in chair;with call bell/phone  within reach;with chair alarm set   PT Visit Diagnosis: Unsteadiness on feet (R26.81);Muscle weakness (generalized) (M62.81);History of falling (Z91.81);Pain Pain - Right/Left: Right Pain - part of body: Leg     Time: 1429-1446 PT Time Calculation (min) (ACUTE ONLY): 17 min  Charges:  $Gait Training: 8-22 mins                     Jerry Humphrey, PT  Pager: [phone] Acute Rehab Dept [facility]): [phone]   02/13/2019    [facility],Jerry Humphrey 02/13/2019, 2:52 PM

## 2019-02-13 NOTE — Progress Notes (Signed)
Occupational Therapy Treatment; goals upgraded Patient Details Name: Jerry Humphrey MRN: [id number] DOB: 10/29/1960 Today's Date: 02/13/2019    History of present illness 58 y.o. male with medical history significant of ETOH use, seizure. Fall due to stumbling over a tree stump with ankle deformity-Closed fracture of right tibia and fibula s/p ORIFdistal displaced tib/fibular fracture 7/12   OT comments  Upgraded goals today.  Pt performing bathing from seated now with leaning. Will focus on gathering items and sit to stand for clothing management  Follow Up Recommendations  SNF;Supervision/Assistance - 24 hour    Equipment Recommendations  3 in 1 bedside commode    Recommendations for Other Services      Precautions / Restrictions Precautions Precautions: Fall Required Braces or Orthoses: Splint/Cast Splint/Cast: R lower leg Restrictions Weight Bearing Restrictions: Yes RLE Weight Bearing: Non weight bearing       Mobility Bed Mobility   Bed Mobility: Supine to Sit     Supine to sit: Modified independent (Device/Increase time)     General bed mobility comments: no physical assist, no use of rails, bed flat  Transfers Overall transfer level: Needs assistance Equipment used: Rolling walker (2 wheeled) Transfers: Sit to/from Stand Sit to Stand: Supervision;Min guard         General transfer comment: for safety    Balance   Sitting-balance support: No upper extremity supported;Feet supported Sitting balance-Leahy Scale: Good     Standing balance support: Bilateral upper extremity supported Standing balance-Leahy Scale: Poor                             ADL either performed or assessed with clinical judgement   ADL                                         General ADL Comments: simulated ADL from seated.  Pt able to weight shift to perform hygiene. 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  Pt prefers to sit  for grooming tasks.  Stated he would like to walk "hop" again.  Did this at min guard level for safety, as he is NWB.  Pt steady but lifts RW with turns.     Vision       Perception     Praxis      Cognition Arousal/Alertness: Awake/alert Behavior During Therapy: WFL for tasks assessed/performed Overall Cognitive Status: Within Functional Limits for tasks assessed                                          Exercises     Shoulder Instructions       General Comments      Pertinent Vitals/ Pain       Pain Assessment: 0-10 Pain Score: 4  Faces Pain Scale: Hurts little more Pain Location: R LE Pain Descriptors / Indicators: Aching Pain Intervention(s): Limited activity within patient's tolerance;Monitored during session;Premedicated before session;Repositioned  Home Living                                          Prior Functioning/Environment  Frequency  Min 2X/week        Progress Toward Goals  OT Goals(current goals can now be found in the care plan section)  Progress towards OT goals: (goals upgraded)  Acute Rehab OT Goals Patient Stated Goal: to go to rehab then home, I can't manage by myself at home Time For Goal Achievement: 02/27/19 ADL Goals Pt Will Perform Grooming: (discontinue) Pt Will Perform Lower Body Bathing: (discontinue) Pt Will Perform Lower Body Dressing: (discontinue) Additional ADL Goal #1: pt will gather clothes at supervison level from either W/C or with RW and perform clothing management  with supervision level sit to stand  Plan      Co-evaluation                 AM-PAC OT "6 Clicks" Daily Activity     Outcome Measure   Help from another person eating meals?: None Help from another person taking care of personal grooming?: A Little Help from another person toileting, which includes using toliet, bedpan, or urinal?: A Little Help from another person bathing (including  washing, rinsing, drying)?: A Little Help from another person to put on and taking off regular upper body clothing?: A Little Help from another person to put on and taking off regular lower body clothing?: A Little 6 Click Score: 19    End of Session    OT Visit Diagnosis: Unsteadiness on feet (R26.81);Other abnormalities of gait and mobility (R26.89);Muscle weakness (generalized) (M62.81);Pain Pain - Right/Left: Right Pain - part of body: Leg   Activity Tolerance Patient limited by pain   Patient Left in chair;with call bell/phone within reach;with chair alarm set   Nurse Communication          Time: [id number] OT Time Calculation (min): 24 min  Charges: OT General Charges $OT Visit: 1 Visit OT Treatments $Self Care/Home Management : 8-22 mins $Therapeutic Activity: 8-22 mins  Marica OtterMaryellen Mayre Bury, OTR/L Acute Rehabilitation Services [phone] [facility] pager [phone] office 02/13/2019   Samiyyah Moffa 02/13/2019, 3:56 PM

## 2019-02-13 NOTE — TOC Progression Note (Signed)
Transition of Care [facility]) - Progression Note    Patient Details  Name: Jerry Humphrey MRN: [id number] Date of Birth: 09-22-1960  Transition of Care [facility]) CM/SW Contact  Maigan Bittinger, Juliann Pulse, RN Phone Number: 02/13/2019, 8:33 AM  Clinical Narrative: Received update from Leadership-unable to accept [place] offer for [place] rep LeAnn-voiced understanding. Per Leadership will reach out to [employer] [place] for LOG-await outcome to present to Leadership.      Expected Discharge Plan: Skilled Nursing Facility Barriers to Discharge: SNF Pending payor source - LOG(Awaiting LOG approval)  Expected Discharge Plan and Services Expected Discharge Plan: [place]   Discharge Planning Services: CM Consult   Living arrangements for the past 2 months: Apartment                                       Social Determinants of Health (SDOH) Interventions    Readmission Risk Interventions No flowsheet data found.

## 2019-02-13 NOTE — TOC Progression Note (Signed)
Transition of Care [facility]) - Progression Note    Patient Details  Name: Jerry Humphrey MRN: [id number] Date of Birth: 1961/02/22  Transition of Care [facility]) CM/SW Contact  Jerry Humphrey, Jerry Pulse, RN Phone Number: 02/13/2019, 12:52 PM  Clinical Narrative: Jerry Humphrey to patient in rm about d/c plans-he provided w/correct tel# for his brother Jerry Humphrey c#[phone]. Patient can stay with Jerry Humphrey but he will not be able to accept him until Monday or Tuesday in his home. Jerry Humphrey following for charity-Leadership to discuss LOG for HHC-await update. DMe needed-[employer] Jerry Humphrey aware w/c, rw-may be provided by ortho tech since rw cam needed. Jerry Humphrey will provide transportation home.    Expected Discharge Plan: Skilled Nursing Facility Barriers to Discharge: Other (comment)(patient will stay with his brother Jerry Humphrey who can accept him either Monday or Tuesday)  Expected Discharge Plan and Services Expected Discharge Plan: [place]   Discharge Planning Services: CM Consult   Living arrangements for the past 2 months: [place]: [place] Date [facility] Agency Contacted: 02/13/19 Time [place]: 1252 Representative spoke with at [place]: [place] ([place]) Interventions    Readmission Risk Interventions No flowsheet data found.

## 2019-02-13 NOTE — TOC Progression Note (Signed)
Transition of Care [facility]) - Progression Note    Patient Details  Name: Jerry Humphrey MRN: [id number] Date of Birth: 1960-09-03  Transition of Care [facility]) CM/SW Contact  Suanne Minahan, Juliann Pulse, RN Phone Number: 02/13/2019, 3:43 PM  Clinical Narrative: Alvis Lemmings is following for LOG rep [place] aware-HHPT/OT-dme rep Thedore Mins w/[employer] following for w/c rw,3n1.      Expected Discharge Plan: Skilled Nursing Facility Barriers to Discharge: Other (comment)(patient will stay with his brother [name] who can accept him either Monday or Tuesday)  Expected Discharge Plan and Services Expected Discharge Plan: [place]   Discharge Planning Services: CM Consult   Living arrangements for the past 2 months: [place]: [place] Date [facility] Agency Contacted: 02/13/19 Time [place]: 1252 Representative spoke with at [place]: [place] ([place]) Interventions    Readmission Risk Interventions No flowsheet data found.

## 2019-02-13 NOTE — Progress Notes (Signed)
OT Cancellation Note  Patient Details Name: Jerry Humphrey MRN: [id number] DOB: Oct 28, 1960   Cancelled Treatment:    Reason Eval/Treat Not Completed: Other (comment).  Pt sleepy and doesn't want to get OOB. If schedule permits, I'll check back later today, if not, we will check back next week.  Kaelynne Christley 02/13/2019, 1:57 PM  Lesle Chris, OTR/L Acute Rehabilitation Services [phone] [facility] pager [phone] office 02/13/2019

## 2019-02-14 LAB — CBC
HCT: 39.3 % (ref 39.0–52.0)
Hemoglobin: 12.7 g/dL — ABNORMAL LOW (ref 13.0–17.0)
MCH: 30.2 pg (ref 26.0–34.0)
MCHC: 32.3 g/dL (ref 30.0–36.0)
MCV: 93.3 fL (ref 80.0–100.0)
Platelets: 458 10*3/uL — ABNORMAL HIGH (ref 150–400)
RBC: 4.21 MIL/uL — ABNORMAL LOW (ref 4.22–5.81)
RDW: 11.6 % (ref 11.5–15.5)
WBC: 6.9 10*3/uL (ref 4.0–10.5)
nRBC: 0 % (ref 0.0–0.2)

## 2019-02-14 LAB — BASIC METABOLIC PANEL
Anion gap: 11 (ref 5–15)
BUN: 16 mg/dL (ref 6–20)
CO2: 27 mmol/L (ref 22–32)
Calcium: 9.3 mg/dL (ref 8.9–10.3)
Chloride: 98 mmol/L (ref 98–111)
Creatinine, Ser: 0.95 mg/dL (ref 0.61–1.24)
GFR calc Af Amer: >60 mL/min (ref >60)
GFR calc non Af Amer: >60 mL/min (ref >60)
Glucose, Bld: 98 mg/dL (ref 70–99)
Potassium: 4.2 mmol/L (ref 3.5–5.1)
Sodium: 136 mmol/L (ref 135–145)

## 2019-02-14 NOTE — Progress Notes (Signed)
PROGRESS NOTE    Jerry Humphrey  HYQ:[id number] DOB: November 17, 1960 DOA: 01/30/2019 PCP: Patient, [name]   Brief Narrative:  58 year old male with hypertension, tobacco, alcohol, and question of remote seizures presented after a fall with an acute right tib-fib fracture. Patient is doing well postop.  He will need skilled nursing facility placement. He is awaiting for placement, and having difficulty due to lack of payer source.  Subjective Resting well no new complaints, pain control. Assessment & Plan:  Acute right tib-fib fracture secondary to injury needing operative intervention 02/01/2019.  Post op doing well pain is controlled on current Tylenol, opiates and on Lovenox for DVT prophylaxis.  Continue nonweightbearing on right lower extremity PT OT.  Awaiting for skilled nursing facility placement. Splint in place and will be changed on Friday  History of alcoholism- no evidence of withdrawal symptoms. Atypical chest pain: Resolved no recurrence.  Likely from GERD.  Troponin was negative x3.  2D echocardiogram w normal Reactive leukocytosis, postop and resolved. Tobacco/alcohol use- has been extensively counseled.  Remote history of seizure - pt not taking antiepileptics.  I wonder if he had alcohol withdrawal seizure in the past? He is not sure. Fever: x1 on 7/15. blood cultures (ngtd x 5) and urine (UA negative).  Continue incentive spirometry. Hypokalemia Repleted  DVT prophylaxis: lovenox Code Status: full  Family Communication: Discussed with the patient in detail.   Disposition Plan: pending placement , is in difficulty TO PLACE per CM " unable to accept [place] offer for LOG. Expected Discharge Plan: Jerry Humphrey. Barriers to Discharge: SNF Pending payor source - LOG(Awaiting LOG approval)"  Consultants:   orthopedics  Procedures:  7/12  Open reduction internal fixation of right distal displaced fibular fracture using Biomet plate with  closed intramedullary nailing of displaced distal tibia fracture using Biomet VersaNail.  Echo IMPRESSIONS    1. The left ventricle has normal systolic function with an ejection fraction of 60-65%. The cavity size was normal. There is mild asymmetric left ventricular hypertrophy. Left ventricular diastolic Doppler parameters are consistent with impaired  relaxation.  2. The right ventricle has normal systolic function. The cavity was normal. There is no increase in right ventricular wall thickness.  3. Mild aortic annular calcification noted.  Antimicrobials:  Anti-infectives (From admission, onward)   Start     Dose/Rate Route Frequency Ordered Stop   02/01/19 1730  ceFAZolin (ANCEF) IVPB 2g/100 mL premix     2 g 200 mL/hr over 30 Minutes Intravenous Every 6 hours 02/01/19 1526 02/02/19 0542   02/01/19 0600  ceFAZolin (ANCEF) IVPB 2g/100 mL premix     2 g 200 mL/hr over 30 Minutes Intravenous On call to O.R. 02/01/19 0348 02/01/19 1157     Subjective: No complaints today.  Objective: Vitals:   02/12/19 2147 02/13/19 0509 02/13/19 1400 02/13/19 2311  BP: (!) 135/96 (!) 154/96 136/77 136/89  Pulse: 84 66 75 75  Resp: 16 16 16 16   Temp: 99.6 F (37.6 C) 97.9 F (36.6 C) 98 F (36.7 C) 98.3 F (36.8 C)  TempSrc: Oral Oral Oral Oral  SpO2: 97% 97% 98% 98%  Weight:      Height:        Intake/Output Summary (Last 24 hours) at 02/14/2019 1351 Last data filed at 02/14/2019 0544 Gross per 24 hour  Intake 960 ml  Output 1450 ml  Net -490 ml   Filed Weights   02/01/19 0900  Weight: 82.6 kg  Examination: General exam: Calm, comfortable, not in acute distress, older for age, average built.  HEENT:Oral mucosa moist, Ear/Nose WNL grossly, dentition normal. Respiratory system: Bilateral equal air entry, no crackles and wheezing, no use of accessory muscle, non tender on palpation. Cardiovascular system: regular rate and rhythm, S1 & S2 heard, No  JVD/murmurs. Gastrointestinal system: Abdomen soft, non-tender, non-distended, BS +. No hepatosplenomegaly palpable. Nervous System:Alert, awake and oriented at baseline. Able to move UE and LE, sensation intact. Extremities: Rt Lower extremity with a splint/dressing intact, distal toes visible and pinkish with intact sensation.   Skin: No rashes,no icterus. MSK: Normal muscle bulk,tone, power  Data Reviewed: I have personally reviewed following labs and imaging studies  CBC: Recent Labs  Lab 02/10/19 0547 02/14/19 0550  WBC 7.8 6.9  HGB 12.6* 12.7*  HCT 38.0* 39.3  MCV 93.6 93.3  PLT 362 458*   Basic Metabolic Panel: Recent Labs  Lab 02/08/19 0555 02/10/19 0547 02/14/19 0550  NA  --  132* 136  K  --  3.4* 4.2  CL  --  96* 98  CO2  --  26 27  GLUCOSE  --  113* 98  BUN  --  16 16  CREATININE 0.98 0.98 0.95  CALCIUM  --  8.8* 9.3  MG  --  2.2  --    GFR: Estimated Creatinine Clearance: 87.8 mL/min (by C-G formula based on SCr of 0.95 mg/dL). Liver Function Tests: Recent Labs  Lab 02/10/19 0547  AST 31  ALT 38  ALKPHOS 60  BILITOT 0.6  PROT 7.7  ALBUMIN 3.6   No results for input(s): LIPASE, AMYLASE in the last 168 hours. No results for input(s): AMMONIA in the last 168 hours. Coagulation Profile: No results for input(s): INR, PROTIME in the last 168 hours. Cardiac Enzymes: No results for input(s): CKTOTAL, CKMB, CKMBINDEX, TROPONINI in the last 168 hours. BNP (last 3 results) No results for input(s): PROBNP in the last 8760 hours. HbA1C: No results for input(s): HGBA1C in the last 72 hours. CBG: No results for input(s): GLUCAP in the last 168 hours. Lipid Profile: No results for input(s): CHOL, HDL, LDLCALC, TRIG, CHOLHDL, LDLDIRECT in the last 72 hours. Thyroid Function Tests: No results for input(s): TSH, T4TOTAL, FREET4, T3FREE, THYROIDAB in the last 72 hours. Anemia Panel: No results for input(s): VITAMINB12, FOLATE, FERRITIN, TIBC, IRON, RETICCTPCT  in the last 72 hours. Sepsis Labs: No results for input(s): PROCALCITON, LATICACIDVEN in the last 168 hours.  Recent Results (from the past 240 hour(s))  Culture, blood (routine x 2)     Status: None   Collection Time: 02/04/19  4:36 PM   Specimen: BLOOD  Result Value Ref Range Status   Specimen Description   Final    BLOOD LEFT ANTECUBITAL Performed at [facility], [address] [place], [place] [address]    Special Requests   Final    BOTTLES DRAWN AEROBIC ONLY Blood Culture adequate volume Performed at [facility], [address] [place], [place] [address]    Culture   Final    NO GROWTH 5 DAYS Performed at [facility], [address] [place], [place] [address]    Report Status 02/09/2019 FINAL  Final  Culture, blood (routine x 2)     Status: None   Collection Time: 02/04/19  4:37 PM   Specimen: BLOOD  Result Value Ref Range Status   Specimen Description   Final    BLOOD LEFT ANTECUBITAL Performed  at [facility], [address] [place], [place] [address]    Special Requests   Final    BOTTLES DRAWN AEROBIC ONLY Blood Culture adequate volume Performed at [facility], [address] [place], [place] [address]    Culture   Final    NO GROWTH 5 DAYS Performed at [facility], [address] [place], [place] [address]    Report Status 02/09/2019 FINAL  Final  Novel Coronavirus, NAA (hospital order; send-out to ref lab)     Status: None   Collection Time: 02/09/19 10:00 AM   Specimen: Nasopharyngeal Swab; Respiratory  Result Value Ref Range Status   SARS-CoV-2, NAA NOT DETECTED NOT DETECTED Final    Comment: (NOTE) Testing was performed using the cobas(R) SARS-CoV-2 test. This test was developed and its performance characteristics determined by [facility]. This test has not been FDA cleared or approved. This test has been authorized by FDA under an Emergency Use  Authorization (EUA). This test is only authorized for the duration of time the declaration that circumstances exist justifying the authorization of the emergency use of in vitro diagnostic tests for detection of SARS-CoV-2 virus and/or diagnosis of COVID-19 infection under section 564(b)(1) of the Act, 21 U.S.C. 469GEX-5(M)(8360bbb-3(b)(1), unless the authorization is terminated or revoked sooner. When diagnostic testing is negative, the possibility of a false negative result should be considered in the context of a patient's recent exposures and the presence of clinical signs and symptoms consistent with COVID-19. An individual without symptoms of COVID-19 and who is not shedding SARS-CoV-2 virus would expect to have  a negative (not detected) result in this assay. Performed At: [facility] [address] [place], [place] [id number] Jolene SchimkeNagendra Sanjai MD UV:[id number]:[phone]    Coronavirus Source NASOPHARYNGEAL  Final    Comment: Performed at [facility], [address] [place], [place] [address]         Radiology Studies: No results found.      Scheduled Meds:  amLODipine  5 mg Oral Daily   docusate sodium  100 mg Oral BID   enoxaparin (LOVENOX) injection  40 mg Subcutaneous Q24H   folic acid  1 mg Oral Daily   multivitamin with minerals  1 tablet Oral Daily   nicotine  21 mg Transdermal Q24H   pantoprazole  40 mg Oral BID   polyethylene glycol  17 g Oral BID   senna  1 tablet Oral BID   sucralfate  1 g Oral TID WC & HS   thiamine  100 mg Oral Daily   Or   thiamine  100 mg Intravenous Daily   Continuous Infusions:  methocarbamol (ROBAXIN) IV 500 mg (01/30/19 2332)     LOS: 15 days    Time spent: over 30 min    Lanae Boastamesh Ho Parisi, MD [facility] Pager AMION  If 7PM-7AM, please contact night-coverage [email or web address] Password [email or web address] 02/14/2019, 1:51 PM

## 2019-02-15 LAB — CREATININE, SERUM
Creatinine, Ser: 1.06 mg/dL (ref 0.61–1.24)
GFR calc Af Amer: >60 mL/min (ref >60)
GFR calc non Af Amer: >60 mL/min (ref >60)

## 2019-02-15 MED ORDER — OXYCODONE HCL 5 MG PO TABS
5.0000 mg | ORAL_TABLET | ORAL | Status: DC | PRN
  Administered 2019-02-15 – 2019-02-16 (×4): 5 mg via ORAL
  Filled 2019-02-15 (×4): qty 1

## 2019-02-15 NOTE — Progress Notes (Signed)
PROGRESS NOTE    Jerry Humphrey  ZOX:[id number] DOB: 14-Apr-1961 DOA: 01/30/2019 PCP: Patient, [name]   Brief Narrative:  58 year old male with hypertension, tobacco, alcohol, and question of remote seizures presented after a fall with an acute right tib-fib fracture. Patient is doing well postop.  He will need skilled nursing facility placement. He is awaiting for placement, and having difficulty due to lack of payer source.  Subjective Sleeping this morning reports not getting good sleep.  Does not want to be disturbed. No complaints. Assessment & Plan:  Acute right tib-fib fracture secondary to injury needing operative intervention 02/01/2019.  Post op doing well pain is controlled on current Tylenol, opiates and on Lovenox for DVT prophylaxis.  Continue nonweightbearing on right lower extremity PT OT.  Awaiting for skilled nursing facility placement. Splint in place and will be changed on Friday  History of alcoholism- no evidence of withdrawal symptoms. Atypical chest pain: Resolved no recurrence.  Likely from GERD.  Troponin was negative x3.  2D echocardiogram w normal Reactive leukocytosis, postop and resolved. Tobacco/alcohol use- has been extensively counseled.  Remote history of seizure - pt not taking antiepileptics.  I wonder if he had alcohol withdrawal seizure in the past? He is not sure. Fever: x1 on 7/15. blood cultures (ngtd x 5) and urine (UA negative).  Continue incentive spirometry. Hypokalemia Repleted  DVT prophylaxis: lovenox Code Status: full  Family Communication: Discussed with the patient in detail.   Disposition Plan: Pending SNF placement.  He is in difficulty place list.  Per CM " [facility] for LOG. Expected Discharge Plan: Skilled Nursing Facility. Barriers to Discharge: SNF Pending payor source - LOG(Awaiting LOG approval)"  Consultants:   orthopedics  Procedures:  7/12  Open reduction internal fixation of  right distal displaced fibular fracture using Biomet plate with closed intramedullary nailing of displaced distal tibia fracture using Biomet VersaNail.  Echo IMPRESSIONS    1. The left ventricle has normal systolic function with an ejection fraction of 60-65%. The cavity size was normal. There is mild asymmetric left ventricular hypertrophy. Left ventricular diastolic Doppler parameters are consistent with impaired  relaxation.  2. The right ventricle has normal systolic function. The cavity was normal. There is no increase in right ventricular wall thickness.  3. Mild aortic annular calcification noted.  Antimicrobials:  Anti-infectives (From admission, onward)   Start     Dose/Rate Route Frequency Ordered Stop   02/01/19 1730  ceFAZolin (ANCEF) IVPB 2g/100 mL premix     2 g 200 mL/hr over 30 Minutes Intravenous Every 6 hours 02/01/19 1526 02/02/19 0542   02/01/19 0600  ceFAZolin (ANCEF) IVPB 2g/100 mL premix     2 g 200 mL/hr over 30 Minutes Intravenous On call to O.R. 02/01/19 0348 02/01/19 1157     Subjective: No complaints today.  Objective: Vitals:   02/13/19 2311 02/14/19 1427 02/14/19 2104 02/15/19 0457  BP: 136/89 134/89 139/89 136/89  Pulse: 75 76 71 66  Resp: 16 20 16 18   Temp: 98.3 F (36.8 C) 98.2 F (36.8 C) 98 F (36.7 C) 97.9 F (36.6 C)  TempSrc: Oral  Oral   SpO2: 98% 97% 100% 97%  Weight:      Height:        Intake/Output Summary (Last 24 hours) at 02/15/2019 1049 Last data filed at 02/15/2019 0600 Gross per 24 hour  Intake 720 ml  Output 1000 ml  Net -280 ml   Filed  Weights   02/01/19 0900  Weight: 82.6 kg    Examination: General exam: Calm, comfortable, not in acute distress, older for age, average built.  HEENT:Oral mucosa moist, Ear/Nose WNL grossly, dentition normal. Respiratory system: Bilateral equal air entry, no crackles and wheezing, no use of accessory muscle, non tender on palpation. Cardiovascular system: regular rate and  rhythm, S1 & S2 heard, No JVD/murmurs. Gastrointestinal system: Abdomen soft, non-tender, non-distended, BS +. No hepatosplenomegaly palpable. Nervous System:Alert, awake and oriented at baseline. Able to move UE and LE, sensation intact. Extremities: Right lower extremity with a splint and dressing intact, distal toes visible pinkish.   Skin: No rashes,no icterus. MSK: Normal muscle bulk,tone, power  Data Reviewed: I have personally reviewed following labs and imaging studies  CBC: Recent Labs  Lab 02/10/19 0547 02/14/19 0550  WBC 7.8 6.9  HGB 12.6* 12.7*  HCT 38.0* 39.3  MCV 93.6 93.3  PLT 362 426*   Basic Metabolic Panel: Recent Labs  Lab 02/10/19 0547 02/14/19 0550 02/15/19 0555  NA 132* 136  --   K 3.4* 4.2  --   CL 96* 98  --   CO2 26 27  --   GLUCOSE 113* 98  --   BUN 16 16  --   CREATININE 0.98 0.95 1.06  CALCIUM 8.8* 9.3  --   MG 2.2  --   --    GFR: Estimated Creatinine Clearance: 78.6 mL/min (by C-G formula based on SCr of 1.06 mg/dL). Liver Function Tests: Recent Labs  Lab 02/10/19 0547  AST 31  ALT 38  ALKPHOS 60  BILITOT 0.6  PROT 7.7  ALBUMIN 3.6   No results for input(s): LIPASE, AMYLASE in the last 168 hours. No results for input(s): AMMONIA in the last 168 hours. Coagulation Profile: No results for input(s): INR, PROTIME in the last 168 hours. Cardiac Enzymes: No results for input(s): CKTOTAL, CKMB, CKMBINDEX, TROPONINI in the last 168 hours. BNP (last 3 results) No results for input(s): PROBNP in the last 8760 hours. HbA1C: No results for input(s): HGBA1C in the last 72 hours. CBG: No results for input(s): GLUCAP in the last 168 hours. Lipid Profile: No results for input(s): CHOL, HDL, LDLCALC, TRIG, CHOLHDL, LDLDIRECT in the last 72 hours. Thyroid Function Tests: No results for input(s): TSH, T4TOTAL, FREET4, T3FREE, THYROIDAB in the last 72 hours. Anemia Panel: No results for input(s): VITAMINB12, FOLATE, FERRITIN, TIBC, IRON,  RETICCTPCT in the last 72 hours. Sepsis Labs: No results for input(s): PROCALCITON, LATICACIDVEN in the last 168 hours.  Recent Results (from the past 240 hour(s))  Novel Coronavirus, NAA (hospital order; send-out to ref lab)     Status: None   Collection Time: 02/09/19 10:00 AM   Specimen: Nasopharyngeal Swab; Respiratory  Result Value Ref Range Status   SARS-CoV-2, NAA NOT DETECTED NOT DETECTED Final    Comment: (NOTE) Testing was performed using the cobas(R) SARS-CoV-2 test. This test was developed and its performance characteristics determined by [employer]. This test has not been FDA cleared or approved. This test has been authorized by FDA under an Emergency Use Authorization (EUA). This test is only authorized for the duration of time the declaration that circumstances exist justifying the authorization of the emergency use of in vitro diagnostic tests for detection of SARS-CoV-2 virus and/or diagnosis of COVID-19 infection under section 564(b)(1) of the Act, 21 U.S.C. 834HDQ-2(I)(2), unless the authorization is terminated or revoked sooner. When diagnostic testing is negative, the possibility of a false negative result should  be considered in the context of a patient's recent exposures and the presence of clinical signs and symptoms consistent with COVID-19. An individual without symptoms of COVID-19 and who is not shedding SARS-CoV-2 virus would expect to have  a negative (not detected) result in this assay. Performed At: [facility] [address] [place], [place] [id number] Jolene SchimkeNagendra Sanjai MD HQ:[id number]:[phone]    Coronavirus Source NASOPHARYNGEAL  Final    Comment: [facility]formed at [facility], [address] [place], [place] [address]         Radiology Studies: No results found.      Scheduled Meds: . amLODipine  5 mg Oral Daily  . docusate sodium  100 mg Oral BID  . enoxaparin (LOVENOX) injection  40 mg Subcutaneous Q24H   . folic acid  1 mg Oral Daily  . multivitamin with minerals  1 tablet Oral Daily  . nicotine  21 mg Transdermal Q24H  . pantoprazole  40 mg Oral BID  . polyethylene glycol  17 g Oral BID  . senna  1 tablet Oral BID  . sucralfate  1 g Oral TID WC & HS  . thiamine  100 mg Oral Daily   Or  . thiamine  100 mg Intravenous Daily   Continuous Infusions: . methocarbamol (ROBAXIN) IV 500 mg (01/30/19 2332)     LOS: 16 days    Time spent: over 30 min    Lanae Boastamesh Thu Baggett, MD [facility] Pager AMION  If 7PM-7AM, please contact night-coverage [email or web address] Password [email or web address] 02/15/2019, 10:49 AM

## 2019-02-15 NOTE — Plan of Care (Signed)
  Problem: Activity: Goal: Risk for activity intolerance will decrease Outcome: Progressing   Problem: Nutrition: Goal: Adequate nutrition will be maintained Outcome: Progressing   Problem: Elimination: Goal: Will not experience complications related to bowel motility Outcome: Progressing Goal: Will not experience complications related to urinary retention Outcome: Progressing   Problem: Pain Managment: Goal: General experience of comfort will improve Outcome: Progressing   

## 2019-02-16 MED ORDER — AMLODIPINE BESYLATE 5 MG PO TABS: 5.0000 mg | ORAL_TABLET | Freq: Every day | ORAL | 0 refills | Status: DC

## 2019-02-16 MED ORDER — THIAMINE HCL 100 MG PO TABS: 100.0000 mg | ORAL_TABLET | Freq: Every day | ORAL | 0 refills | Status: AC

## 2019-02-16 MED ORDER — PANTOPRAZOLE SODIUM 40 MG PO TBEC: 40.0000 mg | DELAYED_RELEASE_TABLET | Freq: Every day | ORAL | 0 refills | Status: DC

## 2019-02-16 MED ORDER — METHOCARBAMOL 500 MG PO TABS: 500.0000 mg | ORAL_TABLET | Freq: Three times a day (TID) | ORAL | 0 refills | Status: DC | PRN

## 2019-02-16 MED FILL — METHOCARBAMOL 500 MG TABS: 500 | 10 days supply | Qty: 30 | Fill #0

## 2019-02-16 MED FILL — AMLODIPINE BESYLATE 5 MG TA: 5 | 30 days supply | Qty: 30 | Fill #0

## 2019-02-16 MED FILL — PANTOPRAZOLE SOD DR 40 MG T: 40 | 30 days supply | Qty: 30 | Fill #0

## 2019-02-16 NOTE — TOC Progression Note (Signed)
Transition of Care [facility]) - Progression Note    Patient Details  Name: Jerry Humphrey MRN: [id number] Date of Birth: 28-Jun-1961  Transition [facility]) CM/SW Contact  Flora Parks, Juliann Pulse, RN Phone Number: 02/16/2019, 11:50 AM  Clinical Narrative:  TC brother about d/c today-Keith Mcfann [phone]-he said to call him back around 11:30am-CM informed him that patient may d/c today-now his mailbox is full-will try again later. Patient will d/c home to stay with brother see address in prior note-[facility] rep [facility] aware of d/c. Awaiting ortho clearance-they will order any additional dme needed. pcp appt set @ [facility] Patient [place]. Sent email to [facility] pharmacy for 1 time fill of meds-Dr. Will send them to [facility] pharmacy. [employer] will deliver dme to rm prior d/c.      Expected Discharge Plan: Skilled Nursing Facility Barriers to Discharge: Other (comment)(patient will stay with his brother [name] who can accept him either Monday or Tuesday)  Expected Discharge Plan and Services Expected Discharge Plan: [place]   Discharge Planning Services: CM Consult   Living arrangements for the past 2 months: Apartment Expected Discharge Date: 02/16/19                           [facility] Agency: [place] Date [facility] Agency Contacted: 02/13/19 Time [place]: 1252 Representative spoke with at [place]: [place] ([place]) Interventions    Readmission Risk Interventions No flowsheet data found.

## 2019-02-16 NOTE — Discharge Summary (Signed)
Physician Discharge Summary  Jerry Humphrey WUJ:[id number] DOB: 10-20-1960 DOA: 01/30/2019  PCP: Patient, [name]  Admit date: 01/30/2019 Discharge date: 02/16/2019  Admitted From: HOME Disposition:  HOME  Recommendations for Outpatient Follow-up:  1. Follow up with PCP in 1-2 weeks 2. Please obtain BMP/CBC in one week 3. Please follow up on the following pending results:  Home Health: yes  Equipment/Devices: WC, Walker, 3 in 1  Discharge Condition: Stable CODE STATUS: FULL Diet recommendation: Heart Healthy  Brief/Interim Summary: 58 year old male with hypertension, tobacco, alcohol, and question of remote seizures presented after a fall with an acute right tib-fib fracture. Patient is doing well postop.  He will need skilled nursing facility placement. He is awaiting for placement, and having difficulty due to lack of payer source. Patient has not improved, seen by PT OT and has recommended home health PT with wheelchair rolling walker 3 and 1 which will be set up.  His medication will be filled by our pharmacy case manager. Discussed with orthopedic team Jerry Humphrey will be changing his dressing prior to discharge today and will follow-up in the office as outpatient.  Assessment & Plan:  Discharge Diagnoses:  Active Problems:   Alcohol abuse   Chest pain   Leukocytosis   Tobacco abuse   Closed fracture of right tibia and fibula   Fall due to stumbling   Seizure disorder (HCC)   Tibia/fibula fracture   Closed fracture of right distal tibia  Acute right tib-fib fracture secondary to injury needing ORIF 7/12/2020Post op doing well pain is controlled. WAS ON Lovenox for DVT prophylaxis Continue nonweightbearing on  RLE as per ortho. Was waiting on SNF skilled nursing facility placement-but no PR service unable to go there.  At this time seen by PT OT and social worker.  Patient continued on PT OT-at this time PT has suggested wheelchair 3 and 1 rolling walker and discharge  home with home health, continue supervision for mobility/OOB.    History of alcoholism:no evidence of withdrawal symptoms. Cont thiamine,folate. Atypical chest pain:Resolved no recurrence.Likely from GERD- cont ppi.Troponin was negative x3.2D echocardiogram w normal. Reactive leukocytosis, postop and resolved. Tobacco/alcohol use:has been extensively counseled.  Remote history of seizure: pt not taking antiepileptics. I wonder if he had alcohol withdrawal seizure in the past? He is not sure. Fever: x1 on 7/15. blood cultures (ngtd x 5) and urine (UA negative).  Continue incentive spirometry. Hypokalemia resovled. Hypertension patient has been initiated on amlodipine and blood pressure doing well on it for few days nd will continue the same dose.  DVT prophylaxis: lovenox here. Code Status: full  Family Communication: Discussed with the patient.   Disposition Plan: HHPT Discussed with the orthopedic team and okay for discharge today.  Consultants:   orthopedics  Procedures:  7/12  Open reduction internal fixation of right distal displaced fibular fracture using Biomet plate with closed intramedullary nailing of displaced distal tibia fracture using Biomet VersaNail.  Echo IMPRESSIONS   1. The left ventricle has normal systolic function with an ejection fraction of 60-65%. The cavity size was normal. There is mild asymmetric left ventricular hypertrophy. Left ventricular diastolic Doppler parameters are consistent with impaired  relaxation. 2. The right ventricle has normal systolic function. The cavity was normal. There is no increase in right ventricular wall thickness. 3. Mild aortic annular calcification noted.    Discharge Instructions  Discharge Instructions    Diet - low sodium heart healthy   Complete by: As directed    Discharge  instructions   Complete by: As directed    Please call call Jerry Humphrey or return to ER for similar or worsening recurring problem that  brought you to hospital or if any fever,nausea/vomiting,abdominal pain, uncontrolled pain, chest pain,  shortness of breath or any other alarming symptoms.  Please follow-up your orthopedic discharge Dr Jerry Humphrey, for rolling walker/cam walker and for care of your leg,  . doctor as instructed in a week time and call the office for appointment.  Please avoid alcohol, smoking, or any other illicit substance and maintain healthy habits including taking your regular medications as prescribed.  You were cared for by a hospitalist during your hospital stay. If you have any questions about your discharge medications or the care you received while you were in the hospital after you are discharged, you can call the unit and ask to speak with the hospitalist on call if the hospitalist that took care of you is not available.  Once you are discharged, your primary care physician will handle any further medical issues. Please note that NO REFILLS for any discharge medications will be authorized once you are discharged, as it is imperative that you return to your primary care physician (or establish a relationship with a primary care physician if you do not have one) for your aftercare needs so that they can reassess your need for medications and monitor your lab values   Increase activity slowly   Complete by: As directed      Allergies as of 02/16/2019      Reactions   Codeine Rash      Medication List    STOP taking these medications   HYDROcodone-acetaminophen 5-325 MG tablet Commonly known as: NORCO/VICODIN     TAKE these medications   amLODipine 5 MG tablet Commonly known as: NORVASC Take 1 tablet (5 mg total) by mouth daily. Start taking on: February 17, 2019   methocarbamol 500 MG tablet Commonly known as: Robaxin Take 1 tablet (500 mg total) by mouth 3 (three) times daily as needed for up to 30 doses.   oxyCODONE-acetaminophen 5-325 MG tablet Commonly known as: PERCOCET/ROXICET Take 1-2  tablets by mouth every 6 (six) hours as needed for severe pain. What changed: how much to take   pantoprazole 40 MG tablet Commonly known as: PROTONIX Take 1 tablet (40 mg total) by mouth daily.   thiamine 100 MG tablet Take 1 tablet (100 mg total) by mouth daily. Start taking on: February 17, 2019            Durable Medical Equipment  (From admission, onward)         Start     Ordered   02/16/19 1325  For home use only DME standard manual wheelchair with seat cushion  Once    Comments: Patient suffers from tibia-fibula fracture s/p ORIF which impairs their ability to perform daily activities like bathing, dressing and feeding in the home.  A walker will not resolve issue with performing activities of daily living. A wheelchair will allow patient to safely perform daily activities. Patient can safely propel the wheelchair in the home or has a caregiver who can provide assistance. Length of need 6 months . Accessories: elevating leg rests (ELRs), wheel locks, extensions and anti-tippers.   02/16/19 1325   02/16/19 1119  DME 3-in-1  Once     02/16/19 1121   02/16/19 1119  For home use only DME Walker rolling  [facility])  Once    Question:  Patient needs  a walker to treat with the following condition  Answer:  Tibia/fibula fracture   02/16/19 1121   02/16/19 1119  For home use only DME wheelchair cushion (seat and back)  (Wheelchairs)  Once     02/16/19 1121         Follow-up Information    [place] [place], Jerry Humphrey. Call in 2 weeks.   Specialty: Orthopedic Surgery Why: [phone] Contact information: [address] [place] [address] [place] [address] [phone]        [facility]. Go on 03/03/2019.   Specialty: Internal Medicine Why: 9:20a-bring photo id, d/c paperwork Contact information: [place] [phone]       [facility]. Go to.   Why: go directly @ d/c to pharmacy for a one time  fill of medicines. Contact information: [place], [facility] Follow up.   Specialty: [place] Why: [facility] physical therapy/occupational therapy Contact information: [place] [address] [phone]        [facility] Follow up.   Why: wheelchair,bedside commode,roling walker Contact information: [place] [address] [phone]          Allergies  Allergen Reactions  . Codeine Rash   Procedures/Studies: Dg Tibia/fibula Right  Result Date: 02/01/2019 CLINICAL DATA:  Distal tibia and fibular fractures.  ORIF. EXAM: RIGHT TIBIA AND FIBULA - 2 VIEW; DG C-ARM 1-60 MIN-NO REPORT COMPARISON:  Left ankle radiograph 01/30/2019 FINDINGS: Posterolateral plate and screw fixation is present the distal fibula. The fracture is reduced. Intramedullary rod is placed in the femur. There are 2 distal and 1 proximal interlocking screws. There is near anatomic reduction with minimal residual medial displacement. IMPRESSION: 1. Near anatomic reduction of distal tibia and fibular fractures. No radiographic evidence for complication. Electronically Signed   By: San Morelle M.D.   On: 02/01/2019 14:58   Dg Ankle Complete Right  Result Date: 01/30/2019 CLINICAL DATA:  Pain after fall EXAM: RIGHT ANKLE - COMPLETE 3+ VIEW COMPARISON:  None. FINDINGS: A fracture of the distal fibula demonstrates only mild displacement. Moderate displacement is seen in the distal tibial diaphysis fracture. The ankle mortise is intact. No other acute abnormalities identified. 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 IMPRESSION: 1. Fractures of the distal fibula and tibia as described above. 2. 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 Electronically Signed   By: Dorise Bullion III M.D   On: 01/30/2019 14:33   Ct Head Wo Contrast  Result Date: 01/18/2019 CLINICAL DATA:  Blunt maxillofacial  trauma. EXAM: CT HEAD WITHOUT CONTRAST CT MAXILLOFACIAL WITHOUT CONTRAST TECHNIQUE: Multidetector CT imaging of the head and maxillofacial structures were performed using the standard protocol without intravenous contrast. Multiplanar CT image reconstructions of the maxillofacial structures were also generated. COMPARISON:  03/07/2016 head CT FINDINGS: CT HEAD FINDINGS Brain: No evidence of acute infarction, hemorrhage, hydrocephalus, extra-axial collection or mass lesion/mass effect. Cerebral volume loss and chronic small vessel ischemic change in the white matter, greater than expected for age Vascular: Atherosclerotic calcification Skull: Negative for calvarial fracture. Facial fractures described below CT MAXILLOFACIAL FINDINGS Osseous: Incomplete tripod fracture on the right. There is segmental fracturing of the right zygoma extending into the lateral orbit and superior orbit. The right zygoma is depressed compared to the left, with buckling of the lateral orbital wall which impinges on the lateral  rectus. Remote nasal bone and left maxillary fractures. Orbits: Mild right proptosis with primarily preseptal stranding. Mild right lateral rectus impingement as above. Sinuses: No hemosinus. Soft tissues: Hematoma on the right. IMPRESSION: Head CT: 1. No evidence of intracranial injury. 2. Atrophy and chronic small vessel ischemia. Maxillofacial CT: 1. Incomplete tripod fracture on the right with zygoma fracturing and superolateral orbit fracturing. There is depression of the right zygoma with buckling of the lateral orbital wall which impinges on the lateral rectus. 2. Right orbital hematoma, mainly preseptal, with mild proptosis. Electronically Signed   By: Marnee SpringJonathon  Watts M.D.   On: 01/18/2019 07:55   Dg Chest Port 1 View  Result Date: 02/04/2019 CLINICAL DATA:  Sudden onset of fever.  Recent surgery. EXAM: PORTABLE CHEST 1 VIEW COMPARISON:  01/30/2019 FINDINGS: Cardiac silhouette is normal in size. Normal  mediastinal and hilar contours. Clear lungs.  No pleural effusion or pneumothorax. Skeletal structures are unremarkable. IMPRESSION: No active disease. Electronically Signed   By: Amie Portlandavid  Ormond M.D.   On: 02/04/2019 20:18   Dg Chest Port 1 View  Result Date: 01/30/2019 CLINICAL DATA:  Fall, hypertension. EXAM: PORTABLE CHEST 1 VIEW COMPARISON:  Radiograph of January 26, 2016. FINDINGS: The heart size and mediastinal contours are within normal limits. Both lungs are clear. No pneumothorax or pleural effusion is noted. The visualized skeletal structures are unremarkable. IMPRESSION: No active disease. Electronically Signed   By: Lupita RaiderJames  Green Jr M.D.   On: 01/30/2019 16:35   Dg C-arm 1-60 Min-no Report  Result Date: 02/01/2019 CLINICAL DATA:  Distal tibia and fibular fractures.  ORIF. EXAM: RIGHT TIBIA AND FIBULA - 2 VIEW; DG C-ARM 1-60 MIN-NO REPORT COMPARISON:  Left ankle radiograph 01/30/2019 FINDINGS: Posterolateral plate and screw fixation is present the distal fibula. The fracture is reduced. Intramedullary rod is placed in the femur. There are 2 distal and 1 proximal interlocking screws. There is near anatomic reduction with minimal residual medial displacement. IMPRESSION: 1. Near anatomic reduction of distal tibia and fibular fractures. No radiographic evidence for complication. Electronically Signed   By: Marin Robertshristopher  Mattern M.D.   On: 02/01/2019 14:58   Ct Maxillofacial Wo Contrast  Result Date: 01/18/2019 CLINICAL DATA:  Blunt maxillofacial trauma. EXAM: CT HEAD WITHOUT CONTRAST CT MAXILLOFACIAL WITHOUT CONTRAST TECHNIQUE: Multidetector CT imaging of the head and maxillofacial structures were performed using the standard protocol without intravenous contrast. Multiplanar CT image reconstructions of the maxillofacial structures were also generated. COMPARISON:  03/07/2016 head CT FINDINGS: CT HEAD FINDINGS Brain: No evidence of acute infarction, hemorrhage, hydrocephalus, extra-axial collection or  mass lesion/mass effect. Cerebral volume loss and chronic small vessel ischemic change in the white matter, greater than expected for age Vascular: Atherosclerotic calcification Skull: Negative for calvarial fracture. Facial fractures described below CT MAXILLOFACIAL FINDINGS Osseous: Incomplete tripod fracture on the right. There is segmental fracturing of the right zygoma extending into the lateral orbit and superior orbit. The right zygoma is depressed compared to the left, with buckling of the lateral orbital wall which impinges on the lateral rectus. Remote nasal bone and left maxillary fractures. Orbits: Mild right proptosis with primarily preseptal stranding. Mild right lateral rectus impingement as above. Sinuses: No hemosinus. Soft tissues: Hematoma on the right. IMPRESSION: Head CT: 1. No evidence of intracranial injury. 2. Atrophy and chronic small vessel ischemia. Maxillofacial CT: 1. Incomplete tripod fracture on the right with zygoma fracturing and superolateral orbit fracturing. There is depression of the right zygoma with buckling of the lateral orbital wall which  impinges on the lateral rectus. 2. Right orbital hematoma, mainly preseptal, with mild proptosis. Electronically Signed   By: Marnee Spring M.D.   On: 01/18/2019 07:55   Subjective: Pain controlled. No new complaints. Going home.  Discharge Exam: Vitals:   02/15/19 2034 02/16/19 0617  BP: 127/85 135/87  Pulse: 63 72  Resp: 18 20  Temp: 98.1 F (36.7 C) 98.1 F (36.7 C)  SpO2: 99% 97%   Vitals:   02/15/19 0457 02/15/19 1300 02/15/19 2034 02/16/19 0617  BP: 136/89 129/79 127/85 135/87  Pulse: 66 75 63 72  Resp: Temp: 97.9 F (36.6 C) 98.3 F (36.8 C) 98.1 F (36.7 C) 98.1 F (36.7 C)  TempSrc:  Oral Oral Oral  SpO2: 97% 99% 99% 97%  Weight:      Height:        General: Pt is alert, awake, not in acute distress Cardiovascular: RRR, S1/S2 +, no rubs, no gallops Respiratory: CTA bilaterally, no  wheezing, no rhonchi Abdominal: Soft, NT, ND, bowel sounds + Extremities: no edema, no cyanosis   The results of significant diagnostics from this hospitalization (including imaging, microbiology, ancillary and laboratory) are listed below for reference.     Microbiology: Recent Results (from the past 240 hour(s))  Novel Coronavirus, NAA (hospital order; send-out to ref lab)     Status: None   Collection Time: 02/09/19 10:00 AM   Specimen: Nasopharyngeal Swab; Respiratory  Result Value Ref Range Status   SARS-CoV-2, NAA NOT DETECTED NOT DETECTED Final    Comment: (NOTE) Testing was performed using the cobas(R) SARS-CoV-2 test. This test was developed and its performance characteristics determined by [employer] [facility]. This test has not been FDA cleared or approved. This test has been authorized by FDA under an Emergency Use Authorization (EUA). This test is only authorized for the duration of time the declaration that circumstances exist justifying the authorization of the emergency use of in vitro diagnostic tests for detection of SARS-CoV-2 virus and/or diagnosis of COVID-19 infection under section 564(b)(1) of the Act, 21 U.S.C. 409WJX-9(J)(4), unless the authorization is terminated or revoked sooner. When diagnostic testing is negative, the possibility of a false negative result should be considered in the context of a patient's recent exposures and the presence of clinical signs and symptoms consistent with COVID-19. An individual without symptoms of COVID-19 and who is not shedding SARS-CoV-2 virus would expect to have  a negative (not detected) result in this assay. Performed At: [facility] [address] [place], [place] [id number] Jolene Schimke Jerry Humphrey YQ:[phone]    Coronavirus Source NASOPHARYNGEAL  Final    Comment: Performed at [facility], [address] [place], [place] [address]     Labs: BNP (last 3 results) No results for  input(s): BNP in the last 8760 hours. Basic Metabolic Panel: Recent Labs  Lab 02/10/19 0547 02/14/19 0550 02/15/19 0555  NA 132* 136  --   K 3.4* 4.2  --   CL 96* 98  --   CO2 26 27  --   GLUCOSE 113* 98  --   BUN 16 16  --   CREATININE 0.98 0.95 1.06  CALCIUM 8.8* 9.3  --   MG 2.2  --   --    Liver Function Tests: Recent Labs  Lab 02/10/19 0547  AST 31  ALT 38  ALKPHOS 60  BILITOT 0.6  PROT 7.7  ALBUMIN 3.6   No results for input(s): LIPASE, AMYLASE in the  last 168 hours. No results for input(s): AMMONIA in the last 168 hours. CBC: Recent Labs  Lab 02/10/19 0547 02/14/19 0550  WBC 7.8 6.9  HGB 12.6* 12.7*  HCT 38.0* 39.3  MCV 93.6 93.3  PLT 362 458*   Cardiac Enzymes: No results for input(s): CKTOTAL, CKMB, CKMBINDEX, TROPONINI in the last 168 hours. BNP: Invalid input(s): POCBNP CBG: No results for input(s): GLUCAP in the last 168 hours. D-Dimer No results for input(s): DDIMER in the last 72 hours. Hgb A1c No results for input(s): HGBA1C in the last 72 hours. Lipid Profile No results for input(s): CHOL, HDL, LDLCALC, TRIG, CHOLHDL, LDLDIRECT in the last 72 hours. Thyroid function studies No results for input(s): TSH, T4TOTAL, T3FREE, THYROIDAB in the last 72 hours.  Invalid input(s): FREET3 Anemia work up No results for input(s): VITAMINB12, FOLATE, FERRITIN, TIBC, IRON, RETICCTPCT in the last 72 hours. Urinalysis    Component Value Date/Time   COLORURINE YELLOW 02/04/2019 1814   APPEARANCEUR CLEAR 02/04/2019 1814   LABSPEC 1.011 02/04/2019 1814   PHURINE 7.0 02/04/2019 1814   GLUCOSEU NEGATIVE 02/04/2019 1814   HGBUR NEGATIVE 02/04/2019 1814   BILIRUBINUR NEGATIVE 02/04/2019 1814   KETONESUR NEGATIVE 02/04/2019 1814   PROTEINUR NEGATIVE 02/04/2019 1814   NITRITE NEGATIVE 02/04/2019 1814   LEUKOCYTESUR NEGATIVE 02/04/2019 1814   Sepsis Labs Invalid input(s): PROCALCITONIN,  WBC,  LACTICIDVEN Microbiology Recent Results (from the past 240  hour(s))  Novel Coronavirus, NAA (hospital order; send-out to ref lab)     Status: None   Collection Time: 02/09/19 10:00 AM   Specimen: Nasopharyngeal Swab; Respiratory  Result Value Ref Range Status   SARS-CoV-2, NAA NOT DETECTED NOT DETECTED Final    Comment: (NOTE) Testing was performed using the cobas(R) SARS-CoV-2 test. This test was developed and its performance characteristics determined by [facility]. This test has not been FDA cleared or approved. This test has been authorized by FDA under an Emergency Use Authorization (EUA). This test is only authorized for the duration of time the declaration that circumstances exist justifying the authorization of the emergency use of in vitro diagnostic tests for detection of SARS-CoV-2 virus and/or diagnosis of COVID-19 infection under section 564(b)(1) of the Act, 21 U.S.C. 161WRU-0(A)(5360bbb-3(b)(1), unless the authorization is terminated or revoked sooner. When diagnostic testing is negative, the possibility of a false negative result should be considered in the context of a patient's recent exposures and the presence of clinical signs and symptoms consistent with COVID-19. An individual without symptoms of COVID-19 and who is not shedding SARS-CoV-2 virus would expect to have  a negative (not detected) result in this assay. Performed At: [facility] [address] [place], [place] [id number] Jolene SchimkeNagendra Sanjai Jerry Humphrey [id number]:[phone]:[phone]    Coronavirus Source NASOPHARYNGEAL  Final    Comment: Performed at [facility], [address] [place], [place] [id number]     Time coordinating discharge: 35 minutes  SIGNED:   Lanae Boastamesh Lizbett Garciagarcia, Jerry Humphrey  [facility] 02/16/2019, 1:27 PM  If 7PM-7AM, please contact night-coverage [email or web address]

## 2019-02-16 NOTE — TOC Progression Note (Signed)
Transition of Care (TOC) - Progression Note     Durable Medical Equipment  (From admission, onward)         Start     Ordered   02/16/19 1119  DME 3-in-1  Once     02/16/19 1121   02/16/19 1119  For home use only DME Walker rolling  [facility])  Once    Question:  Patient needs a walker to treat with the following condition  Answer:  Tibia/fibula fracture   02/16/19 1121   02/16/19 1119  For home use only DME wheelchair cushion (seat and back)  (Wheelchairs)  Once     02/16/19 1121          Patient Details  Name: Jerry Humphrey MRN: [id number] Date of Birth: [birth year]  Transition of Care [facility]) CM/SW Contact  Wladyslawa Disbro, Juliann Pulse, RN Phone Number: 02/16/2019, 11:45 AM  Clinical Narrative:       Expected Discharge Plan: Skilled Nursing Facility Barriers to Discharge: Other (comment)(patient will stay with his brother [name] who can accept him either Monday or Tuesday)  Expected Discharge Plan and Services Expected Discharge Plan: [place]   Discharge Planning Services: CM Consult   Living arrangements for the past 2 months: Apartment Expected Discharge Date: 02/16/19                           [facility] Agency: [place] Date [facility] Agency Contacted: 02/13/19 Time [place]: 1252 Representative spoke with at [place]: [place] ([place]) Interventions    Readmission Risk Interventions No flowsheet data found.

## 2019-02-16 NOTE — TOC Progression Note (Signed)
Transition of Care [facility]) - Progression Note    Patient Details  Name: RASHUN GRATTAN MRN: [id number] Date of Birth: [birth year]  Transition of Care [facility]) CM/SW Contact  Henderson Frampton, Juliann Pulse, RN Phone Number: 02/16/2019, 11:45 AM  Clinical Narrative:       Expected Discharge Plan: Skilled Nursing Facility Barriers to Discharge: Other (comment)(patient will stay with his brother [name] who can accept him either Monday or Tuesday)  Expected Discharge Plan and Services Expected Discharge Plan: [place]   Discharge Planning Services: CM Consult   Living arrangements for the past 2 months: Apartment Expected Discharge Date: 02/16/19                           [facility] Agency: [place] Date [facility] Agency Contacted: 02/13/19 Time [place]: 1252 Representative spoke with at [place]: [place] ([place]) Interventions    Readmission Risk Interventions No flowsheet data found.

## 2019-02-16 NOTE — TOC Progression Note (Signed)
Transition of Care [facility]) - Progression Note    Patient Details  Name: Jerry Humphrey MRN: [id number] Date of Birth: May 17, 1961  Transition of Care [facility]) CM/SW Contact  Fatma Rutten, Juliann Pulse, RN Phone Number: 02/16/2019, 1:44 PM  Clinical Narrative:  2nd attempted TC to patient's  brother Keith-c#[phone] no answer & unable to leave message-voicemail full.      Expected Discharge Plan: Skilled Nursing Facility Barriers to Discharge: Other (comment)(patient will stay with his brother [name] who can accept him either Monday or Tuesday)  Expected Discharge Plan and Services Expected Discharge Plan: [place]   Discharge Planning Services: CM Consult   Living arrangements for the past 2 months: Apartment Expected Discharge Date: 02/16/19                           [facility] Agency: [place] Date [facility] Agency Contacted: 02/13/19 Time [place]: 1252 Representative spoke with at [place]: [place] ([place]) Interventions    Readmission Risk Interventions No flowsheet data found.

## 2019-02-16 NOTE — TOC Transition Note (Signed)
Transition of Care [facility]) - CM/SW Discharge Note   Patient Details  Name: Jerry Humphrey MRN: [id number] Date of Birth: 1960-10-16  Transition of Care [facility]) CM/SW Contact:  Dessa Phi, RN Phone Number: 02/16/2019, 3:30 PM   Clinical Narrative: Finally able to reach family member-Niece Cassandra-able to pick patient up & take to [facility] home(her father)-[employer]-has delivered all dme. Lenn Cal aware of d/c today. No further CM needs.       Final next level of care: Home w Home Health Services Barriers to Discharge: Other (comment)(patient will stay with his brother [name] who can accept him either Monday or Tuesday)   Patient Goals and CMS Choice        Discharge Placement                       Discharge Plan and Services   Discharge Planning Services: CM Consult                        [facility] Agency: [place] Date [place]: 02/13/19 Time [place]: 1252 Representative spoke with at [place]: [place] Determinants of Health ([place]) Interventions     Readmission Risk Interventions No flowsheet data found.

## 2019-02-16 NOTE — Progress Notes (Signed)
   Subjective: 15 Days Post-Op Procedure(s) (LRB): OPEN REDUCTION INTERNAL FIXATION (ORIF) TIBIA/FIBULA FRACTURE (Right)  Recheck right lower leg s/p IM nail Pt c/o mild soreness  Denies any new symptoms or issues Plan for placement later today Patient reports pain as mild.  Objective:   VITALS:   Vitals:   02/16/19 0617 02/16/19 1328  BP: 135/87 122/90  Pulse: 72 76  Resp: 20 18  Temp: 98.1 F (36.7 C) 98.2 F (36.8 C)  SpO2: 97% 98%    Right lower extremity: splint and staples removed Well healing incisions to knee and lower right leg nv intact distally No rashes, edema or signs of infection Incisions redressed  LABS Recent Labs    02/14/19 0550  HGB 12.7*  HCT 39.3  WBC 6.9  PLT 458*    Recent Labs    02/14/19 0550 02/15/19 0555  NA 136  --   K 4.2  --   BUN 16  --   CREATININE 0.95 1.06  GLUCOSE 98  --      Assessment/Plan: 15 Days Post-Op Procedure(s) (LRB): OPEN REDUCTION INTERNAL FIXATION (ORIF) TIBIA/FIBULA FRACTURE (Right) Pt had staples removed, dressing change and placement into cam boot to allow for better wound care if needed Continue non weight bearing for another 4 weeks F/u in the office in 2-4 weeks as able Pt in agreement   Merla Riches PA-C, [facility] now [employer] [address], [place], [place] [address] Phone: [phone] [email or web address] Facebook  [employer]

## 2019-02-16 NOTE — Progress Notes (Signed)
Physical Therapy Treatment Patient Details Name: Jerry PickingJoseph E Asch MRN: [id number] DOB: Oct 14, 1960 Today's Date: 02/16/2019    History of Present Illness 58 y.o. male with medical history significant of ETOH use, seizure. Fall due to stumbling over a tree stump with ankle deformity-Closed fracture of right tibia and fibula s/p ORIFdistal displaced tib/fibular fracture 7/12    PT Comments    Pt c/o CAM boot was too tight.  I checked it, it was fine.  Then pt c/o it was "heavy".  I agreed, it is heavier than his temp cast that was removed.  Pt stated he wanted to "try it out".  Advised pt, he was still NWB  For "4 more weeks" per Ortho note.  Assisted OOB to amb "hop" a functional distance.  Instructed on safety and walker use.    Follow Up Recommendations  Supervision for mobility/OOB     Equipment Recommendations  Wheelchair (measurements PT);3in1 (PT);Rolling walker with 5" wheels    Recommendations for Other Services       Precautions / Restrictions Precautions Precautions: Fall Required Braces or Orthoses: Other Brace(CAM BOOT) Restrictions Weight Bearing Restrictions: Yes RLE Weight Bearing: Non weight bearing Other Position/Activity Restrictions: advised pt he is still NWB even with CAM BOOT    Mobility  Bed Mobility Overal bed mobility: Modified Independent             General bed mobility comments: pt is self able  Transfers Overall transfer level: Needs assistance Equipment used: Rolling walker (2 wheeled) Transfers: Sit to/from Stand   Stand pivot transfers: Supervision Squat pivot transfers: Supervision     General transfer comment: for safety and to address NWB  Ambulation/Gait Ambulation/Gait assistance: Supervision;Min guard Gait Distance (Feet): 25 Feet Assistive device: Rolling walker (2 wheeled) Gait Pattern/deviations: Step-to pattern Gait velocity: decreased   General Gait Details: increased ability to "hop" safely a functional distance,  maintains NWB, improved ability to safely wt shift to UEs   Stairs             Wheelchair Mobility    Modified Rankin (Stroke Patients Only)       Balance                                            Cognition Arousal/Alertness: Awake/alert Behavior During Therapy: WFL for tasks assessed/performed Overall Cognitive Status: Within Functional Limits for tasks assessed                                        Exercises      General Comments        Pertinent Vitals/Pain Pain Assessment: Faces Faces Pain Scale: Hurts little more Pain Location: R LE Pain Descriptors / Indicators: Aching;Throbbing Pain Intervention(s): Monitored during session;Repositioned    Home Living                      Prior Function            PT Goals (current goals can now be found in the care plan section) Progress towards PT goals: Progressing toward goals    Frequency    Min 3X/week      PT Plan Current plan remains appropriate    Co-evaluation  AM-PAC PT "6 Clicks" Mobility   Outcome Measure  Help needed turning from your back to your side while in a flat bed without using bedrails?: None Help needed moving from lying on your back to sitting on the side of a flat bed without using bedrails?: None Help needed moving to and from a bed to a chair (including a wheelchair)?: None Help needed standing up from a chair using your arms (e.g., wheelchair or bedside chair)?: A Little Help needed to walk in hospital room?: A Little Help needed climbing 3-5 steps with a railing? : A Little 6 Click Score: 21    End of Session Equipment Utilized During Treatment: Gait belt Activity Tolerance: Patient tolerated treatment well Patient left: in chair;with call bell/phone within reach;with chair alarm set   PT Visit Diagnosis: Unsteadiness on feet (R26.81);Muscle weakness (generalized) (M62.81);History of falling  (Z91.81);Pain Pain - Right/Left: Right Pain - part of body: Leg     Time: 9381-8299 PT Time Calculation (min) (ACUTE ONLY): 13 min  Charges:  $Gait Training: 8-22 mins                     Rica Koyanagi  PTA Acute  Rehabilitation Services Pager      [phone] Office      [phone]

## 2019-03-03 ENCOUNTER — Ambulatory Visit: Payer: Self-pay | Admitting: Family Medicine

## 2019-04-13 ENCOUNTER — Encounter (HOSPITAL_COMMUNITY): Payer: Self-pay | Admitting: Orthopedic Surgery

## 2021-08-13 ENCOUNTER — Encounter (HOSPITAL_COMMUNITY): Payer: Self-pay

## 2021-08-13 ENCOUNTER — Emergency Department (HOSPITAL_COMMUNITY): Payer: Medicaid Other

## 2021-08-13 ENCOUNTER — Inpatient Hospital Stay (HOSPITAL_COMMUNITY)
Admission: EM | Admit: 2021-08-13 | Discharge: 2021-09-01 | DRG: 065 | Disposition: A | Discharge status: Skilled Nursing Facility | Payer: Medicaid Other | Attending: Family Medicine | Admitting: Family Medicine

## 2021-08-13 ENCOUNTER — Other Ambulatory Visit: Payer: Self-pay

## 2021-08-13 DIAGNOSIS — R29810 Facial weakness: Secondary | ICD-10-CM | POA: Diagnosis present

## 2021-08-13 DIAGNOSIS — G4700 Insomnia, unspecified: Secondary | ICD-10-CM | POA: Diagnosis not present

## 2021-08-13 DIAGNOSIS — R471 Dysarthria and anarthria: Secondary | ICD-10-CM | POA: Diagnosis present

## 2021-08-13 DIAGNOSIS — Z9114 Patient's other noncompliance with medication regimen: Secondary | ICD-10-CM

## 2021-08-13 DIAGNOSIS — I610 Nontraumatic intracerebral hemorrhage in hemisphere, subcortical: Secondary | ICD-10-CM | POA: Diagnosis present

## 2021-08-13 DIAGNOSIS — R778 Other specified abnormalities of plasma proteins: Secondary | ICD-10-CM | POA: Diagnosis present

## 2021-08-13 DIAGNOSIS — G8194 Hemiplegia, unspecified affecting left nondominant side: Secondary | ICD-10-CM | POA: Diagnosis present

## 2021-08-13 DIAGNOSIS — I10 Essential (primary) hypertension: Secondary | ICD-10-CM | POA: Diagnosis present

## 2021-08-13 DIAGNOSIS — E876 Hypokalemia: Secondary | ICD-10-CM | POA: Diagnosis not present

## 2021-08-13 DIAGNOSIS — E785 Hyperlipidemia, unspecified: Secondary | ICD-10-CM | POA: Diagnosis present

## 2021-08-13 DIAGNOSIS — G40909 Epilepsy, unspecified, not intractable, without status epilepticus: Secondary | ICD-10-CM | POA: Diagnosis present

## 2021-08-13 DIAGNOSIS — Z7141 Alcohol abuse counseling and surveillance of alcoholic: Secondary | ICD-10-CM

## 2021-08-13 DIAGNOSIS — E871 Hypo-osmolality and hyponatremia: Secondary | ICD-10-CM | POA: Diagnosis present

## 2021-08-13 DIAGNOSIS — R45851 Suicidal ideations: Secondary | ICD-10-CM | POA: Diagnosis not present

## 2021-08-13 DIAGNOSIS — F1090 Alcohol use, unspecified, uncomplicated: Secondary | ICD-10-CM | POA: Diagnosis present

## 2021-08-13 DIAGNOSIS — Z79899 Other long term (current) drug therapy: Secondary | ICD-10-CM | POA: Diagnosis not present

## 2021-08-13 DIAGNOSIS — M25561 Pain in right knee: Secondary | ICD-10-CM | POA: Diagnosis present

## 2021-08-13 DIAGNOSIS — Z23 Encounter for immunization: Secondary | ICD-10-CM | POA: Diagnosis not present

## 2021-08-13 DIAGNOSIS — D72829 Elevated white blood cell count, unspecified: Secondary | ICD-10-CM | POA: Diagnosis present

## 2021-08-13 DIAGNOSIS — W19XXXA Unspecified fall, initial encounter: Secondary | ICD-10-CM | POA: Diagnosis present

## 2021-08-13 DIAGNOSIS — F17210 Nicotine dependence, cigarettes, uncomplicated: Secondary | ICD-10-CM | POA: Diagnosis present

## 2021-08-13 DIAGNOSIS — F17200 Nicotine dependence, unspecified, uncomplicated: Secondary | ICD-10-CM

## 2021-08-13 DIAGNOSIS — I619 Nontraumatic intracerebral hemorrhage, unspecified: Secondary | ICD-10-CM

## 2021-08-13 DIAGNOSIS — Z885 Allergy status to narcotic agent status: Secondary | ICD-10-CM | POA: Diagnosis not present

## 2021-08-13 DIAGNOSIS — Z20822 Contact with and (suspected) exposure to covid-19: Secondary | ICD-10-CM | POA: Diagnosis not present

## 2021-08-13 DIAGNOSIS — M25569 Pain in unspecified knee: Secondary | ICD-10-CM

## 2021-08-13 DIAGNOSIS — I612 Nontraumatic intracerebral hemorrhage in hemisphere, unspecified: Secondary | ICD-10-CM

## 2021-08-13 DIAGNOSIS — Z789 Other specified health status: Secondary | ICD-10-CM

## 2021-08-13 HISTORY — DX: Alcohol abuse, uncomplicated: F10.10

## 2021-08-13 LAB — CBC WITH DIFFERENTIAL/PLATELET
Abs Immature Granulocytes: 0.06 10*3/uL (ref 0.00–0.07)
Basophils Absolute: 0.1 10*3/uL (ref 0.0–0.1)
Basophils Relative: 1 %
Eosinophils Absolute: 0.1 10*3/uL (ref 0.0–0.5)
Eosinophils Relative: 1 %
HCT: 49.7 % (ref 39.0–52.0)
Hemoglobin: 16.6 g/dL (ref 13.0–17.0)
Immature Granulocytes: 0 %
Lymphocytes Relative: 21 %
Lymphs Abs: 3.2 10*3/uL (ref 0.7–4.0)
MCH: 28.5 pg (ref 26.0–34.0)
MCHC: 33.4 g/dL (ref 30.0–36.0)
MCV: 85.2 fL (ref 80.0–100.0)
Monocytes Absolute: 1.7 10*3/uL — ABNORMAL HIGH (ref 0.1–1.0)
Monocytes Relative: 11 %
Neutro Abs: 10.3 10*3/uL — ABNORMAL HIGH (ref 1.7–7.7)
Neutrophils Relative %: 66 %
Platelets: 330 10*3/uL (ref 150–400)
RBC: 5.83 MIL/uL — ABNORMAL HIGH (ref 4.22–5.81)
RDW: 12.6 % (ref 11.5–15.5)
WBC: 15.3 10*3/uL — ABNORMAL HIGH (ref 4.0–10.5)
nRBC: 0 % (ref 0.0–0.2)

## 2021-08-13 LAB — I-STAT CHEM 8, ED
BUN: 39 mg/dL — ABNORMAL HIGH (ref 8–23)
Calcium, Ion: 1.07 mmol/L — ABNORMAL LOW (ref 1.15–1.40)
Chloride: 97 mmol/L — ABNORMAL LOW (ref 98–111)
Creatinine, Ser: 1.2 mg/dL (ref 0.61–1.24)
Glucose, Bld: 100 mg/dL — ABNORMAL HIGH (ref 70–99)
HCT: 52 % (ref 39.0–52.0)
Hemoglobin: 17.7 g/dL — ABNORMAL HIGH (ref 13.0–17.0)
Potassium: 4 mmol/L (ref 3.5–5.1)
Sodium: 134 mmol/L — ABNORMAL LOW (ref 135–145)
TCO2: 28 mmol/L (ref 22–32)

## 2021-08-13 LAB — TROPONIN I (HIGH SENSITIVITY)
Troponin I (High Sensitivity): 66 ng/L — ABNORMAL HIGH (ref <18)
Troponin I (High Sensitivity): 68 ng/L — ABNORMAL HIGH (ref <18)

## 2021-08-13 LAB — CK: Total CK: 381 U/L (ref 49–397)

## 2021-08-13 LAB — PROTIME-INR
INR: 1.1 (ref 0.8–1.2)
Prothrombin Time: 13.9 seconds (ref 11.4–15.2)

## 2021-08-13 LAB — COMPREHENSIVE METABOLIC PANEL
ALT: 74 U/L — ABNORMAL HIGH (ref 0–44)
AST: 64 U/L — ABNORMAL HIGH (ref 15–41)
Albumin: 3.9 g/dL (ref 3.5–5.0)
Alkaline Phosphatase: 55 U/L (ref 38–126)
Anion gap: 12 (ref 5–15)
BUN: 37 mg/dL — ABNORMAL HIGH (ref 8–23)
CO2: 26 mmol/L (ref 22–32)
Calcium: 9.2 mg/dL (ref 8.9–10.3)
Chloride: 94 mmol/L — ABNORMAL LOW (ref 98–111)
Creatinine, Ser: 1.28 mg/dL — ABNORMAL HIGH (ref 0.61–1.24)
GFR, Estimated: >60 mL/min (ref >60)
Glucose, Bld: 102 mg/dL — ABNORMAL HIGH (ref 70–99)
Potassium: 3.9 mmol/L (ref 3.5–5.1)
Sodium: 132 mmol/L — ABNORMAL LOW (ref 135–145)
Total Bilirubin: 1.9 mg/dL — ABNORMAL HIGH (ref 0.3–1.2)
Total Protein: 8 g/dL (ref 6.5–8.1)

## 2021-08-13 IMAGING — CT CT ANGIO HEAD-NECK (W OR W/O PERF)
2 of 7 series · 8 of 34 positions shown · IV contrast (APPLIED)
Comparison: Head CT from earlier the same day.

CLINICAL DATA: Initial evaluation for neuro deficit, stroke.

EXAM:
CT ANGIOGRAPHY HEAD AND NECK
TECHNIQUE: Multidetector CT imaging of the head and neck was performed using
the standard protocol during bolus administration of intravenous
contrast. Multiplanar CT image reconstructions and MIPs were
obtained to evaluate the vascular anatomy. Carotid stenosis
measurements (when applicable) are obtained utilizing NASCET
criteria, using the distal internal carotid diameter as the
denominator.

[Series 5: cta neck/head · axial · 0.57mm/px · z∈[-210,-96]mm · 2 of 173 slices shown]
[im 58/173  soft-tissue]
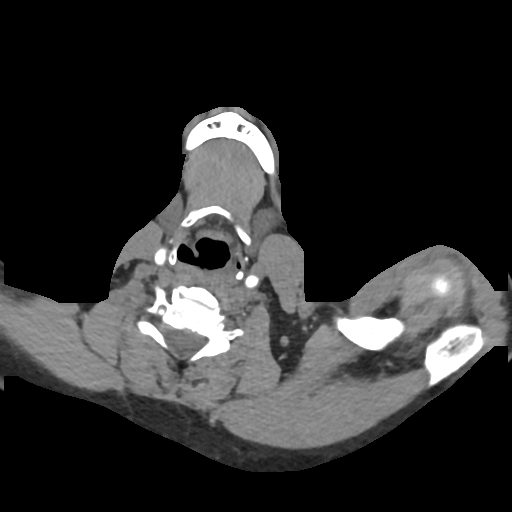
[im 115/173  soft-tissue]
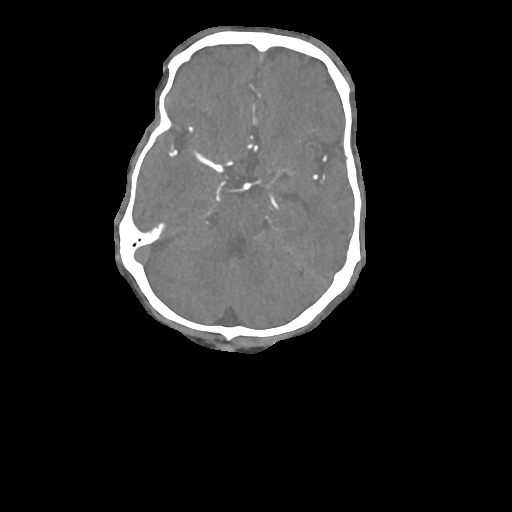

[Series 7: ax thins · axial · 0.44mm/px · z∈[-275,-30]mm · 6 of 343 slices shown]
[im 49/343  soft-tissue]
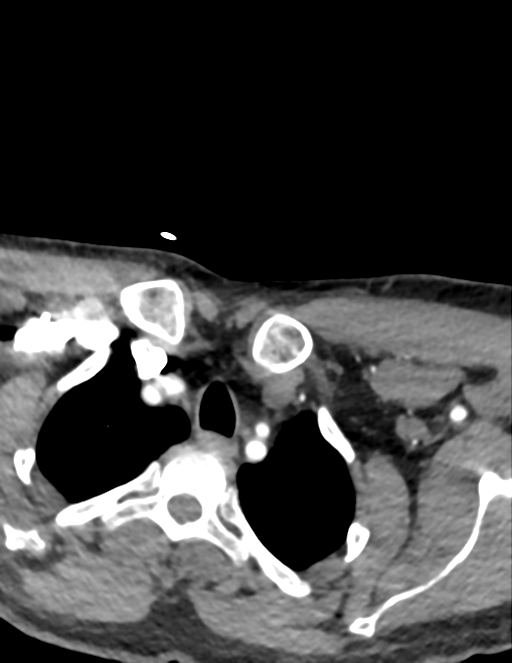
[im 98/343  bone]
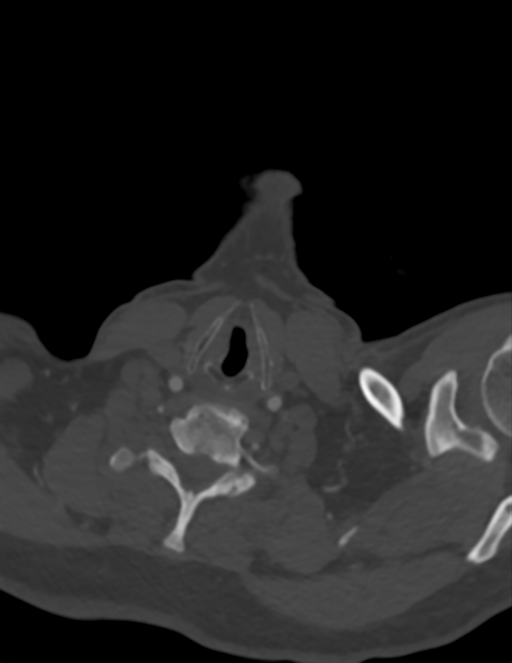
[im 147/343  soft-tissue]
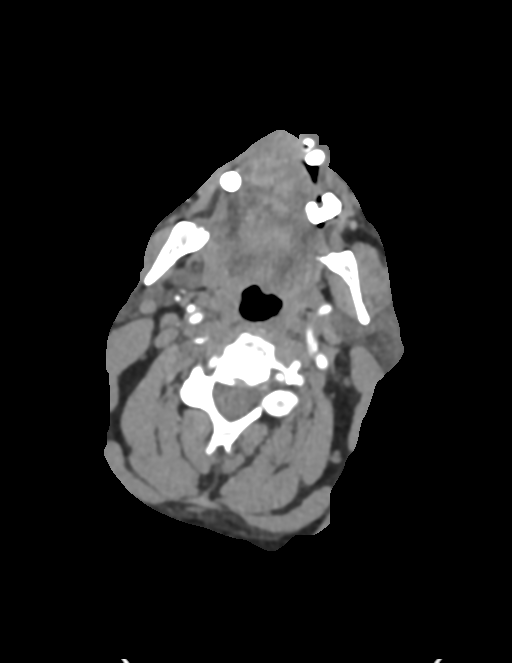
[im 196/343  bone]
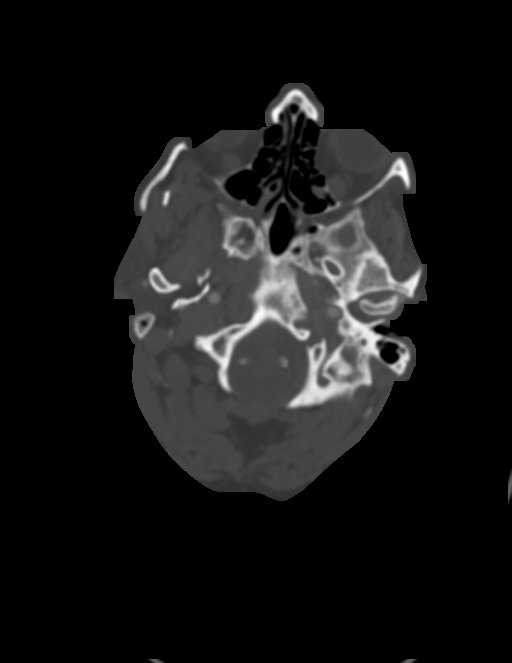
[im 245/343  soft-tissue]
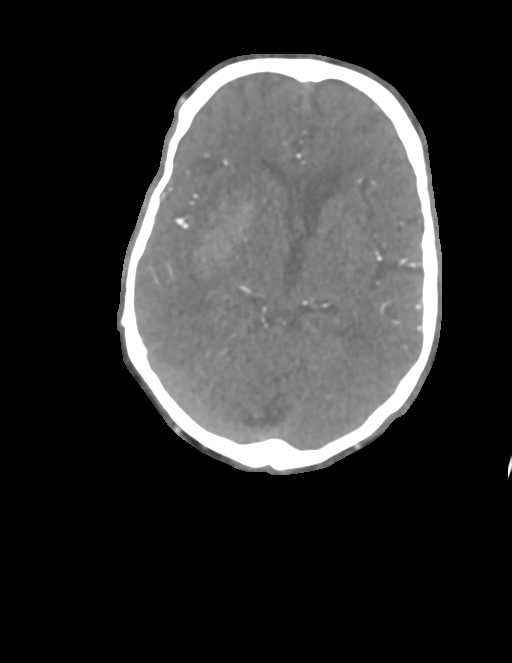
[im 294/343  bone]
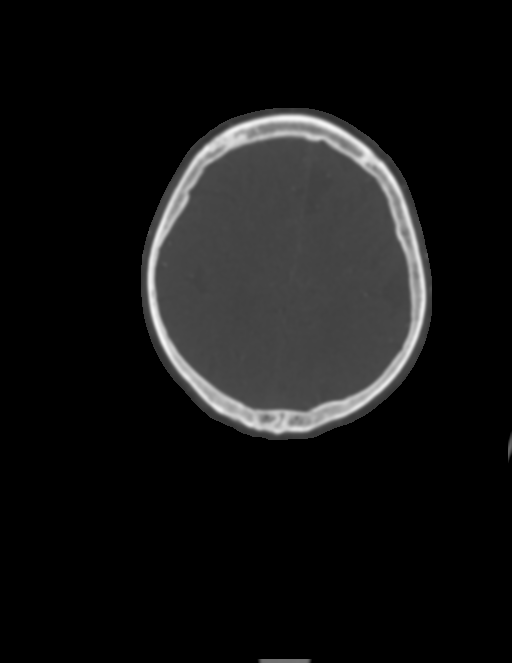

[8 of 34 positions shown; findings below may reference images not displayed]

RADIATION DOSE REDUCTION: This exam was performed according to the
departmental dose-optimization program which includes automated
exposure control, adjustment of the mA and/or kV according to
patient size and/or use of iterative reconstruction technique.

CONTRAST:  75mL OMNIPAQUE IOHEXOL 350 MG/ML SOLN
FINDINGS: CTA NECK FINDINGS

Aortic arch: Visualized aortic arch normal caliber with normal
branch pattern. No stenosis about the origin the great vessels. Mild
aortic atherosclerosis noted.

Right carotid system: Right CCA patent without stenosis or
dissection. Mixed plaque about the right carotid bulb/proximal right
ICA without hemodynamically significant stenosis. Right ICA patent
distally without stenosis or dissection.

Left carotid system: Left common and internal carotid arteries
patent without stenosis or dissection. Mixed plaque about the left
carotid bulb/proximal left ICA without significant stenosis.

Vertebral arteries: Both vertebral arteries arise from the
subclavian arteries. Vertebral arteries patent without stenosis or
evidence for dissection.

Skeleton: No discrete or worrisome osseous lesions.

Other neck: No other acute soft tissue abnormality within the neck.

Upper chest: Visualized upper chest demonstrates no acute finding.

Review of the MIP images confirms the above findings

CTA HEAD FINDINGS

Anterior circulation: Petrous segments patent bilaterally.
Atheromatous change throughout the carotid siphons without
hemodynamically significant stenosis. A1 segments patent
bilaterally. Normal anterior communicating artery complex. Anterior
cerebral arteries patent without stenosis. No M1 stenosis or
occlusion. Normal MCA bifurcations. Distal MCA branches perfused and
symmetric.

Posterior circulation: Atheromatous plaque within the proximal V4
segments bilaterally with no more than mild stenosis. Vertebral
arteries otherwise widely patent. Both PICA patent. Basilar patent
to its distal aspect without stenosis. Superior cerebral arteries
patent bilaterally. Right PCA supplied via the basilar. Left PCA
supplied via a hypoplastic left P1 segment and robust left posterior
communicating artery. Both PCAs well perfused or distal aspects.

Venous sinuses: Not well assessed due to timing the contrast bolus.

Anatomic variants: None significant. No aneurysm. No vascular
abnormality seen underlying the right basal ganglia hemorrhage.

Review of the MIP images confirms the above findings
IMPRESSION: 1. Negative CTA for large vessel occlusion.
2. Atheromatous disease about the carotid bifurcations and carotid
siphons without hemodynamically significant or correctable stenosis.
3. No vascular abnormality seen underlying the right basal ganglia
hemorrhage.

Aortic Atherosclerosis ([D6]-[D6]).

## 2021-08-13 MED ORDER — IOHEXOL 350 MG/ML SOLN
75.0000 mL | Freq: Once | INTRAVENOUS | Status: AC | PRN
  Administered 2021-08-13: 75 mL via INTRAVENOUS

## 2021-08-13 MED ORDER — ADULT MULTIVITAMIN W/MINERALS CH
1.0000 | ORAL_TABLET | Freq: Every day | ORAL | Status: DC
  Administered 2021-08-14 – 2021-09-01 (×19): 1 via ORAL
  Filled 2021-08-13 (×19): qty 1

## 2021-08-13 MED ORDER — ACETAMINOPHEN 650 MG RE SUPP: 650.0000 mg | Freq: Four times a day (QID) | RECTAL | Status: DC | PRN

## 2021-08-13 MED ORDER — LORAZEPAM 1 MG PO TABS: 1.0000 mg | ORAL_TABLET | ORAL | Status: DC | PRN

## 2021-08-13 MED ORDER — THIAMINE HCL 100 MG/ML IJ SOLN
100.0000 mg | Freq: Every day | INTRAMUSCULAR | Status: DC
  Administered 2021-08-14: 100 mg via INTRAVENOUS
  Filled 2021-08-13 (×4): qty 2

## 2021-08-13 MED ORDER — THIAMINE HCL 100 MG PO TABS
100.0000 mg | ORAL_TABLET | Freq: Every day | ORAL | Status: DC
  Administered 2021-08-15 – 2021-09-01 (×18): 100 mg via ORAL
  Filled 2021-08-13 (×18): qty 1

## 2021-08-13 MED ORDER — FOLIC ACID 1 MG PO TABS
1.0000 mg | ORAL_TABLET | Freq: Every day | ORAL | Status: DC
  Administered 2021-08-14 – 2021-09-01 (×19): 1 mg via ORAL
  Filled 2021-08-13 (×19): qty 1

## 2021-08-13 MED ORDER — MANNITOL 25 % IV SOLN
25.0000 g | Freq: Once | INTRAVENOUS | Status: DC
Start: 2021-08-13 — End: 2021-08-13

## 2021-08-13 MED ORDER — LORAZEPAM 2 MG/ML IJ SOLN: 1.0000 mg | INTRAMUSCULAR | Status: DC | PRN

## 2021-08-13 MED ORDER — SODIUM CHLORIDE 0.9 % IV SOLN: INTRAVENOUS | Status: DC

## 2021-08-13 MED ORDER — ACETAMINOPHEN 325 MG PO TABS
650.0000 mg | ORAL_TABLET | Freq: Four times a day (QID) | ORAL | Status: DC | PRN
  Administered 2021-08-15 – 2021-09-01 (×7): 650 mg via ORAL
  Filled 2021-08-13 (×7): qty 2

## 2021-08-13 NOTE — ED Notes (Signed)
Pt given sponges to suck on.

## 2021-08-13 NOTE — Consult Note (Signed)
Neurology Consultation Reason for Consult: Stroke Referring Physician: Audrie Lia, M  CC: Left-sided weakness  History is obtained from: Patient  HPI: Jerry Humphrey is a 61 y.o. male with a history of hypertension, alcohol withdrawal seizures who has been out of his hypertensive medications for a couple of months.  He was in his normal state of health until Wednesday when he had had a few drinks and then developed sudden onset left-sided weakness and headache.  His companions suspected that he had just had too much to drink and therefore helped him inside.  He states that he has been stable since that time, but unable to get up or reach a phone call 911.  His brother finally checked on him today and asked him if he was okay, at which the patient said no and therefore he called 911 for him.   LKW: Wednesday tpa given?: no, ICH ICH Score: 0    ROS: A 14 point ROS was performed and is negative except as noted in the HPI.    Past Medical History:  Diagnosis Date   ETOH abuse    Hypertension    Seizures (HCC)      Family History  Problem Relation Age of Onset   Diabetes Neg Hx    CAD Neg Hx      Social History:  reports that he has been smoking cigarettes. He has been smoking an average of .5 packs per day. He has never used smokeless tobacco. He reports current alcohol use of about 3.0 standard drinks per week. He reports that he does not use drugs.   Exam: Current vital signs: BP (!) 132/98 (BP Location: Right Arm)    Pulse 92    Temp 98.1 F (36.7 C) (Oral)    Resp 19    Ht 5\' 8"  (1.727 m)    Wt 82.6 kg    SpO2 97%    BMI 27.69 kg/m  Vital signs in last 24 hours: Temp:  [98.1 F (36.7 C)] 98.1 F (36.7 C) (01/22 1802) Pulse Rate:  [85-105] 92 (01/22 1941) Resp:  [16-19] 19 (01/22 1941) BP: (112-132)/(97-98) 132/98 (01/22 1941) SpO2:  [96 %-98 %] 97 % (01/22 1941) Weight:  [82.6 kg] 82.6 kg (01/22 1816)   Physical Exam  Constitutional: Appears well-developed and  well-nourished.  Psych: Affect appropriate to situation Eyes: No scleral injection HENT: No OP obstruction MSK: no joint deformities.  Cardiovascular: Normal rate and regular rhythm.  Respiratory: Effort normal, non-labored breathing GI: Soft.  No distension. There is no tenderness.  Skin: WDI  Neuro: Mental Status: Patient is awake, alert, oriented to person, place,  year, and situation.  He is a month is February Patient is able to give a clear and coherent history. No signs of aphasia Cranial Nerves: II: Visual Fields are full. Pupils are equal, round, and reactive to light.   III,IV, VI: EOMI without ptosis or diploplia.  V: Facial sensation is diminished on the left VII: Facial movement is weak on the left Motor: Tone is normal. Bulk is normal. 5/5 strength was present on the right, on the left he has 2/5 in the left arm, 3/5 in left leg Sensory: Sensation is diminished on the left.  Cerebellar: FNF and HKS intact on the right.    I have reviewed labs in epic and the results pertinent to this consultation are:  Na 134 Cr 1.2 WBC 15 Hgb 17.7  I have reviewed the images obtained:CT head - left subcortical hemorrhage.  Impression: 61 year old male with 4-day old hemorrhage in the subcortical right hemisphere.  He reports stability of his symptoms since that time.  He is not particular hypertensive at this time, though unclear what he was when he first had the symptoms.  He does seem a little hemoconcentrated with a hemoglobin of 17.7 and therefore may be slightly volume down.  At this point, over the long-term he will need hypertension control, avoidance of antithrombotics and anticoagulants, therapy evaluations.  Recommendations: 1) Admit to stepdown 2) no antiplatelets or anticoagulants 3) blood pressure control with goal systolic < 160, no need for aggressive drips given duration of symptoms 4) Frequent neuro checks 5) If symptoms worsen or there is decreased mental  status, repeat stat head CT, otherwise repeat CT in the morning. 6) Q2H neurochecks x 12 hours, then q4h 7) PT,OT,ST   Ritta Slot, MD [facility] [phone]  If 7pm- 7am, please page neurology on call as listed in AMION.

## 2021-08-13 NOTE — ED Triage Notes (Signed)
Pt arrived via [facility] from home. Pt lives in a mobile home behind his brother's house. Brother found pt on the floor of the mobile home. Pt told his brother he had been laying there for 2 days, because he couldn't get up. Pt c/o left side weakness/numbness and HAx3 days. Pt's LKW was Wednesday. Pt is A&Ox4. NSR on monitor

## 2021-08-13 NOTE — ED Provider Notes (Signed)
[facility] EMERGENCY DEPARTMENT Provider Note  CSN: [id number] Arrival date & time: 08/13/21 1743  Chief Complaint(s) Stroke Symptoms  HPI Jerry Humphrey is a 61 y.o. male with PMH alcohol abuse, HTN, seizure disorder who presents the emergency department for evaluation of left-sided weakness.  History obtained from EMS who states that the patient was found down in his trailer and has apparently been on the ground for 3 days.  Patient states that he fell out of his car and he was dragged into his trailer by his family and he is unsure why nobody called an ambulance.  Patient has been laying on his left side and on initial evaluation has left-sided weakness of the upper and lower extremities as well as a left-sided facial droop.  HPI  Past Medical History Past Medical History:  Diagnosis Date   ETOH abuse    Hypertension    Seizures (HCC)    Patient Active Problem List   Diagnosis Date Noted   Closed fracture of right distal tibia    Tibia/fibula fracture 02/01/2019   Alcohol abuse 01/30/2019   Chest pain 01/30/2019   Leukocytosis 01/30/2019   Tobacco abuse 01/30/2019   Closed fracture of right tibia and fibula 01/30/2019   Fall due to stumbling 01/30/2019   Seizure disorder (HCC) 01/30/2019   Home Medication(s) Prior to Admission medications   Medication Sig Start Date End Date Taking? Authorizing Provider  amLODipine (NORVASC) 5 MG tablet Take 1 tablet (5 mg total) by mouth daily. 02/17/19 03/19/19  Lanae Boast, MD  methocarbamol (ROBAXIN) 500 MG tablet Take 1 tablet (500 mg total) by mouth 3 (three) times daily as needed for up to 30 doses. 02/16/19   Lanae Boast, MD  oxyCODONE-acetaminophen (PERCOCET/ROXICET) 5-325 MG tablet Take 1-2 tablets by mouth every 6 (six) hours as needed for severe pain. 02/01/19   Beverely Low, MD  pantoprazole (PROTONIX) 40 MG tablet Take 1 tablet (40 mg total) by mouth daily. 02/16/19 03/18/19  Lanae Boast, MD                                                                                                                                     Past Surgical History Past Surgical History:  Procedure Laterality Date   OPEN REDUCTION INTERNAL FIXATION (ORIF) TIBIA/FIBULA FRACTURE Right 02/01/2019   Procedure: OPEN REDUCTION INTERNAL FIXATION (ORIF) TIBIA/FIBULA FRACTURE;  Surgeon: Beverely Low, MD;  Location: [facility];  Service: Orthopedics;  Laterality: Right;   Family History Family History  Problem Relation Age of Onset   Diabetes Neg Hx    CAD Neg Hx     Social History Social History   Tobacco Use   Smoking status: Every Day    Packs/day: 0.50    Types: Cigarettes   Smokeless tobacco: Never  Substance Use Topics   Alcohol use: Yes    Alcohol/week: 3.0 standard drinks    Types: 3 Cans  of beer per week    Comment: 3 64 oz can of beer   Drug use: Never   Allergies Codeine  Review of Systems Review of Systems  Neurological:  Positive for weakness and numbness.   Physical Exam Vital Signs  I have reviewed the triage vital signs BP (!) 112/97    Pulse 85    Temp 98.1 F (36.7 C) (Oral)    Resp 16    Ht 5\' 8"  (1.727 m)    Wt 82.6 kg    SpO2 98%    BMI 27.69 kg/m   Physical Exam Vitals and nursing note reviewed.  Constitutional:      General: He is not in acute distress.    Appearance: He is well-developed.  HENT:     Head: Normocephalic and atraumatic.  Eyes:     Conjunctiva/sclera: Conjunctivae normal.  Cardiovascular:     Rate and Rhythm: Normal rate and regular rhythm.     Heart sounds: No murmur heard. Pulmonary:     Effort: Pulmonary effort is normal. No respiratory distress.     Breath sounds: Normal breath sounds.  Abdominal:     Palpations: Abdomen is soft.     Tenderness: There is no abdominal tenderness.  Musculoskeletal:        General: No swelling.     Cervical back: Neck supple.  Skin:    General: Skin is warm and dry.     Capillary Refill: Capillary refill takes less than 2  seconds.  Neurological:     Mental Status: He is alert.     Cranial Nerves: Cranial nerve deficit present.     Sensory: Sensory deficit present.     Motor: Weakness present.  Psychiatric:        Mood and Affect: Mood normal.    ED Results and Treatments Labs (all labs ordered are listed, but only abnormal results are displayed) Labs Reviewed  CBC WITH DIFFERENTIAL/PLATELET - Abnormal; Notable for the following components:      Result Value   WBC 15.3 (*)    RBC 5.83 (*)    Neutro Abs 10.3 (*)    Monocytes Absolute 1.7 (*)    All other components within normal limits  I-STAT CHEM 8, ED - Abnormal; Notable for the following components:   Sodium 134 (*)    Chloride 97 (*)    BUN 39 (*)    Glucose, Bld 100 (*)    Calcium, Ion 1.07 (*)    Hemoglobin 17.7 (*)    All other components within normal limits  COMPREHENSIVE METABOLIC PANEL  CK  TROPONIN I (HIGH SENSITIVITY)                                                                                                                          Radiology No results found.  Pertinent labs & imaging results that were available during my care of the patient were reviewed by me and considered in my medical decision  making (see MDM for details).  Medications Ordered in ED Medications - No data to display                                                                                                                                   Procedures .Critical Care Performed by: Glendora ScoreKommor, Layken Doenges, MD Authorized by: Glendora ScoreKommor, Cuauhtemoc Huegel, MD   Critical care provider statement:    Critical care time (minutes):  30   Critical care was time spent personally by me on the following activities:  Development of treatment plan with patient or surrogate, discussions with consultants, evaluation of patient's response to treatment, examination of patient, ordering and review of laboratory studies, ordering and review of radiographic studies, ordering and  performing treatments and interventions, pulse oximetry, re-evaluation of patient's condition and review of old charts  (including critical care time)  Medical Decision Making / ED Course   This patient presents to the ED for concern of weakness, this involves an extensive number of treatment options, and is a complaint that carries with it a high risk of complications and morbidity.  The differential diagnosis includes CVA, compressive neuropathy, electrolyte abnormality, hypoglycemia, Todd's paralysis  MDM: Patient seen emergency department for evaluation of left-sided weakness.  Physical exam reveals left-sided facial droop, left-sided upper extremity and lower extremity weakness.  Patient is outside the window for tPA or thrombectomy and thus a stroke alert was not called but urgent CT head and angiography was performed showing a large intraparenchymal hemorrhage on the right.  Laboratory evaluation with a BUN of 37, creatinine 1.28, hyponatremia 132, hypochloremia to 94, AST 64, ALT 74, leukocytosis to 15.3.  Despite being on the ground for multiple days patient CK is normal at 381.  High sensitive troponin elevated 68.  Blood pressure under control and not requiring active hypertensive management.  Neurology was consulted who recommended medical admission for completion of stroke work-up and likely placement.  Patient then admitted.   Additional history obtained:  -External records from outside source obtained and reviewed including: Chart review including previous notes, labs, imaging, consultation notes   Lab Tests: -I ordered, reviewed, and interpreted labs.   The pertinent results include:   Labs Reviewed  CBC WITH DIFFERENTIAL/PLATELET - Abnormal; Notable for the following components:      Result Value   WBC 15.3 (*)    RBC 5.83 (*)    Neutro Abs 10.3 (*)    Monocytes Absolute 1.7 (*)    All other components within normal limits  I-STAT CHEM 8, ED - Abnormal; Notable for the  following components:   Sodium 134 (*)    Chloride 97 (*)    BUN 39 (*)    Glucose, Bld 100 (*)    Calcium, Ion 1.07 (*)    Hemoglobin 17.7 (*)    All other components within normal limits  COMPREHENSIVE METABOLIC PANEL  CK  TROPONIN I (HIGH SENSITIVITY)  EKG   EKG Interpretation  Date/Time:    Ventricular Rate:    PR Interval:    QRS Duration:   QT Interval:    QTC Calculation:   R Axis:     Text Interpretation:           Imaging Studies ordered: I ordered imaging studies including CT head. CT angio brain neck I independently visualized and interpreted imaging. I agree with the radiologist interpretation   Medicines ordered and prescription drug management: No orders of the defined types were placed in this encounter.   -I have reviewed the patients home medicines and have made adjustments as needed  Critical interventions Stroke management and coordination of consultants  Consultations Obtained: I requested consultation with the neurology,  and discussed lab and imaging findings as well as pertinent plan - they recommend: medical admission   Cardiac Monitoring: The patient was maintained on a cardiac monitor.  I personally viewed and interpreted the cardiac monitored which showed an underlying rhythm of: NSR  Social Determinants of Health:  Factors impacting patients care include: none   Reevaluation: After the interventions noted above, I reevaluated the patient and found that they have :stayed the same  Co morbidities that complicate the patient evaluation  Past Medical History:  Diagnosis Date   ETOH abuse    Hypertension    Seizures (HCC)       Dispostion: I considered admission for this patient, and the patient was ultimately admitted for a hemorrhagic stroke     Final Clinical Impression(s) / ED Diagnoses Final diagnoses:  None     @PCDICTATION @    Glendora ScoreKommor, Daci Stubbe, MD 08/13/21 2355

## 2021-08-13 NOTE — H&P (Addendum)
[facility] and Physical Service Pager: [phone]  Patient name: Jerry PickingJoseph E Heeren Medical record number: [id number] Date of birth: 07-06-1961 Age: 61 y.o. Gender: male  Primary Care Provider: Pcp, No Consultants: Neurology Code Status: Full  Preferred Emergency Contact:  Contact Information     Name Relation Home Work Mobile   [place],Keith Brother   [phone]   Allen,Sheila Other [phone]          Chief Complaint: Left side weakness with left facial droop and dysarthria  Assessment and Plan: Jerry PickingJoseph E Lamadrid is a 61 y.o. male presenting with Left side weakness . PMH is significant for seizure due to alcohol withdrawal, HTN, Alcohol use and Tobacco use   Left side weakness concerning for stroke Patient reports falling 4 days ago and unable to get up due to left sided weakness.  He endorses paresthesia of extremities and left upper extremity weakness.  He also reports right upper extremity tingling but full strength and function of the right upper extremity.  This has been present since prior to arrival in the emergency department.  Denies any headache or loss of consciousness with fall.  No palpitations or chest pain.  On exam patient has left upper extremity weakness, profound dysarthria, facial droop and left tongue deviation. Muscle strength on RUE, RLE and LLE is 5/5 and 3/5 on LUE.  Initial labs show mildly elevated troponin 66 >68, CMP was unremarkable with no electrolyte abnormalities, other than slightly elevated AST, ALT 64 and 74 respectively.  Creatinine was 1.28 and EKG showed sinus rhythm with no ST changes.  CT neck was unremarkable however CT head showed right subcortical hemorrhage with right to left midline shift.  Neurology has been consulted who recommend avoiding anticoagulant and antiplatelet.  Blood pressure goes with systolic <160 and repeat head CT in the morning.  Discussed possible neurosurgery referral with  neurology as well on on-call provider reports its not necessary due to the fact that this occurred 4 days ago.  Appreciate their recommendation. - Admit to progressive with FPTS, attending Dr. Lum BabeEniola -Neurology consulted in the ER, appreciate recs - Continuous cardiac monitoring -PT/OT eval and treat -SLP eval -Neurochecks Q2H -Fall precaution -Placed on NPO -Continue routine vitals -SCDs for VTE prophylaxis - Risk stratify with lipid panel, TSH -Avoid anticoagulants, antiplatelets. -Blood pressure systolic goes <160  HTN BP range on admission has ranged from 112-132/85-98. BP is currently 132/86.  Home medication include amlodipine 5 mg daily.  However patient has not had this medication in the last 2 years.  No current start patient on any medication currently given stable BP.  Due to his recent CVA we will consider starting patient on BP medication for better control. - Follow routine vitals   AKI On admission Creatinine was 1.28 and improved to 1.20 on recheck. GFR >60.  No recent records to estimate his baseline.  AKI is likely creatinine prerenal secondary to hydration due to no p.o. intake in the last 4 days.  On exam patient has dry mucous membrane and dry skin's. -Avoid nephrotoxic agent -Started maintenance IV fluid at 125 ml/hour -Obtain BMP lab in the AM  Hx of Alcohol use  Hx of alcohol withdrawal with seizure Patient reports prior history of alcohol withdrawal with seizure requiring hospitalization.  He drinks on average two 64 ounce beer daily.  He reports his last pain was 4 days ago where he had to 64 ounce beer.  Exam patient appears tremulous and restless. -Start CIWA  protocol -Ativan ordered per CIWA protocol -Neurochecks every 2 hours -Fall precaution -Continue routine vitals  Hx of Tobacco use Patient reports smoking cigarettes daily.  Will consider nicotine patch however we will hold at this time due to side effects of headaches and sleep disturbance given  patient's possible alcohol withdrawal symptoms.   FEN/GI: NPO  Prophylaxis: SCDs  Disposition: Admit to progressive  History of Present Illness:  Jerry Humphrey is a 61 y.o. male presenting with left-sided weakness with dysarthria.  Patient report falling 4 days ago after drinking two 64 ounce of beer.  At the time he was unable to get up due to left-sided weakness and was helped by his brother who dropped him off at his trailer behind the house.  He endorses numbness and unable to feel his left extremities.  Has been bedridden for the past 4 because he is unable to move.  He has not had any food or water during this time.  Patient denies any headaches prior to his fall in the last 4 days.  No palpitations or chest pain.  He reports extensive history of drinking.  Drinks about 264 ounce beers daily. Has has prior history of alcohol withdrawal symptoms with seizure requiring hospitalization. He lives in a trailer behind his brother's house.  He reports that he has been living in the woods recently though because he wants to continue drinking and his brother will not let him drink in the house.  Review Of Systems: Per HPI with the following additions:   Review of Systems  Respiratory:  Negative for chest tightness and shortness of breath.   Cardiovascular:  Negative for chest pain, palpitations and leg swelling.  Neurological:  Positive for facial asymmetry, speech difficulty (Extremities), weakness (LUE), numbness (extremities) and headaches. Negative for dizziness.    Patient Active Problem List   Diagnosis Date Noted   Closed fracture of right distal tibia    Tibia/fibula fracture 02/01/2019   Alcohol abuse 01/30/2019   Chest pain 01/30/2019   Leukocytosis 01/30/2019   Tobacco abuse 01/30/2019   Closed fracture of right tibia and fibula 01/30/2019   Fall due to stumbling 01/30/2019   Seizure disorder (HCC) 01/30/2019    Past Medical History: Past Medical History:  Diagnosis  Date   ETOH abuse    Hypertension    Seizures (HCC)     Past Surgical History: Past Surgical History:  Procedure Laterality Date   OPEN REDUCTION INTERNAL FIXATION (ORIF) TIBIA/FIBULA FRACTURE Right 02/01/2019   Procedure: OPEN REDUCTION INTERNAL FIXATION (ORIF) TIBIA/FIBULA FRACTURE;  Surgeon: Beverely Low, MD;  Location: [facility];  Service: Orthopedics;  Laterality: Right;    Social History: Social History   Tobacco Use   Smoking status: Every Day    Packs/day: 0.50    Types: Cigarettes   Smokeless tobacco: Never  Substance Use Topics   Alcohol use: Yes    Alcohol/week: 3.0 standard drinks    Types: 3 Cans of beer per week    Comment: 3 64 oz can of beer   Drug use: Never   Additional social history:   Please also refer to relevant sections of EMR.  Family History: Family History  Problem Relation Age of Onset   Diabetes Neg Hx    CAD Neg Hx     Allergies and Medications: Allergies  Allergen Reactions   Codeine Rash   No current facility-administered medications on file prior to encounter.   Current Outpatient Medications on File Prior to Encounter  Medication  Sig Dispense Refill   amLODipine (NORVASC) 5 MG tablet Take 1 tablet (5 mg total) by mouth daily. 30 tablet 0   methocarbamol (ROBAXIN) 500 MG tablet Take 1 tablet (500 mg total) by mouth 3 (three) times daily as needed for up to 30 doses. 30 tablet 0   oxyCODONE-acetaminophen (PERCOCET/ROXICET) 5-325 MG tablet Take 1-2 tablets by mouth every 6 (six) hours as needed for severe pain. 30 tablet 0   pantoprazole (PROTONIX) 40 MG tablet Take 1 tablet (40 mg total) by mouth daily. 30 tablet 0    Objective: BP 123/85    Pulse (!) 101    Temp 98.1 F (36.7 C) (Oral)    Resp 14    Ht 5\' 8"  (1.727 m)    Wt 82.6 kg    SpO2 98%    BMI 27.69 kg/m  Exam: General:Awake, well appearing, NAD HEENT: Atraumatic, dry mucous membrane, No sclera icterus CV: RRR, no murmurs, normal S1/S2 Pulm: CTAB, good WOB on RA, no  crackles or wheezing Abd: Soft, no distension, no tenderness Skin: dry, warm Ext: No BLE edema, +2 Pedal and radial pulse, LUE erythematous. Neuro: Alert, oriented x3, profound dysarthria, facial droop, left tongue deviation,LUE 3/5 muscle strength and 5/5 muscle strength on RUE/RLE, LLE. Psych: Normal Affect   Labs and Imaging: CBC BMET  Recent Labs  Lab 08/13/21 1830 08/13/21 1839  WBC 15.3*  --   HGB 16.6 17.7*  HCT 49.7 52.0  PLT 330  --    Recent Labs  Lab 08/13/21 1830 08/13/21 1839  NA 132* 134*  K 3.9 4.0  CL 94* 97*  CO2 26  --   BUN 37* 39*  CREATININE 1.28* 1.20  GLUCOSE 102* 100*  CALCIUM 9.2  --      CT ANGIO HEAD NECK W WO CM  Result Date: 08/13/2021 CLINICAL DATA:  Initial evaluation for neuro deficit, stroke. EXAM: CT ANGIOGRAPHY HEAD AND NECK TECHNIQUE: Multidetector CT imaging of the head and neck was performed using the standard protocol during bolus administration of intravenous contrast. Multiplanar CT image reconstructions and MIPs were obtained to evaluate the vascular anatomy. Carotid stenosis measurements (when applicable) are obtained utilizing NASCET criteria, using the distal internal carotid diameter as the denominator. RADIATION DOSE REDUCTION: This exam was performed according to the departmental dose-optimization program which includes automated exposure control, adjustment of the mA and/or kV according to patient size and/or use of iterative reconstruction technique. CONTRAST:  73mL OMNIPAQUE IOHEXOL 350 MG/ML SOLN COMPARISON:  Head CT from earlier the same day. FINDINGS: CTA NECK FINDINGS Aortic arch: Visualized aortic arch normal caliber with normal branch pattern. No stenosis about the origin the great vessels. Mild aortic atherosclerosis noted. Right carotid system: Right CCA patent without stenosis or dissection. Mixed plaque about the right carotid bulb/proximal right ICA without hemodynamically significant stenosis. Right ICA patent distally  without stenosis or dissection. Left carotid system: Left common and internal carotid arteries patent without stenosis or dissection. Mixed plaque about the left carotid bulb/proximal left ICA without significant stenosis. Vertebral arteries: Both vertebral arteries arise from the subclavian arteries. Vertebral arteries patent without stenosis or evidence for dissection. Skeleton: No discrete or worrisome osseous lesions. Other neck: No other acute soft tissue abnormality within the neck. Upper chest: Visualized upper chest demonstrates no acute finding. Review of the MIP images confirms the above findings CTA HEAD FINDINGS Anterior circulation: Petrous segments patent bilaterally. Atheromatous change throughout the carotid siphons without hemodynamically significant stenosis. A1 segments patent bilaterally. Normal  anterior communicating artery complex. Anterior cerebral arteries patent without stenosis. No M1 stenosis or occlusion. Normal MCA bifurcations. Distal MCA branches perfused and symmetric. Posterior circulation: Atheromatous plaque within the proximal V4 segments bilaterally with no more than mild stenosis. Vertebral arteries otherwise widely patent. Both PICA patent. Basilar patent to its distal aspect without stenosis. Superior cerebral arteries patent bilaterally. Right PCA supplied via the basilar. Left PCA supplied via a hypoplastic left P1 segment and robust left posterior communicating artery. Both PCAs well perfused or distal aspects. Venous sinuses: Not well assessed due to timing the contrast bolus. Anatomic variants: None significant. No aneurysm. No vascular abnormality seen underlying the right basal ganglia hemorrhage. Review of the MIP images confirms the above findings IMPRESSION: 1. Negative CTA for large vessel occlusion. 2. Atheromatous disease about the carotid bifurcations and carotid siphons without hemodynamically significant or correctable stenosis. 3. No vascular abnormality seen  underlying the right basal ganglia hemorrhage. Aortic Atherosclerosis (ICD10-I70.0). Electronically Signed   By: Rise MuBenjamin  McClintock M.D.   On: 08/13/2021 19:59   CT Head Wo Contrast  Result Date: 08/13/2021 CLINICAL DATA:  Acute stroke suspected, found down EXAM: CT HEAD WITHOUT CONTRAST TECHNIQUE: Contiguous axial images were obtained from the base of the skull through the vertex without intravenous contrast. RADIATION DOSE REDUCTION: This exam was performed according to the departmental dose-optimization program which includes automated exposure control, adjustment of the mA and/or kV according to patient size and/or use of iterative reconstruction technique. COMPARISON:  None. FINDINGS: Brain: No evidence of hydrocephalus, extra-axial collection or mass. Large intraparenchymal hemorrhage centered within the right lentiform nuclei, measuring 4.7 x 2.2 cm (series 4, image 16). There is mass effect on the right lateral ventricle with 0.4 cm right to left midline shift. Underlying periventricular and deep white matter hypodensity. Focal encephalomalacia of the anterior left frontal lobe (series 4, image 20). Vascular: No hyperdense vessel or unexpected calcification. Skull: Normal. Negative for fracture or focal lesion. Sinuses/Orbits: No acute finding. Other: None. IMPRESSION: 1. Large intraparenchymal hemorrhage centered within the right lentiform nuclei, measuring 4.7 x 2.2 cm. There is mass effect on the right lateral ventricle with 0.4 cm right to left midline shift. 2. Underlying small-vessel white matter disease. 3. Focal encephalomalacia of the anterior left frontal lobe, in keeping with prior infarction. These results were called by telephone at the time of interpretation on 08/13/2021 at 7:42 pm to Dr. Glendora ScoreMADISON KOMMOR , who verbally acknowledged these results. Electronically Signed   By: Jearld LeschAlex D Bibbey M.D.   On: 08/13/2021 19:43      EKG: SR with no ST changes.   Jerre SimonNorbert, Janella Rogala, MD 08/13/2021, 8:17  PM PGY-1, [facility] FPTS Intern pager: [phone], text pages welcome   FPTS Upper-Level Resident Addendum   I have independently interviewed and examined the patient. I have discussed the above with the original author and agree with their documentation. Please see also any attending notes.   Derrel NipVictor Bridney Guadarrama, MD PGY-3, [facility] 08/13/2021 11:00 PM  FPTS Service pager: [phone] (text pages welcome through [employer])

## 2021-08-14 ENCOUNTER — Inpatient Hospital Stay (HOSPITAL_COMMUNITY): Payer: Medicaid Other

## 2021-08-14 DIAGNOSIS — I10 Essential (primary) hypertension: Secondary | ICD-10-CM

## 2021-08-14 DIAGNOSIS — I610 Nontraumatic intracerebral hemorrhage in hemisphere, subcortical: Principal | ICD-10-CM

## 2021-08-14 DIAGNOSIS — Z789 Other specified health status: Secondary | ICD-10-CM

## 2021-08-14 LAB — BASIC METABOLIC PANEL
Anion gap: 10 (ref 5–15)
BUN: 34 mg/dL — ABNORMAL HIGH (ref 8–23)
CO2: 23 mmol/L (ref 22–32)
Calcium: 8.6 mg/dL — ABNORMAL LOW (ref 8.9–10.3)
Chloride: 100 mmol/L (ref 98–111)
Creatinine, Ser: 1.28 mg/dL — ABNORMAL HIGH (ref 0.61–1.24)
GFR, Estimated: >60 mL/min (ref >60)
Glucose, Bld: 120 mg/dL — ABNORMAL HIGH (ref 70–99)
Potassium: 4 mmol/L (ref 3.5–5.1)
Sodium: 133 mmol/L — ABNORMAL LOW (ref 135–145)

## 2021-08-14 LAB — TSH: TSH: 0.884 u[IU]/mL (ref 0.350–4.500)

## 2021-08-14 LAB — CBC
HCT: 46.6 % (ref 39.0–52.0)
Hemoglobin: 16.2 g/dL (ref 13.0–17.0)
MCH: 29.1 pg (ref 26.0–34.0)
MCHC: 34.8 g/dL (ref 30.0–36.0)
MCV: 83.7 fL (ref 80.0–100.0)
Platelets: 283 10*3/uL (ref 150–400)
RBC: 5.57 MIL/uL (ref 4.22–5.81)
RDW: 12.4 % (ref 11.5–15.5)
WBC: 13.6 10*3/uL — ABNORMAL HIGH (ref 4.0–10.5)
nRBC: 0 % (ref 0.0–0.2)

## 2021-08-14 LAB — LIPID PANEL
Cholesterol: 214 mg/dL — ABNORMAL HIGH (ref 0–200)
HDL: 28 mg/dL — ABNORMAL LOW (ref >40)
LDL Cholesterol: 159 mg/dL — ABNORMAL HIGH (ref 0–99)
Total CHOL/HDL Ratio: 7.6 RATIO
Triglycerides: 134 mg/dL (ref <150)
VLDL: 27 mg/dL (ref 0–40)

## 2021-08-14 LAB — RAPID URINE DRUG SCREEN, HOSP PERFORMED
Amphetamines: NOT DETECTED
Barbiturates: NOT DETECTED
Benzodiazepines: NOT DETECTED
Cocaine: NOT DETECTED
Opiates: NOT DETECTED
Tetrahydrocannabinol: POSITIVE — AB

## 2021-08-14 LAB — PHOSPHORUS: Phosphorus: 3.6 mg/dL (ref 2.5–4.6)

## 2021-08-14 LAB — ECHOCARDIOGRAM COMPLETE
AV Mean grad: 13 mmHg
AV Peak grad: 23.2 mmHg
Ao pk vel: 2.41 m/s
Area-P 1/2: 4.01 cm2
Height: 68 in
S' Lateral: 2.9 cm
Weight: 2913.6 oz

## 2021-08-14 LAB — HEMOGLOBIN A1C
Hgb A1c MFr Bld: 6 % — ABNORMAL HIGH (ref 4.8–5.6)
Mean Plasma Glucose: 126 mg/dL

## 2021-08-14 LAB — HIV ANTIBODY (ROUTINE TESTING W REFLEX): HIV Screen 4th Generation wRfx: NONREACTIVE

## 2021-08-14 LAB — MAGNESIUM: Magnesium: 2.3 mg/dL (ref 1.7–2.4)

## 2021-08-14 IMAGING — MR MR HEAD W/O CM
13 of 14 series · 44 of 48 positions shown · non-contrast
Comparison: CT head and CTA head and neck [DATE].

CLINICAL DATA: 60-year-old male with left side weakness, right
basal ganglia hemorrhage. Found down.

EXAM:
MRI HEAD WITHOUT CONTRAST
TECHNIQUE: Multiplanar, multiecho pulse sequences of the brain and surrounding
structures were obtained without intravenous contrast.

[Series 5: DWI · axial · 3.0mm · 0.88mm/px · z∈[-95,+52]mm · 7 of 100 slices shown (1 of 4)]
[im 1/100]
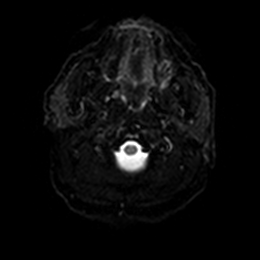
[im 17/100]
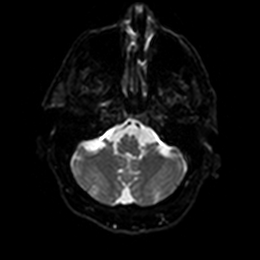
[im 34/100]
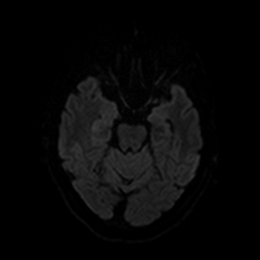
[im 50/100]
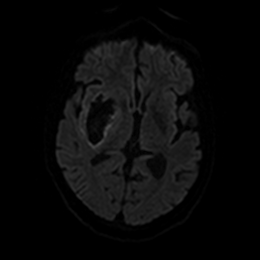
[im 67/100]
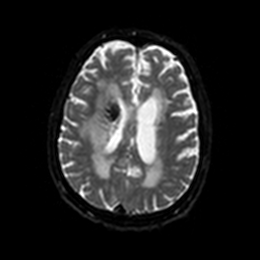
[im 83/100]
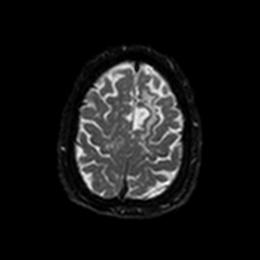
[im 100/100]
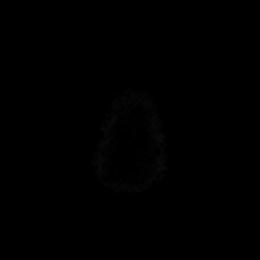

[Series 6: DWI · axial · 3.0mm · 0.88mm/px · z∈[-95,+52]mm · 4 of 50 slices shown (2 of 4)]
[im 1/50]
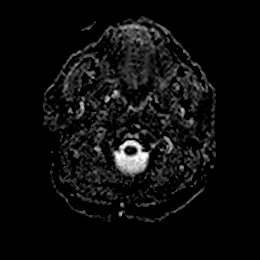
[im 17/50]
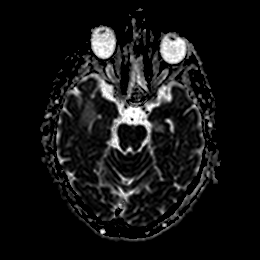
[im 33/50]
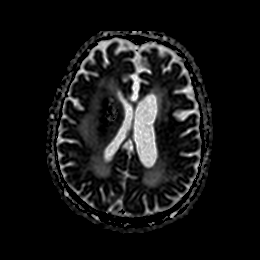
[im 50/50]
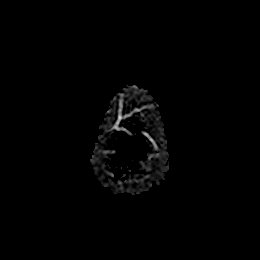

[Series 7: DWI · coronal · 4.0mm · 0.88mm/px · 5 of 64 slices shown (3 of 4)]
[im 1/64]
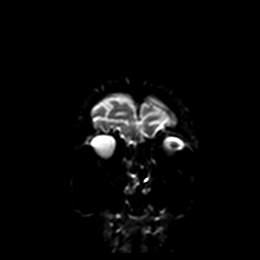
[im 16/64]
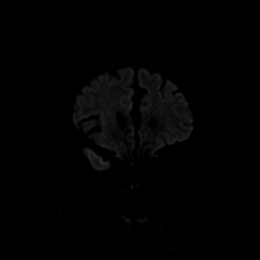
[im 32/64]
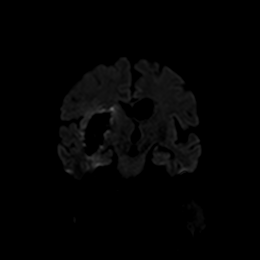
[im 48/64]
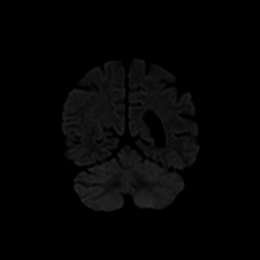
[im 64/64]
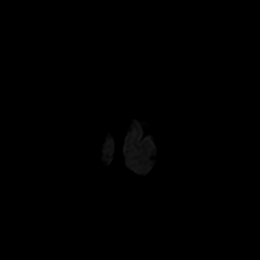

[Series 8: DWI · coronal · 4.0mm · 0.88mm/px · 2 of 32 slices shown (4 of 4)]
[im 1/32]
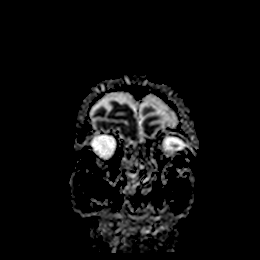
[im 32/32]
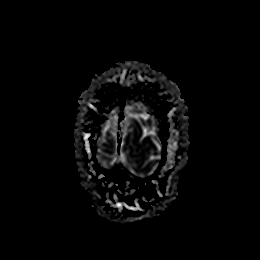

[Series 9: T1 · sagittal · 5.0mm · 0.75mm/px · 2 of 23 slices shown]
[im 1/23]
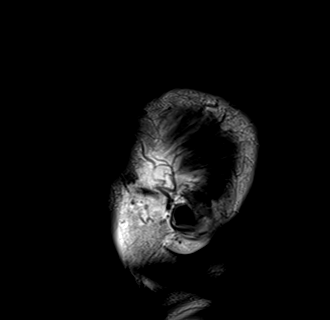
[im 23/23]
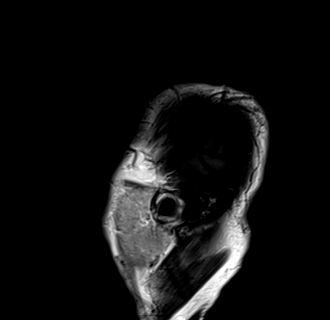

[Series 10: T2 · axial · 5.0mm · 0.72mm/px · z∈[-93,+50]mm · 2 of 25 slices shown (1 of 3)]
[im 1/25]
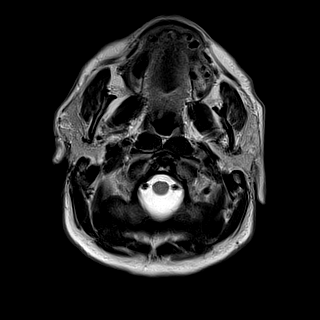
[im 25/25]
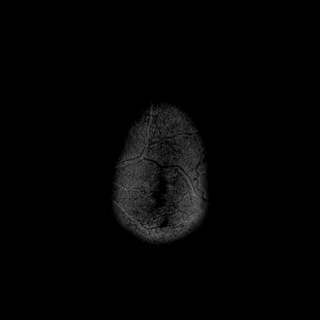

[Series 11: FLAIR · axial · 5.0mm · 0.45mm/px · z∈[-93,+50]mm · 2 of 25 slices shown]
[im 1/25]
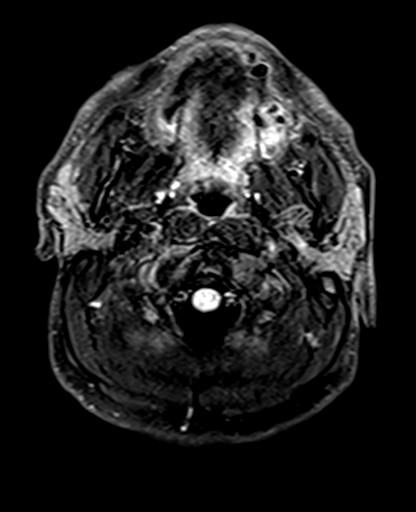
[im 25/25]
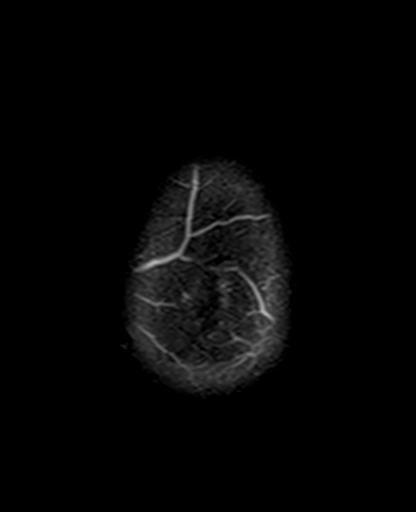

[Series 12: mag_images · axial · 3.0mm · 0.90mm/px · z∈[-110,+66]mm · 4 of 60 slices shown]
[im 1/60]
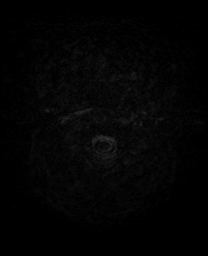
[im 20/60]
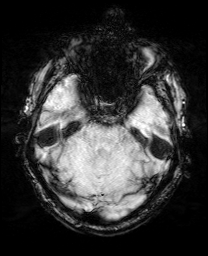
[im 40/60]
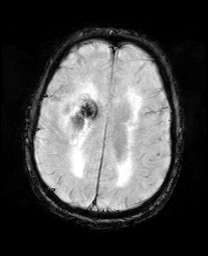
[im 60/60]
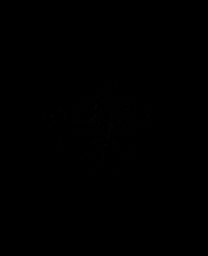

[Series 13: pha_images · axial · 3.0mm · 0.90mm/px · z∈[-110,+63]mm · 4 of 58 slices shown]
[im 1/58]
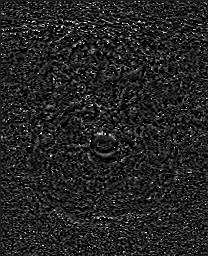
[im 20/58]
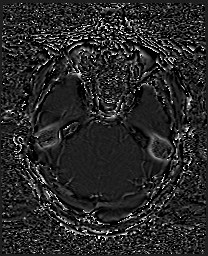
[im 39/58]
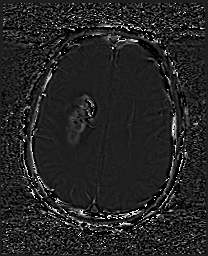
[im 58/58]
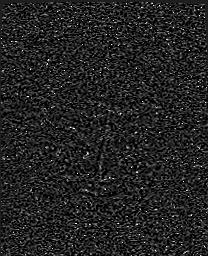

[Series 14: swi_images · axial · 3.0mm · 0.90mm/px · z∈[-110,+66]mm · 4 of 60 slices shown]
[im 1/60]
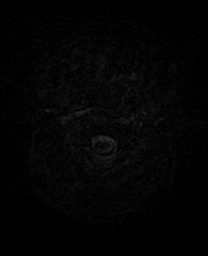
[im 20/60]
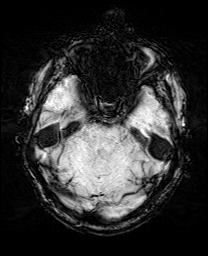
[im 40/60]
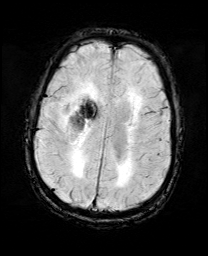
[im 60/60]
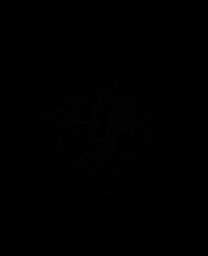

[Series 15: mip_images(sw) · axial · 24.0mm · 0.90mm/px · z∈[-99,+56]mm · 4 of 53 slices shown]
[im 1/53]
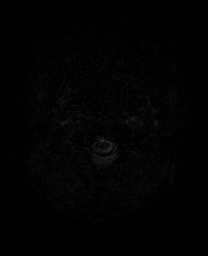
[im 18/53]
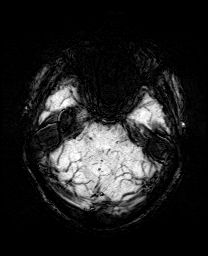
[im 35/53]
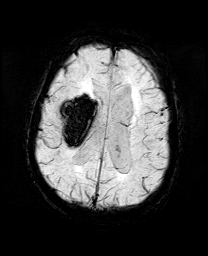
[im 53/53]
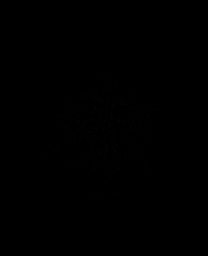

[Series 17: T2 · coronal · 5.0mm · 0.34mm/px · 2 of 29 slices shown (2 of 3)]
[im 1/29]
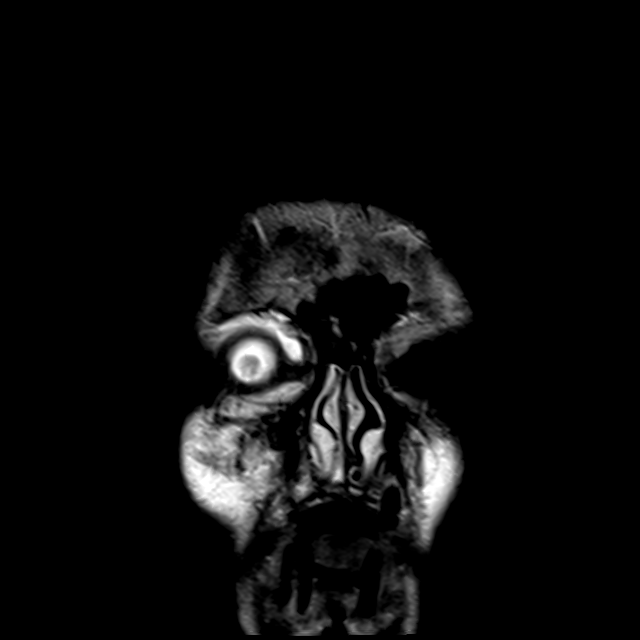
[im 29/29]
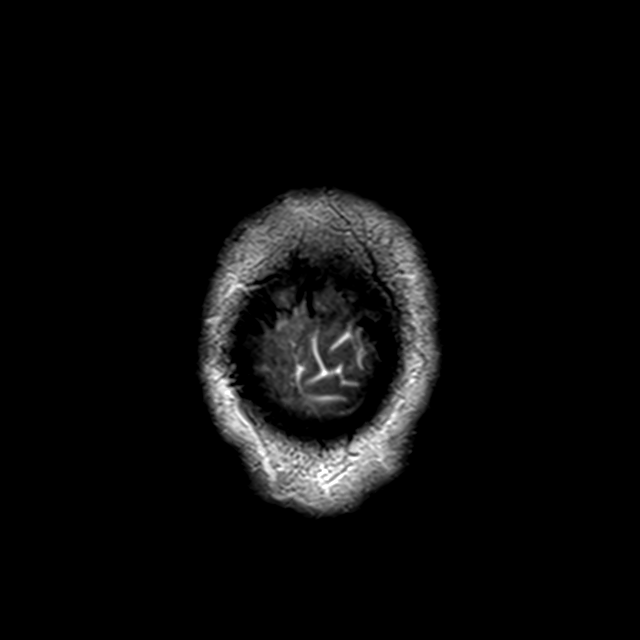

[Series 18: T2 · coronal · 5.0mm · 0.34mm/px · 2 of 29 slices shown (3 of 3)]
[im 1/29]
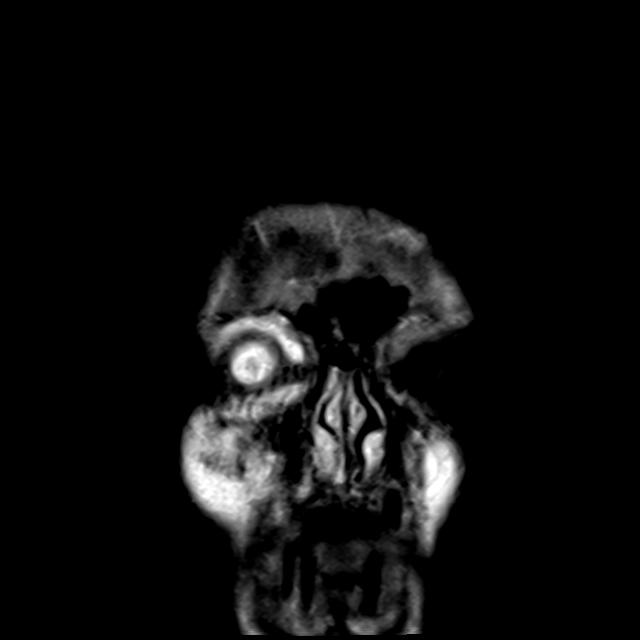
[im 29/29]
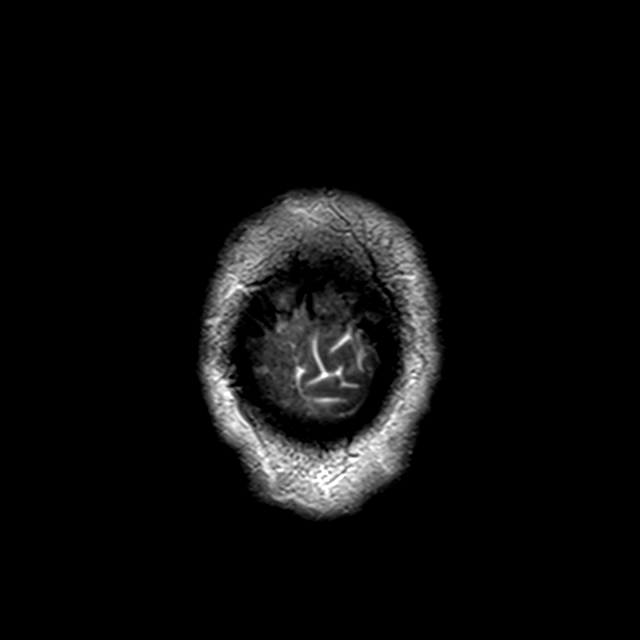

[44 of 48 positions shown; findings below may reference images not displayed]

FINDINGS: Brain: Biconvex T2 dark, mixed T1 signal intensity intra-axial
hemorrhage in the right basal ganglia encompasses 45 by 24 x 53 mm
(AP by transverse by CC) for an estimated blood volume of 29 mL.
Surrounding edema. Regional mass effect including on the right
lateral ventricle. Mild leftward midline shift of 4 mm.

No intraventricular hemorrhage. No ventriculomegaly. No
transependymal edema. Basilar cisterns remain patent.

Susceptibility artifact from the acute blood products on DWI, plus
there is a punctate focus of probable T2 shine through in the right
centrum semiovale (series 5, image 90) with abundant underlying
cerebral white matter T2 and FLAIR hyperintensity there, and in the
contralateral left hemisphere where chronic white matter lacunar
infarcts are noted. There are chronic lacunar infarcts also in both
thalami, the left basal ganglia, and occasionally the cerebellum.

No chronic hemorrhage identified in the brain. No cortical
encephalomalacia identified. Cervicomedullary junction and pituitary
are within normal limits.

Vascular: Major intracranial vascular flow voids are preserved.

Skull and upper cervical spine: Visualized bone marrow signal is
within normal limits. Negative for age visible cervical spine.

Sinuses/Orbits: Negative.

Other: Grossly normal visible internal auditory structures. Negative
visible scalp and face.
IMPRESSION: 1. Biconvex intra-axial hemorrhage in the right basal ganglia with
estimated blood volume of 29 mL. Surrounding edema and regional mass
effect including mild leftward midline shift of 4 mm. No
intraventricular extension. No ventriculomegaly or other
complicating features.

2. Underlying advanced chronic small vessel disease.

## 2021-08-14 IMAGING — CT CT HEAD W/O CM
3 series · 15 of 47 positions shown, 18 images · non-contrast
Comparison: Head CT/CTA [DATE] and head MRI [DATE]

CLINICAL DATA: Hemorrhagic stroke follow-up.



[Series 4: head 5.0 h30s · axial · 0.46mm/px · z∈[-89,+46]mm · 9 of 33 slices shown, 12 images]
[im 3/33  brain]
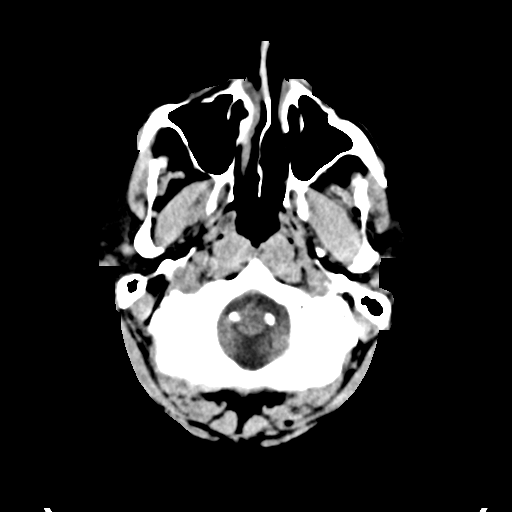
[im 3/33  bone]
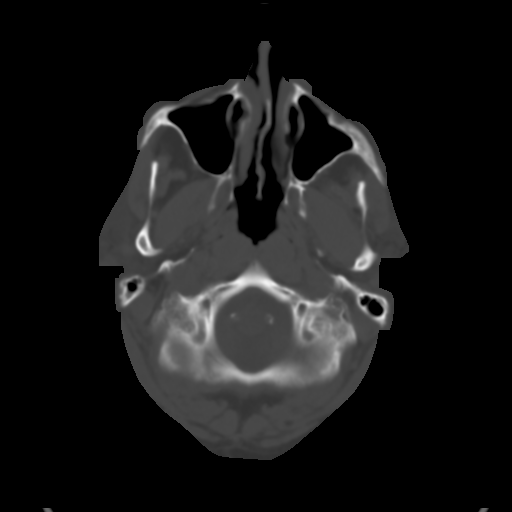
[im 6/33  brain]
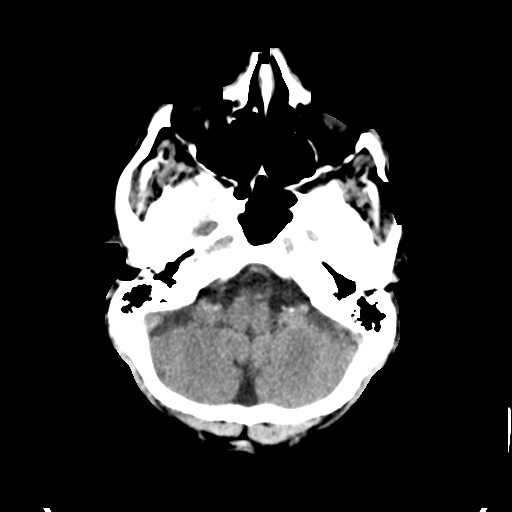
[im 9/33  brain]
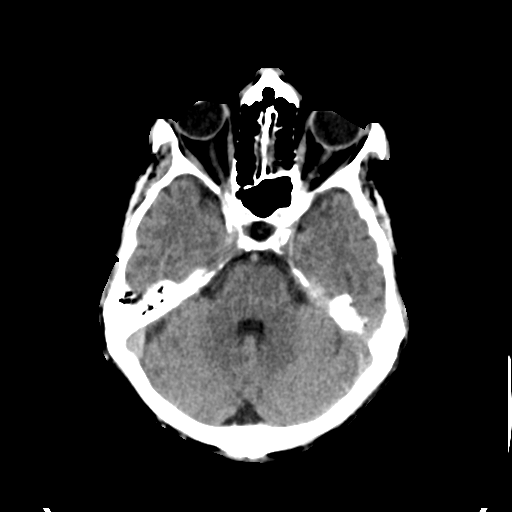
[im 13/33  brain]
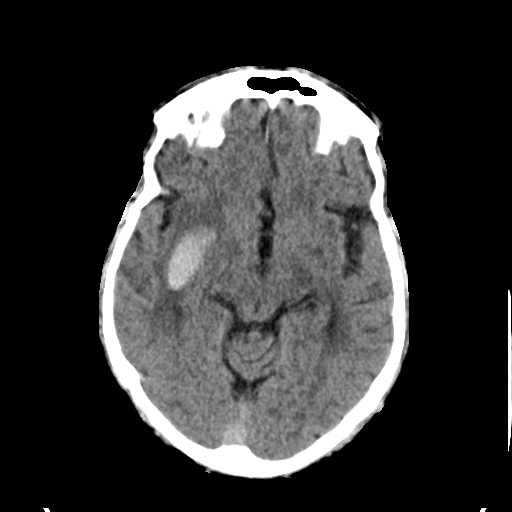
[im 17/33  brain]
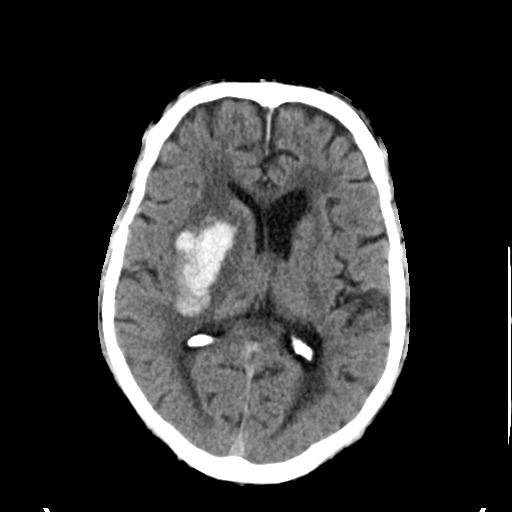
[im 17/33  bone]
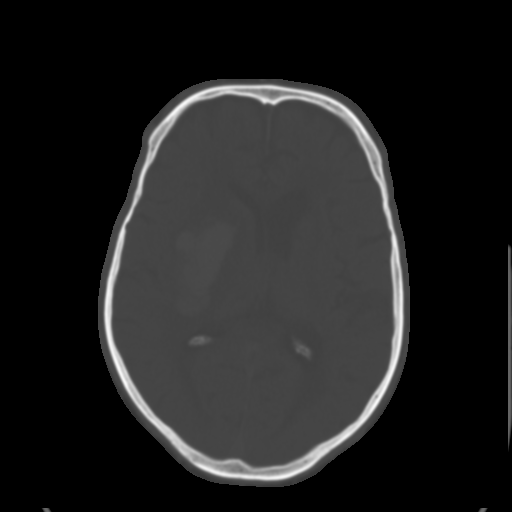
[im 20/33  brain]
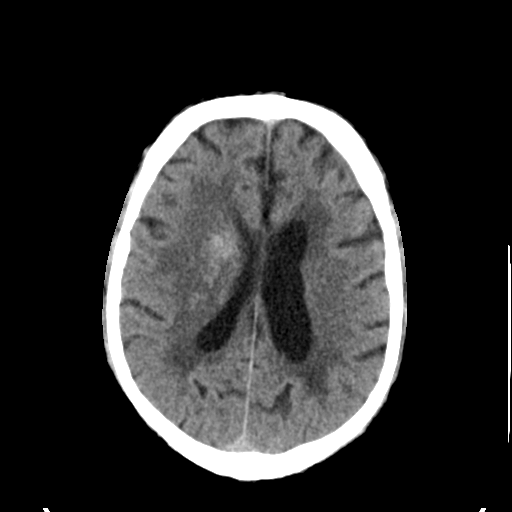
[im 24/33  brain]
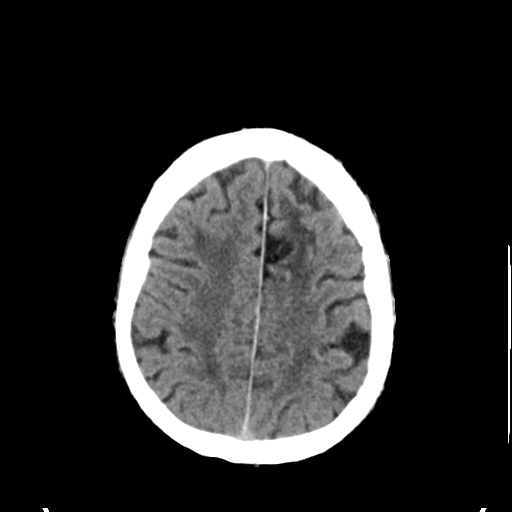
[im 27/33  brain]
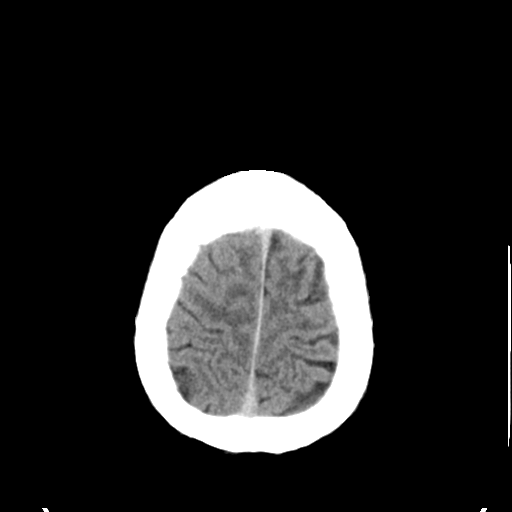
[im 30/33  brain]
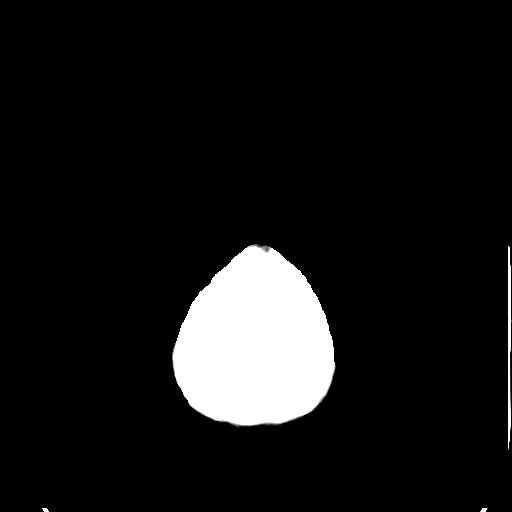
[im 30/33  bone]
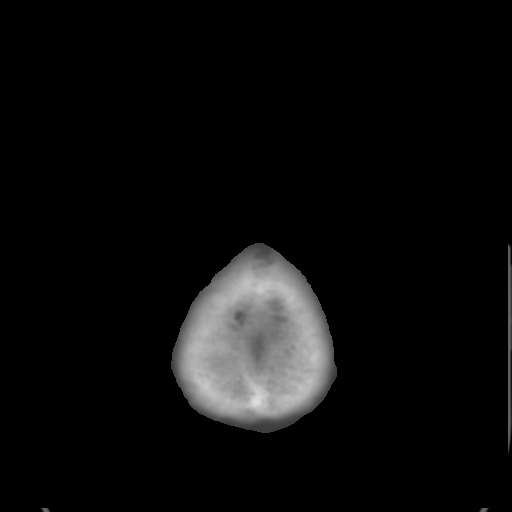

[Series 5: head 3.0 mpr cor · coronal · 0.31mm/px · 3 of 71 slices shown]
[im 24/71  brain]
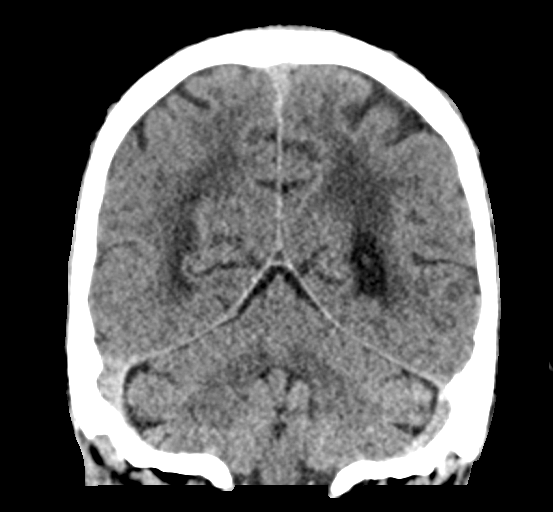
[im 32/71  brain]
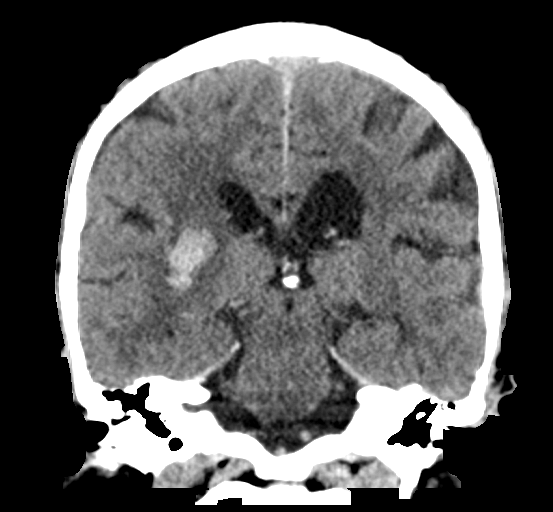
[im 39/71  brain]
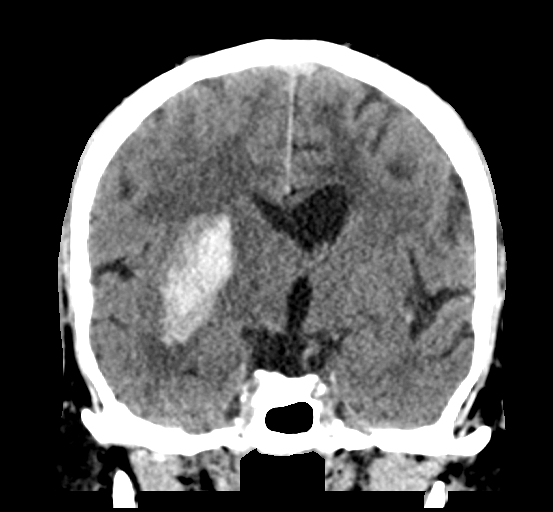

[Series 6: head 3.0 mpr sag · sagittal · 0.33mm/px · 3 of 55 slices shown]
[im 19/55  brain]
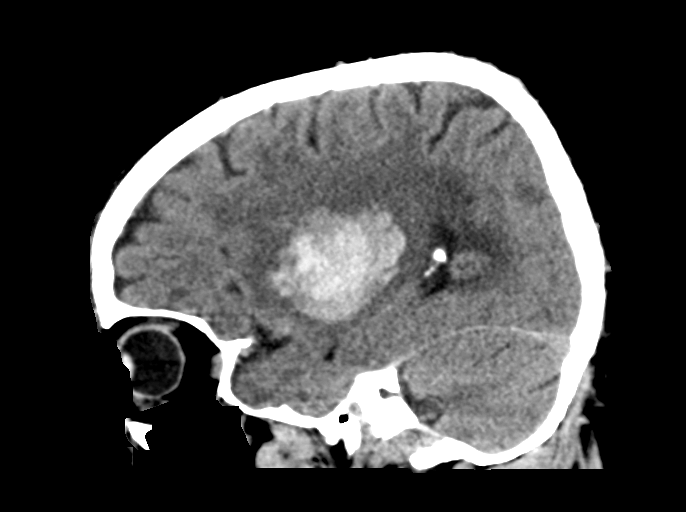
[im 28/55  brain]
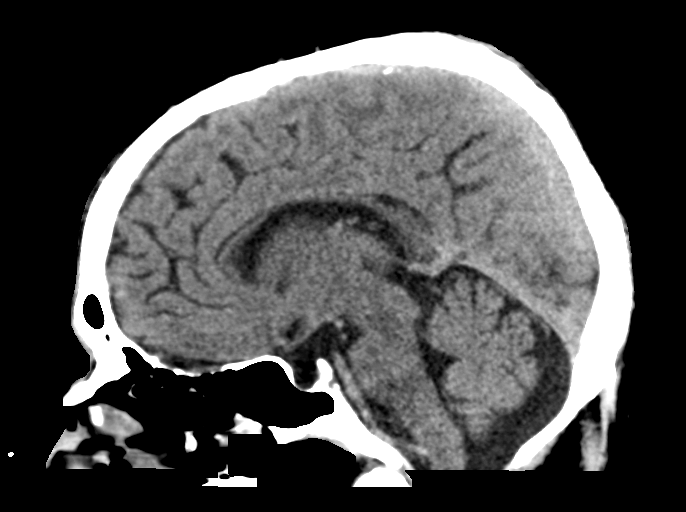
[im 37/55  brain]
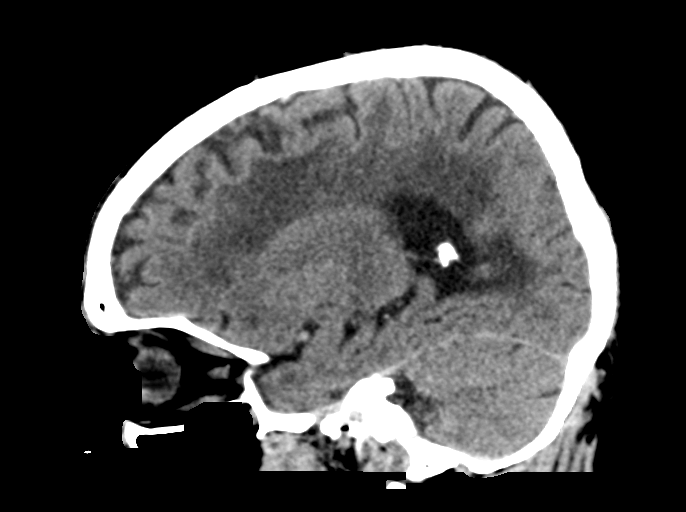

[15 of 47 positions shown; findings below may reference images not displayed]

FINDINGS: Brain: A 4.7 x 2.3 x 3.8 cm parenchymal hemorrhage centered in the
right lentiform nucleus is unchanged in size, as are surrounding
edema and 4 mm of leftward midline shift. No new intracranial
hemorrhage, acute cortically based infarct, or extra-axial fluid
collection is identified. Confluent hypodensities in the cerebral
white matter bilaterally are unchanged and nonspecific but
compatible with severe chronic small vessel ischemic disease. A
small chronic left frontal cortical infarct and chronic bilateral
thalamic and right cerebellar lacunar infarcts are again noted.
There is mild cerebral atrophy.

Vascular: Calcified atherosclerosis at the skull base. No hyperdense
vessel.

Skull: No acute fracture or suspicious osseous lesion.

Sinuses/Orbits: Visualized paranasal sinuses and mastoid air cells
are clear. Unremarkable orbits.

Other: None.
IMPRESSION: 1. Unchanged right basal ganglia hemorrhage and 4 mm of leftward
midline shift.
2. No new intracranial abnormality.
3. Severe chronic small vessel ischemic disease.

## 2021-08-14 MED ORDER — ROSUVASTATIN CALCIUM 20 MG PO TABS: 20.0000 mg | ORAL_TABLET | Freq: Every day | ORAL | Status: DC

## 2021-08-14 NOTE — Research (Signed)
Patient qualified for [facility] Research Study to evaluate manual BP recordings. Patient made aware of the study and patient information sheet given at the bedside. No questions reported and BPs obtained with Scarlette Slice, RN.   Donny Pique, RN BSN  Stroke Response Coordinator

## 2021-08-14 NOTE — Evaluation (Signed)
Clinical/Bedside Swallow Evaluation Patient Details  Name: Jerry Humphrey MRN: [id number] Date of Birth: Jun 19, 1961  Today's Date: 08/14/2021 Time: SLP Start Time (ACUTE ONLY): 3734 SLP Stop Time (ACUTE ONLY): 1008 SLP Time Calculation (min) (ACUTE ONLY): 15 min  Past Medical History:  Past Medical History:  Diagnosis Date   ETOH abuse    Hypertension    Seizures (HCC)    Past Surgical History:  Past Surgical History:  Procedure Laterality Date   OPEN REDUCTION INTERNAL FIXATION (ORIF) TIBIA/FIBULA FRACTURE Right 02/01/2019   Procedure: OPEN REDUCTION INTERNAL FIXATION (ORIF) TIBIA/FIBULA FRACTURE;  Surgeon: Beverely Low, MD;  Location: [facility];  Service: Orthopedics;  Laterality: Right;   HPI:  60 yo with PMHx of ETOH and Tobacco abuse, HTN, Alcohol withdrawal Seizures presented with left-sided weakness. He endorses a slight Headache this morning with a reduced sensation in his left extremities. MRI Biconvex intra-axial hemorrhage in the right basal ganglia with estimated blood volume of 29 mL. Surrounding edema and regional mass effect including mild leftward midline shift of 4 mm.    Assessment / Plan / Recommendation  Clinical Impression  Pt demonstrates mild oropharyngeal dysphagia marked by immediate cough following approximately 3 oz water and separate trial with straw. Strategies implemented for small cup sips over trials with completion of applesauce, water and pack graham crackers. Oral-motor impairments characterized by reduced motion and sensation, mild-mod dysarthria. Intermittent wet vocal quality. He is missing over half dentition and therapist recommends initiating Dys 3 texture, thin with cup (no straws) and pills whole in puree. ST will continue to follow. SLP Visit Diagnosis: Dysphagia, oropharyngeal phase (R13.12)    Aspiration Risk  Moderate aspiration risk    Diet Recommendation Dysphagia 3 (Mech soft);Thin liquid   Liquid Administration via: Cup;No  straw Medication Administration: Whole meds with puree Supervision: Staff to assist with self feeding Compensations: Slow rate;Small sips/bites;Lingual sweep for clearance of pocketing Postural Changes: Seated upright at 90 degrees    Other  Recommendations Oral Care Recommendations: Oral care BID    Recommendations for follow up therapy are one component of a multi-disciplinary discharge planning process, led by the attending physician.  Recommendations may be updated based on patient status, additional functional criteria and insurance authorization.  Follow up Recommendations No SLP follow up      Assistance Recommended at Discharge Intermittent Supervision/Assistance  Functional Status Assessment Patient has had a recent decline in their functional status and demonstrates the ability to make significant improvements in function in a reasonable and predictable amount of time.  Frequency and Duration min 2x/week  2 weeks       Prognosis Prognosis for Safe Diet Advancement: Good      Swallow Study   General Date of Onset: 08/13/21 HPI: 61 yo with PMHx of ETOH and Tobacco abuse, HTN, Alcohol withdrawal Seizures presented with left-sided weakness. He endorses a slight Headache this morning with a reduced sensation in his left extremities. MRI Biconvex intra-axial hemorrhage in the right basal ganglia with estimated blood volume of 29 mL. Surrounding edema and regional mass effect including mild leftward midline shift of 4 mm. Type of Study: Bedside Swallow Evaluation Previous Swallow Assessment: none Diet Prior to this Study: NPO Temperature Spikes Noted: No Respiratory Status: Room air History of Recent Intubation: No Behavior/Cognition: Alert;Cooperative;Pleasant mood Oral Cavity Assessment: Within Functional Limits Oral Care Completed by SLP: No Oral Cavity - Dentition: Missing dentition Vision: Functional for self-feeding Self-Feeding Abilities: Needs assist;Needs set  up Patient Positioning: Upright in bed Baseline  Vocal Quality: Normal Volitional Cough: Strong Volitional Swallow: Able to elicit    Oral/Motor/Sensory Function Overall Oral Motor/Sensory Function: Moderate impairment Facial ROM: Reduced left;Suspected CN VII (facial) dysfunction Facial Symmetry: Abnormal symmetry left;Suspected CN VII (facial) dysfunction Facial Strength: Reduced left;Suspected CN VII (facial) dysfunction Facial Sensation: Reduced left;Suspected CN V (Trigeminal) dysfunction Lingual ROM: Reduced right;Suspected CN XII (hypoglossal) dysfunction Lingual Symmetry: Abnormal symmetry left;Suspected CN XII (hypoglossal) dysfunction Mandible: Within Functional Limits   Ice Chips Ice chips: Not tested   Thin Liquid Thin Liquid: Impaired Presentation: Cup;Straw Pharyngeal  Phase Impairments: Cough - Immediate;Wet Vocal Quality    Nectar Thick Nectar Thick Liquid: Not tested   Honey Thick Honey Thick Liquid: Not tested   Puree Puree: Impaired Oral Phase Functional Implications: Oral residue (left labial)   Solid     Solid: Impaired Oral Phase Functional Implications: Oral residue      Royce Macadamia 08/14/2021,10:28 AM  Breck Coons Lonell Face.Ed Nurse, children's [phone] Office [phone]

## 2021-08-14 NOTE — Progress Notes (Addendum)
STROKE TEAM PROGRESS NOTE   ATTENDING NOTE: I reviewed above note and agree with the assessment and plan. Pt was seen and examined.   61 year old male with history of hypertension, alcohol withdrawal seizure, smoker, noncompliant with medication admitted for left-sided weakness and headache.  CT showed right BG ICH with surrounding edema and mild midline shift.  History has been going on for 3-4 days.  MRI shows stable hematoma.  CT repeat this afternoon again showed stable hematoma and mass-effect.  CT head and neck no aneurysm or AVM.  EF 60 to 65%, LDL 159, A1c pending.  UDS positive for THC.  Creatinine 1.20->1.28.  Sodium 133, WBC 15.3->13.6.  On exam, patient lethargic, however follows simple commands and answer questions appropriately.  However moderate to severe dysarthria, no aphasia follows simple commands.  Able to name and repeat.  Visual fields full, no gaze palsy, left facial droop.  Left upper extremity proximal 0/5, bicep 2/5, tricep 0/5, finger grip 2+/5.  Left lower extremity 4/5 proximal and 5/5 distal.  Right upper and lower extremity 5/5.  Sensation decreased on the left.  Right finger-to-nose intact.  Gait not tested.  Etiology for patient right BG ICH likely due to hypertension, smoking and alcohol abuse, however patient BP stable so far without IV medication.  Need to repeat MRI, CTA head and neck once hematoma resolves as outpatient.  Repeat neuroimaging stable and patient neuro stable, no need IV drip, okay to stay on the floor at this time.  BP goal less than 160.  PT/OT recommend SNF.  We will follow.  For detailed assessment and plan, please refer to above as I have made changes wherever appropriate.   Marvel PlanJindong Justyn Langham, MD PhD Stroke Neurology 08/14/2021 7:32 PM    INTERVAL HISTORY Patient was seen and assessed at the bedside.  Patient reports to having been drinking Friday evening, took a nap, and woke up with left sided weakness and dysarthria. After these symptoms  persisted, brother called ambulance for patient to hospital last evening. Patient reports slight improvement but left sided weakness and dysarthria still persist.   Vitals:   08/14/21 0015 08/14/21 0200 08/14/21 0530 08/14/21 0700  BP:  121/78 (!) 145/87 102/73  Pulse: 98 (!) 101 (!) 102 86  Resp: 20 18 (!) 22 14  Temp:      TempSrc:      SpO2: 96% 97% 95% 97%  Weight:      Height:       CBC:  Recent Labs  Lab 08/13/21 1830 08/13/21 1839 08/14/21 0435  WBC 15.3*  --  13.6*  NEUTROABS 10.3*  --   --   HGB 16.6 17.7* 16.2  HCT 49.7 52.0 46.6  MCV 85.2  --  83.7  PLT 330  --  283   Basic Metabolic Panel:  Recent Labs  Lab 08/13/21 1830 08/13/21 1839 08/14/21 0435  NA 132* 134* 133*  K 3.9 4.0 4.0  CL 94* 97* 100  CO2 26  --  23  GLUCOSE 102* 100* 120*  BUN 37* 39* 34*  CREATININE 1.28* 1.20 1.28*  CALCIUM 9.2  --  8.6*  MG  --   --  2.3  PHOS  --   --  3.6   Lipid Panel:  Recent Labs  Lab 08/14/21 0435  CHOL 214*  TRIG 134  HDL 28*  CHOLHDL 7.6  VLDL 27  LDLCALC 098159*   HgbA1c: No results for input(s): HGBA1C in the last 168 hours. Urine Drug Screen:  No results for input(s): LABOPIA, COCAINSCRNUR, LABBENZ, AMPHETMU, THCU, LABBARB in the last 168 hours.  Alcohol Level No results for input(s): ETH in the last 168 hours.  IMAGING past 24 hours CT ANGIO HEAD NECK W WO CM  Result Date: 08/13/2021 CLINICAL DATA:  Initial evaluation for neuro deficit, stroke. EXAM: CT ANGIOGRAPHY HEAD AND NECK TECHNIQUE: Multidetector CT imaging of the head and neck was performed using the standard protocol during bolus administration of intravenous contrast. Multiplanar CT image reconstructions and MIPs were obtained to evaluate the vascular anatomy. Carotid stenosis measurements (when applicable) are obtained utilizing NASCET criteria, using the distal internal carotid diameter as the denominator. RADIATION DOSE REDUCTION: This exam was performed according to the departmental  dose-optimization program which includes automated exposure control, adjustment of the mA and/or kV according to patient size and/or use of iterative reconstruction technique. CONTRAST:  39mL OMNIPAQUE IOHEXOL 350 MG/ML SOLN COMPARISON:  Head CT from earlier the same day. FINDINGS: CTA NECK FINDINGS Aortic arch: Visualized aortic arch normal caliber with normal branch pattern. No stenosis about the origin the great vessels. Mild aortic atherosclerosis noted. Right carotid system: Right CCA patent without stenosis or dissection. Mixed plaque about the right carotid bulb/proximal right ICA without hemodynamically significant stenosis. Right ICA patent distally without stenosis or dissection. Left carotid system: Left common and internal carotid arteries patent without stenosis or dissection. Mixed plaque about the left carotid bulb/proximal left ICA without significant stenosis. Vertebral arteries: Both vertebral arteries arise from the subclavian arteries. Vertebral arteries patent without stenosis or evidence for dissection. Skeleton: No discrete or worrisome osseous lesions. Other neck: No other acute soft tissue abnormality within the neck. Upper chest: Visualized upper chest demonstrates no acute finding. Review of the MIP images confirms the above findings CTA HEAD FINDINGS Anterior circulation: Petrous segments patent bilaterally. Atheromatous change throughout the carotid siphons without hemodynamically significant stenosis. A1 segments patent bilaterally. Normal anterior communicating artery complex. Anterior cerebral arteries patent without stenosis. No M1 stenosis or occlusion. Normal MCA bifurcations. Distal MCA branches perfused and symmetric. Posterior circulation: Atheromatous plaque within the proximal V4 segments bilaterally with no more than mild stenosis. Vertebral arteries otherwise widely patent. Both PICA patent. Basilar patent to its distal aspect without stenosis. Superior cerebral arteries  patent bilaterally. Right PCA supplied via the basilar. Left PCA supplied via a hypoplastic left P1 segment and robust left posterior communicating artery. Both PCAs well perfused or distal aspects. Venous sinuses: Not well assessed due to timing the contrast bolus. Anatomic variants: None significant. No aneurysm. No vascular abnormality seen underlying the right basal ganglia hemorrhage. Review of the MIP images confirms the above findings IMPRESSION: 1. Negative CTA for large vessel occlusion. 2. Atheromatous disease about the carotid bifurcations and carotid siphons without hemodynamically significant or correctable stenosis. 3. No vascular abnormality seen underlying the right basal ganglia hemorrhage. Aortic Atherosclerosis (ICD10-I70.0). Electronically Signed   By: Rise Mu M.D.   On: 08/13/2021 19:59   CT Head Wo Contrast  Result Date: 08/13/2021 CLINICAL DATA:  Acute stroke suspected, found down EXAM: CT HEAD WITHOUT CONTRAST TECHNIQUE: Contiguous axial images were obtained from the base of the skull through the vertex without intravenous contrast. RADIATION DOSE REDUCTION: This exam was performed according to the departmental dose-optimization program which includes automated exposure control, adjustment of the mA and/or kV according to patient size and/or use of iterative reconstruction technique. COMPARISON:  None. FINDINGS: Brain: No evidence of hydrocephalus, extra-axial collection or mass. Large intraparenchymal hemorrhage centered within the right  lentiform nuclei, measuring 4.7 x 2.2 cm (series 4, image 16). There is mass effect on the right lateral ventricle with 0.4 cm right to left midline shift. Underlying periventricular and deep white matter hypodensity. Focal encephalomalacia of the anterior left frontal lobe (series 4, image 20). Vascular: No hyperdense vessel or unexpected calcification. Skull: Normal. Negative for fracture or focal lesion. Sinuses/Orbits: No acute finding.  Other: None. IMPRESSION: 1. Large intraparenchymal hemorrhage centered within the right lentiform nuclei, measuring 4.7 x 2.2 cm. There is mass effect on the right lateral ventricle with 0.4 cm right to left midline shift. 2. Underlying small-vessel white matter disease. 3. Focal encephalomalacia of the anterior left frontal lobe, in keeping with prior infarction. These results were called by telephone at the time of interpretation on 08/13/2021 at 7:42 pm to Dr. Glendora ScoreMADISON KOMMOR , who verbally acknowledged these results. Electronically Signed   By: Jearld LeschAlex D Bibbey M.D.   On: 08/13/2021 19:43   MR BRAIN WO CONTRAST  Result Date: 08/14/2021 CLINICAL DATA:  61 year old male with left side weakness, right basal ganglia hemorrhage. Found down. EXAM: MRI HEAD WITHOUT CONTRAST TECHNIQUE: Multiplanar, multiecho pulse sequences of the brain and surrounding structures were obtained without intravenous contrast. COMPARISON:  CT head and CTA head and neck 08/13/2021. FINDINGS: Brain: Biconvex T2 dark, mixed T1 signal intensity intra-axial hemorrhage in the right basal ganglia encompasses 45 by 24 x 53 mm (AP by transverse by CC) for an estimated blood volume of 29 mL. Surrounding edema. Regional mass effect including on the right lateral ventricle. Mild leftward midline shift of 4 mm. No intraventricular hemorrhage. No ventriculomegaly. No transependymal edema. Basilar cisterns remain patent. Susceptibility artifact from the acute blood products on DWI, plus there is a punctate focus of probable T2 shine through in the right centrum semiovale (series 5, image 90) with abundant underlying cerebral white matter T2 and FLAIR hyperintensity there, and in the contralateral left hemisphere where chronic white matter lacunar infarcts are noted. There are chronic lacunar infarcts also in both thalami, the left basal ganglia, and occasionally the cerebellum. No chronic hemorrhage identified in the brain. No cortical encephalomalacia  identified. Cervicomedullary junction and pituitary are within normal limits. Vascular: Major intracranial vascular flow voids are preserved. Skull and upper cervical spine: Visualized bone marrow signal is within normal limits. Negative for age visible cervical spine. Sinuses/Orbits: Negative. Other: Grossly normal visible internal auditory structures. Negative visible scalp and face. IMPRESSION: 1. Biconvex intra-axial hemorrhage in the right basal ganglia with estimated blood volume of 29 mL. Surrounding edema and regional mass effect including mild leftward midline shift of 4 mm. No intraventricular extension. No ventriculomegaly or other complicating features. 2. Underlying advanced chronic small vessel disease. Electronically Signed   By: Odessa FlemingH  Hall M.D.   On: 08/14/2021 04:04    PHYSICAL EXAM  Temp:  [98.1 F (36.7 C)] 98.1 F (36.7 C) (01/22 1802) Pulse Rate:  [80-123] 86 (01/23 0700) Resp:  [14-24] 14 (01/23 0700) BP: (102-145)/(69-98) 102/73 (01/23 0700) SpO2:  [95 %-100 %] 97 % (01/23 0700) Weight:  [82.6 kg] 82.6 kg (01/22 1816)  General - Well nourished, well developed, in no apparent distress.  Cardiovascular - Regular rhythm and rate.  Mental Status -  Level of arousal and orientation to time, place, and person were intact. Language including naming, repetition, comprehension was assessed and found intact. Patient continues to be dysarthric.  Attention span and concentration were normal. Recent and remote memory were intact. Fund of Knowledge was assessed and was  intact.  Cranial Nerves II - XII - II - Visual field intact OU. III, IV, VI - Extraocular movements intact. V - Facial sensation intact bilaterally. VII - Left facial droop. VIII - Hearing & vestibular intact bilaterally. X - Palate elevates symmetrically. XI - Chin turning & shoulder shrug intact bilaterally. XII - Tongue protrusion intact.  Motor Strength - Left sided weakness more prominent in LUE  specifically with extension. LLE able to left against gravity but drift noted compared to RLE.   Motor Tone - Muscle tone was assessed at the neck and appendages and was normal.  Reflexes - The patients reflexes were symmetrical in all extremities and he had no pathological reflexes.  Sensory - Left sided numbness. Unable to clearly identify light tough specifically in LLE.    Coordination - The patient had normal movements in the hands and feet with no ataxia or dysmetria.  Tremor was absent.  Gait and Station - deferred.  ASSESSMENT/PLAN Mr. OLUMIDE DOLINGER is a 61 y.o. male with history of alcohol use, tobacco use, HTN, and alcohol withdrawal seizures presenting with 4 day history of left sided weakness.   Stroke: R BG subcortical hemorrhage, etiology not quite certain, but likely related to HTN and alcohol use CT head: Large right intraparenchymal hemorrhage in lentiform nuclei with mass effect on R lateral ventricle with 0.4 cm R to L midline shift. Underlying small vessel white matter disease, Focal encephalomalacia of anterior left frontal lobe   CTA head & neck negative for LVO. Atheromatous disease in carotid bifurcation and carotid sinus.  MRI  biconvex intra-axial hemorrhage in R basal ganaglia estimated blood volume 29 mL. Surrounding edema and regional mass effect including 4 mm left midline shift, advanced chronic small vessel disease Repeat CT performed at 2PM today shows stable R basal ganlia hemorrhage and 4 mm leftward midline shift  Need to repeat MRI and CTA head and neck once hematoma resolves as outpt 2D Echo EF 60-65% LDL 159 HgbA1c 5.3 VTE prophylaxis - SCDs No antithrombotic prior to admission, now on No antithrombotic. Given patient's alertness and time since initial onset of symptoms, patient does not require hypertonic saline.  Therapy recommendations: SNF Disposition:  pending  Hypertension Home meds:  none Stable BP goal < 160 Long-term BP goal  normotensive  Hyperlipidemia Home meds:  none LDL 159, goal < 70 Recommend adding high intensity statin  Continue statin at discharge  Other Stroke Risk Factors Advanced Age >/= 65  Cigarette smoker advised to stop smoking ETOH use, alcohol level 170, advised to drink no more than 1-2 drink(s) a day  Other Active Problems AKI Alcohol use Disorder: on CIWA w/ ativan  Hospital day # 1  Park Pope, MD PGY1 Resident  To contact Stroke Continuity provider, please refer to [email or web address]. After hours, contact General Neurology

## 2021-08-14 NOTE — ED Notes (Signed)
Readjusted pt in bed.

## 2021-08-14 NOTE — ED Notes (Signed)
NT help setup pt lunch tray.

## 2021-08-14 NOTE — ED Notes (Signed)
Quang Thorpe brother [phone] requesting an update on the patient

## 2021-08-14 NOTE — ED Notes (Signed)
SLP at bedside.

## 2021-08-14 NOTE — Evaluation (Signed)
Physical Therapy Evaluation Patient Details Name: NARAIN WAUGAMAN MRN: [id number] DOB: 02/09/1961 Today's Date: 08/14/2021  History of Present Illness  Pt is a 61 y/o male presenting on 1/22 with L sided weakness, facial droop and dysarthria.  CT head with R subcoritcal hemorrhage and midline shift. PMH includes: ETOH abuse, HTN, seizures.  Clinical Impression  Pt admitted secondary to problem above with deficits below. Pt with L lateral lean and requiring mod A +2 to perform transfers and side steps. Inattention vs neglect to the L; would benefit from further assessment. Recommending SNF level therapies at d/c to address current deficits. Will continue to follow acutely.        Recommendations for follow up therapy are one component of a multi-disciplinary discharge planning process, led by the attending physician.  Recommendations may be updated based on patient status, additional functional criteria and insurance authorization.  Follow Up Recommendations Skilled nursing-short term rehab (<3 hours/day)    Assistance Recommended at Discharge Frequent or constant Supervision/Assistance  Patient can return home with the following  A lot of help with walking and/or transfers;A lot of help with bathing/dressing/bathroom;Help with stairs or ramp for entrance;Assist for transportation;Direct supervision/assist for financial management;Direct supervision/assist for medications management;Assistance with cooking/housework;Assistance with feeding    Equipment Recommendations Other (comment) (TBD)  Recommendations for Other Services       Functional Status Assessment Patient has had a recent decline in their functional status and demonstrates the ability to make significant improvements in function in a reasonable and predictable amount of time.     Precautions / Restrictions Precautions Precautions: Fall Precaution Comments: BP <160 Restrictions Weight Bearing Restrictions: No       Mobility  Bed Mobility Overal bed mobility: Needs Assistance Bed Mobility: Supine to Sit, Sit to Supine     Supine to sit: Mod assist, +2 for physical assistance, +2 for safety/equipment Sit to supine: Max assist, +2 for safety/equipment, +2 for physical assistance   General bed mobility comments: trunk and LB support, pt initating but unable to complete movement towards EOB    Transfers Overall transfer level: Needs assistance Equipment used: 2 person hand held assist Transfers: Sit to/from Stand Sit to Stand: Mod assist, +2 physical assistance, +2 safety/equipment           General transfer comment: L lateral lean and assist required to power up and steady    Ambulation/Gait Ambulation/Gait assistance: Mod assist, +2 physical assistance   Assistive device: 2 person hand held assist         General Gait Details: Pt requiring max multimodal cues to take steps at EOB. Mod A +2 for steadying and pt with L lateral lean.  Stairs            Wheelchair Mobility    Modified Rankin (Stroke Patients Only)       Balance Overall balance assessment: Needs assistance Sitting-balance support: No upper extremity supported, Feet supported Sitting balance-Leahy Scale: Poor Sitting balance - Comments: L lateral lean, able to correct with cueing; at best min guard statically Postural control: Left lateral lean Standing balance support: Bilateral upper extremity supported, During functional activity Standing balance-Leahy Scale: Poor Standing balance comment: relies on BUE and external support                             Pertinent Vitals/Pain Pain Assessment Pain Assessment: No/denies pain    Home Living Family/patient expects to be discharged to:: Private residence  Living Arrangements: Other relatives (brother) Available Help at Discharge: Family;Available PRN/intermittently Type of Home: Mobile home Home Access: Stairs to enter Entrance  Stairs-Rails: None Entrance Stairs-Number of Steps: 4   Home Layout: One level Home Equipment: Agricultural consultant (2 wheels)      Prior Function Prior Level of Function : Independent/Modified Independent;Working/employed               ADLs Comments: works as a Scientist, research (life sciences): Right    Extremity/Trunk Assessment   Upper Extremity Assessment Upper Extremity Assessment: Defer to OT evaluation LUE Deficits / Details: grossly 3-/5 MMT with poor coordination, sensation and neglect vs inattention LUE Sensation: decreased light touch;decreased proprioception LUE Coordination: decreased fine motor;decreased gross motor    Lower Extremity Assessment Lower Extremity Assessment: LLE deficits/detail LLE Deficits / Details: Grossly 3/5 throughout. Decreased sensation    Cervical / Trunk Assessment Cervical / Trunk Assessment: Kyphotic  Communication   Communication: Other (comment) (dysarthric)  Cognition Arousal/Alertness: Awake/alert Behavior During Therapy: WFL for tasks assessed/performed Overall Cognitive Status: No family/caregiver present to determine baseline cognitive functioning Area of Impairment: Orientation, Attention, Memory, Following commands, Safety/judgement, Awareness, Problem solving                 Orientation Level: Disoriented to, Time Current Attention Level: Sustained Memory: Decreased short-term memory Following Commands: Follows one step commands consistently, Follows one step commands with increased time, Follows multi-step commands inconsistently Safety/Judgement: Decreased awareness of deficits, Decreased awareness of safety Awareness: Emergent Problem Solving: Slow processing, Difficulty sequencing, Requires verbal cues General Comments: pt reports 2022, follows simple commands but decreased awareness and problem solving noted        General Comments General comments (skin integrity, edema, etc.): VSS on RA     Exercises     Assessment/Plan    PT Assessment Patient needs continued PT services  PT Problem List Decreased strength;Decreased range of motion;Decreased activity tolerance;Decreased balance;Decreased mobility;Decreased knowledge of use of DME;Decreased knowledge of precautions       PT Treatment Interventions DME instruction;Gait training;Therapeutic activities;Functional mobility training;Therapeutic exercise;Balance training;Stair training;Patient/family education    PT Goals (Current goals can be found in the Care Plan section)  Acute Rehab PT Goals Patient Stated Goal: to go home PT Goal Formulation: With patient Time For Goal Achievement: 08/28/21 Potential to Achieve Goals: Good    Frequency Min 3X/week     Co-evaluation PT/OT/SLP Co-Evaluation/Treatment: Yes Reason for Co-Treatment: For patient/therapist safety;To address functional/ADL transfers PT goals addressed during session: Mobility/safety with mobility;Balance OT goals addressed during session: ADL's and self-care       AM-PAC PT "6 Clicks" Mobility  Outcome Measure Help needed turning from your back to your side while in a flat bed without using bedrails?: A Lot Help needed moving from lying on your back to sitting on the side of a flat bed without using bedrails?: Total Help needed moving to and from a bed to a chair (including a wheelchair)?: Total Help needed standing up from a chair using your arms (e.g., wheelchair or bedside chair)?: Total Help needed to walk in hospital room?: Total Help needed climbing 3-5 steps with a railing? : Total 6 Click Score: 7    End of Session Equipment Utilized During Treatment: Gait belt Activity Tolerance: Patient tolerated treatment well Patient left: in bed;with call bell/phone within reach (on stretcher in ED) Nurse Communication: Mobility status PT Visit Diagnosis: Unsteadiness on feet (R26.81);Muscle weakness (generalized) (M62.81);Difficulty in walking, not  elsewhere classified (R26.2)    Time: 1034-1050 PT Time Calculation (min) (ACUTE ONLY): 16 min   Charges:   PT Evaluation $PT Eval Moderate Complexity: 1 Mod          Reuel Derby, PT, DPT  Acute Rehabilitation Services  Pager: [phone] Office: [phone]   Rudean Hitt 08/14/2021, 1:31 PM

## 2021-08-14 NOTE — ED Notes (Signed)
Attempted to give report x2 

## 2021-08-14 NOTE — Evaluation (Signed)
Occupational Therapy Evaluation Patient Details Name: Jerry Humphrey MRN: [id number] DOB: Dec 02, 1960 Today's Date: 08/14/2021   History of Present Illness Pt is a 61 y/o male presenting on 1/22 with L sided weakness, facial droop and dysarthria.  CT head with R subcoritcal hemorrhage and midline shift. PMH includes: ETOH abuse, HTN, seizures.   Clinical Impression   PTA patient reports independent with ADLs, mobility; working as a Designer, fashion/clothingroofer.  Admitted for above and limited by problem list below, including L sided weakness, coordination, sensation and inattention vs neglect, impaired vision, impaired cognition, balance and activity tolerance.  Pt currently requires mod assist +2 for bed mobility and transfers, mod assist for UB ADLs and up to max assist for LB ADLs.  Pt disoriented to year, follows simple commands but unable to attend to follow multiple step commands, difficulty sequencing and problem solving.  Educated on L UE positioning and safety. Further visual and cognitive assessment will be required. Will follow acutely and recommend continued OT at SNF after dc.      Recommendations for follow up therapy are one component of a multi-disciplinary discharge planning process, led by the attending physician.  Recommendations may be updated based on patient status, additional functional criteria and insurance authorization.   Follow Up Recommendations  Skilled nursing-short term rehab (<3 hours/day)    Assistance Recommended at Discharge Frequent or constant Supervision/Assistance  Patient can return home with the following A lot of help with walking and/or transfers;A lot of help with bathing/dressing/bathroom;Assistance with cooking/housework;Assistance with feeding;Direct supervision/assist for medications management;Direct supervision/assist for financial management;Assist for transportation;Help with stairs or ramp for entrance    Functional Status Assessment  Patient has had a recent  decline in their functional status and demonstrates the ability to make significant improvements in function in a reasonable and predictable amount of time.  Equipment Recommendations  Other (comment) (TBD)    Recommendations for Other Services       Precautions / Restrictions Precautions Precautions: Fall Precaution Comments: BP <160 Restrictions Weight Bearing Restrictions: No      Mobility Bed Mobility Overal bed mobility: Needs Assistance Bed Mobility: Supine to Sit, Sit to Supine     Supine to sit: Mod assist, +2 for physical assistance, +2 for safety/equipment Sit to supine: Max assist, +2 for safety/equipment, +2 for physical assistance   General bed mobility comments: trunk and LB support, pt initating but unable to complete movement towards EOB    Transfers Overall transfer level: Needs assistance Equipment used: 2 person hand held assist Transfers: Sit to/from Stand Sit to Stand: Mod assist, +2 physical assistance, +2 safety/equipment           General transfer comment: L lateral lean and assist required to power up and steady      Balance Overall balance assessment: Needs assistance Sitting-balance support: No upper extremity supported, Feet supported Sitting balance-Leahy Scale: Poor Sitting balance - Comments: L lateral lean, able to correct with cueing; at best min guard statically Postural control: Left lateral lean Standing balance support: Bilateral upper extremity supported, During functional activity Standing balance-Leahy Scale: Poor Standing balance comment: relies on BUE and external support                           ADL either performed or assessed with clinical judgement   ADL Overall ADL's : Needs assistance/impaired     Grooming: Moderate assistance;Sitting           Upper Body Dressing :  Moderate assistance;Sitting   Lower Body Dressing: Maximal assistance;+2 for physical assistance;+2 for safety/equipment    Toilet Transfer: Moderate assistance;+2 for physical assistance;+2 for safety/equipment Toilet Transfer Details (indicate cue type and reason): simulated side stepping towards HOB         Functional mobility during ADLs: Moderate assistance;+2 for physical assistance;+2 for safety/equipment       Vision Baseline Vision/History: 1 Wears glasses (reading only) Ability to See in Adequate Light: 1 Impaired Patient Visual Report: Blurring of vision Vision Assessment?: Yes Eye Alignment: Within Functional Limits Ocular Range of Motion: Within Functional Limits Alignment/Gaze Preference: Within Defined Limits Tracking/Visual Pursuits: Impaired - to be further tested in functional context Visual Fields: Impaired-to be further tested in functional context Additional Comments: difficulty following multiple step commands, L visual deficits vs inattention. continue assessment     Perception     Praxis      Pertinent Vitals/Pain Pain Assessment Pain Assessment: No/denies pain     Hand Dominance Right   Extremity/Trunk Assessment Upper Extremity Assessment Upper Extremity Assessment: LUE deficits/detail LUE Deficits / Details: grossly 3-/5 MMT with poor coordination, sensation and neglect vs inattention LUE Sensation: decreased light touch;decreased proprioception LUE Coordination: decreased fine motor;decreased gross motor   Lower Extremity Assessment Lower Extremity Assessment: Defer to PT evaluation       Communication Communication Communication: Other (comment) (dysarthric)   Cognition Arousal/Alertness: Awake/alert Behavior During Therapy: WFL for tasks assessed/performed Overall Cognitive Status: No family/caregiver present to determine baseline cognitive functioning Area of Impairment: Orientation, Attention, Memory, Following commands, Safety/judgement, Awareness, Problem solving                 Orientation Level: Disoriented to, Time Current Attention Level:  Sustained Memory: Decreased short-term memory Following Commands: Follows one step commands consistently, Follows one step commands with increased time, Follows multi-step commands inconsistently Safety/Judgement: Decreased awareness of deficits, Decreased awareness of safety Awareness: Emergent Problem Solving: Slow processing, Difficulty sequencing, Requires verbal cues General Comments: pt reports 2022, follows simple commands but decreased awareness and problem solving noted     General Comments  VSS on RA    Exercises     Shoulder Instructions      Home Living Family/patient expects to be discharged to:: Private residence Living Arrangements: Other relatives (brother) Available Help at Discharge: Family;Available PRN/intermittently Type of Home: Mobile home Home Access: Stairs to enter Entrance Stairs-Number of Steps: 4 Entrance Stairs-Rails: None Home Layout: One level     Bathroom Shower/Tub: Chief Strategy Officer: Standard     Home Equipment: Agricultural consultant (2 wheels)          Prior Functioning/Environment Prior Level of Function : Independent/Modified Independent;Working/employed               ADLs Comments: works as a Conservation officer, nature: Decreased strength;Decreased range of motion;Impaired balance (sitting and/or standing);Pain;Decreased coordination;Decreased cognition;Decreased safety awareness;Decreased knowledge of use of DME or AE;Decreased knowledge of precautions;Impaired sensation;Impaired UE functional use;Impaired tone;Increased edema;Impaired vision/perception;Decreased activity tolerance      OT Treatment/Interventions: Self-care/ADL training;Therapeutic exercise;Neuromuscular education;DME and/or AE instruction;Therapeutic activities;Patient/family education;Balance training;Cognitive remediation/compensation;Visual/perceptual remediation/compensation;Splinting;Manual therapy    OT Goals(Current goals can be found  in the care plan section) Acute Rehab OT Goals Patient Stated Goal: get better OT Goal Formulation: With patient Time For Goal Achievement: 08/28/21 Potential to Achieve Goals: Fair  OT Frequency: Min 2X/week    Co-evaluation PT/OT/SLP Co-Evaluation/Treatment: Yes Reason for Co-Treatment: For patient/therapist safety;To  address functional/ADL transfers PT goals addressed during session: Mobility/safety with mobility;Balance OT goals addressed during session: ADL's and self-care      AM-PAC OT "6 Clicks" Daily Activity     Outcome Measure Help from another person eating meals?: A Little Help from another person taking care of personal grooming?: A Lot Help from another person toileting, which includes using toliet, bedpan, or urinal?: A Lot Help from another person bathing (including washing, rinsing, drying)?: A Lot Help from another person to put on and taking off regular upper body clothing?: A Lot Help from another person to put on and taking off regular lower body clothing?: A Lot 6 Click Score: 13   End of Session Equipment Utilized During Treatment: Gait belt Nurse Communication: Mobility status  Activity Tolerance: Patient tolerated treatment well Patient left: in bed;with call bell/phone within reach  OT Visit Diagnosis: Other abnormalities of gait and mobility (R26.89);Muscle weakness (generalized) (M62.81);Hemiplegia and hemiparesis;Other symptoms and signs involving cognitive function Hemiplegia - Right/Left: Left Hemiplegia - dominant/non-dominant: Non-Dominant                Time: 1034-1050 OT Time Calculation (min): 16 min Charges:  OT General Charges $OT Visit: 1 Visit OT Evaluation $OT Eval Moderate Complexity: 1 Mod  Barry Brunner, OT Acute Rehabilitation Services Pager [phone] Office [phone]   Chancy Milroy 08/14/2021, 12:41 PM

## 2021-08-14 NOTE — Progress Notes (Signed)
Family Medicine Teaching Service Daily Progress Note Intern Pager: [phone]  Patient name: Jerry Humphrey Medical record number: [id number] Date of birth: 1960-10-24 Age: 61 y.o. Gender: male  Primary Care Provider: Pcp, No Consultants: Neurology Code Status: Full  Pt Overview and Major Events to Date:  08/13/21 - Patient admitted  Assessment and Plan:  Jerry Humphrey is a 61 y.o. male presenting with Left side weakness . PMH is significant for seizure due to alcohol withdrawal, HTN, Alcohol use and Tobacco use    Left side weakness concerning for stroke Patient with left sided weakness following right subcortical hemorrhage on CT head. MRI brain showing intra-axial hemorrhage in right basal ganglia with 29 ml of estimated blood volume.  On neuro exam, RUE, RLE, LLE all 5/5, LUE 3/5., cranial nerves intact grossly with severe dysarthria. Lipid panel below. Speech recommending Dysphagia 3 diet. Repeat CT head did not show any worsening of hemorrhage. Imaging results below. Per neurology note and lipid panel, will start a statin.   Lipid Panel     Component Value Date/Time   CHOL 214 (H) 08/14/2021 0435   TRIG 134 08/14/2021 0435   HDL 28 (L) 08/14/2021 0435   CHOLHDL 7.6 08/14/2021 0435   VLDL 27 08/14/2021 0435   LDLCALC 159 (H) 08/14/2021 0435  -Start Crestor 20 mg daily -F/u TSH -PT/OT -SLP eval -Neuro checks q2h -BP systolic goal < 160  HTN BP has been stable with ranges from 110-130's/70-90's. Patient not taking any antihypertensive -Continue to monitor   AKI Cr on admission 1.28, stable at 1.28 today, with GFR > 60. Patient volume status on exam was appears neutral with no edema.  -Continue to monitor  Hx of Alcohol use  Hx of alcohol withdrawal with seizure Patient reported last drink was 5 days ago. Patient does not appear to be in withdrawal today today, CIWA 0 this morning and 1 last evening. Last CIWA was 1 at 11pm.  -Continue to monitor  Hx of Tobacco  use Patient with smoking history, will consider nicotine patch.   FEN/GI: Dysphagia 3 diet PPx: SCD's for VTE Dispo:SNF in 2-3 days. .   Subjective:  Patient is hungry this morning and awaiting speech eval to eat.   Objective: Temp:  [98.1 F (36.7 C)] 98.1 F (36.7 C) (01/22 1802) Pulse Rate:  [80-123] 101 (01/23 0200) Resp:  [14-24] 18 (01/23 0200) BP: (112-134)/(69-98) 121/78 (01/23 0200) SpO2:  [95 %-100 %] 97 % (01/23 0200) Weight:  [82.6 kg] 82.6 kg (01/22 1816) Physical Exam: General: Well appearing, alert, NAD Cardiovascular: RRR, NRMG Respiratory: CTABL Abdomen: Soft, NTTP, non-distended Extremities: No edema,  Neruo: Dysarthria, LUE strength 3/5, otherwise 5/5 in other extremities, sensation grossly intact throughout Psych:Good mood and affect  Laboratory: Recent Labs  Lab 08/13/21 1830 08/13/21 1839 08/14/21 0435  WBC 15.3*  --  13.6*  HGB 16.6 17.7* 16.2  HCT 49.7 52.0 46.6  PLT 330  --  283   Recent Labs  Lab 08/13/21 1830 08/13/21 1839 08/14/21 0435  NA 132* 134* 133*  K 3.9 4.0 4.0  CL 94* 97* 100  CO2 26  --  23  BUN 37* 39* 34*  CREATININE 1.28* 1.20 1.28*  CALCIUM 9.2  --  8.6*  PROT 8.0  --   --   BILITOT 1.9*  --   --   ALKPHOS 55  --   --   ALT 74*  --   --   AST 64*  --   --  GLUCOSE 102* 100* 120*      Imaging/Diagnostic Tests: CTA: Negative CTA for large vessel occlusion, Atheromatous disease about the carotid bifurcations and carotid siphons without hemodynamically significant or correctable stenosis. No vascular abnormality seen underlying the right basal ganglia hemorrhage.  CT Head WO: Large intraparenchymal hemorrhage centered within the right lentiform nuclei, measuring 4.7 x 2.2 cm. There is mass effect on the right lateral ventricle with 0.4 cm right to left midline shift. Underlying small-vessel white matter disease. Focal encephalomalacia of the anterior left frontal lobe, in keeping with prior infarction.  MRI  Brain [id number] intra-axial hemorrhage in the right basal ganglia  with estimated blood volume of 29 mL. Surrounding edema and regional mass effect including mild leftward midline shift of 4 mm. No intraventricular extension. No ventriculomegaly or other complicating features. Underlying advanced chronic small vessel disease.  Repeat CT Head: A 4.7 x 2.3 x 3.8 cm parenchymal hemorrhage centered in the right lentiform nucleus is unchanged in size, as are surrounding edema and 4 mm of leftward midline shift. No new intracranial hemorrhage, acute cortically based infarct, or extra-axial fluid collection is identified. Confluent hypodensities in the cerebral white matter bilaterally are unchanged and nonspecific but compatible with severe chronic small vessel ischemic disease. A small chronic left frontal cortical infarct and chronic bilateral thalamic and right cerebellar lacunar infarcts are again noted. There is mild cerebral atrophy.     Jerry Kinds, MD 08/14/2021, 5:44 AM PGY-1, [facility] FPTS Intern pager: [phone], text pages welcome

## 2021-08-14 NOTE — ED Notes (Signed)
Pt gave RN permission to speak with brother Mellody Dance about pt care.

## 2021-08-14 NOTE — Progress Notes (Signed)
FPTS Brief Progress Note  S: Patient sitting in bed on evaluation.  He reports that his right knee is hurting but otherwise has no complaints.  Denies any new symptoms from his stroke.   O: BP 134/85    Pulse 98    Temp 98.8 F (37.1 C) (Oral)    Resp 19    Ht 5\' 8"  (1.727 m)    Wt 79.9 kg    SpO2 98%    BMI 26.78 kg/m   General: Pleasant 61 year old male Cardiac: Regular rate Respiratory: Normal work of breathing, speaking in full sentences Neuro: Continued left facial droop, left upper and lower extremity weakness as well as decreased sensation  A/P: Stroke Large right intraparenchymal hemorrhage and lentiform nuclei.  Speech recommended dysphagia 3 texture with thin liquids.  PT and OT recommended SNF.  Repeat CT 1/23 showing no change in hemorrhage.  Echo completed today. - Continue recommendations per neurology - Continue to plan for SNF after stable for discharge -Neurochecks every 4 hours - BP goal less than 160 - Continue CIWA protocol - Orders reviewed. Labs for AM ordered, which was adjusted as needed.     2/23, MD 08/14/2021, 11:55 PM PGY-3, [place] [facility] Night Resident  Please page [phone] with questions.

## 2021-08-14 NOTE — Progress Notes (Signed)
°  Echocardiogram 2D Echocardiogram has been performed.  Darlina Sicilian M 08/14/2021, 3:21 PM

## 2021-08-15 LAB — BASIC METABOLIC PANEL
Anion gap: 9 (ref 5–15)
BUN: 22 mg/dL (ref 8–23)
CO2: 24 mmol/L (ref 22–32)
Calcium: 8.7 mg/dL — ABNORMAL LOW (ref 8.9–10.3)
Chloride: 99 mmol/L (ref 98–111)
Creatinine, Ser: 1.01 mg/dL (ref 0.61–1.24)
GFR, Estimated: >60 mL/min (ref >60)
Glucose, Bld: 117 mg/dL — ABNORMAL HIGH (ref 70–99)
Potassium: 4 mmol/L (ref 3.5–5.1)
Sodium: 132 mmol/L — ABNORMAL LOW (ref 135–145)

## 2021-08-15 LAB — GLUCOSE, CAPILLARY: Glucose-Capillary: 134 mg/dL — ABNORMAL HIGH (ref 70–99)

## 2021-08-15 MED ORDER — ROSUVASTATIN CALCIUM 20 MG PO TABS
20.0000 mg | ORAL_TABLET | Freq: Every day | ORAL | Status: DC
  Administered 2021-08-15 – 2021-09-01 (×18): 20 mg via ORAL
  Filled 2021-08-15 (×18): qty 1

## 2021-08-15 MED ORDER — DICLOFENAC SODIUM 1 % EX GEL
2.0000 g | Freq: Two times a day (BID) | CUTANEOUS | Status: DC | PRN
  Administered 2021-08-18 – 2021-08-19 (×2): 2 g via TOPICAL
  Filled 2021-08-15: qty 100

## 2021-08-15 NOTE — Evaluation (Signed)
Speech Language Pathology Evaluation Patient Details Name: Jerry Humphrey MRN: [id number] DOB: 12/23/1960 Today's Date: 08/15/2021 Time: [id number] SLP Time Calculation (min) (ACUTE ONLY): 11 min  Problem List:  Patient Active Problem List   Diagnosis Date Noted   Hypertension    Alcohol use    Subcortical hemorrhage ([place]) 08/13/2021   Closed fracture of right distal tibia    Tibia/fibula fracture 02/01/2019   Alcohol abuse 01/30/2019   Chest pain 01/30/2019   Leukocytosis 01/30/2019   Tobacco abuse 01/30/2019   Closed fracture of right tibia and fibula 01/30/2019   Fall due to stumbling 01/30/2019   Seizure disorder ([place]) 01/30/2019   Past Medical History:  Past Medical History:  Diagnosis Date   ETOH abuse    Hypertension    Seizures ([place])    Past Surgical History:  Past Surgical History:  Procedure Laterality Date   OPEN REDUCTION INTERNAL FIXATION (ORIF) TIBIA/FIBULA FRACTURE Right 02/01/2019   Procedure: OPEN REDUCTION INTERNAL FIXATION (ORIF) TIBIA/FIBULA FRACTURE;  Surgeon: Netta Cedars, MD;  Location: [facility] ORS;  Service: Orthopedics;  Laterality: Right;   HPI:  61 yo with PMHx of ETOH and Tobacco abuse, HTN, Alcohol withdrawal Seizures presented with left-sided weakness. He endorses a slight Headache this morning with a reduced sensation in his left extremities. MRI Biconvex intra-axial hemorrhage in the right basal ganglia with estimated blood volume of 29 mL. Surrounding edema and regional mass effect including mild leftward midline shift of 4 mm.   Assessment / Plan / Recommendation Clinical Impression  Pt exhibits mild-moderate dysarthria in sentences, mild cognitive impairments with decreased awareness. He demonstrated functional problem solving relating to basic needs however could benefit from continue treatment for higher level cognition, awareness and dysarthria at skilled nursing. Will continue to see while in acutre care.    SLP Assessment  SLP  Recommendation/Assessment: Patient needs continued Speech Lanaguage Pathology Services SLP Visit Diagnosis: Dysarthria and anarthria (R47.1);Cognitive communication deficit (R41.841)    Recommendations for follow up therapy are one component of a multi-disciplinary discharge planning process, led by the attending physician.  Recommendations may be updated based on patient status, additional functional criteria and insurance authorization.    Follow Up Recommendations  Skilled nursing-short term rehab (<3 hours/day)    Assistance Recommended at Discharge  Set up Supervision/Assistance  Functional Status Assessment    Frequency and Duration min 2x/week  2 weeks      SLP Evaluation Cognition  Overall Cognitive Status: Impaired/Different from baseline Arousal/Alertness: Awake/alert Orientation Level: Oriented to person;Oriented to time Attention: Sustained Sustained Attention: Appears intact Memory:  (to be further assessed) Awareness: Impaired Awareness Impairment: Anticipatory impairment Problem Solving: Impaired Problem Solving Impairment:  (suspect mod-high level) Safety/Judgment: Impaired       Comprehension  Auditory Comprehension Overall Auditory Comprehension: Appears within functional limits for tasks assessed Visual Recognition/Discrimination Discrimination: Not tested Reading Comprehension Reading Status: Not tested    Expression Expression Primary Mode of Expression: Verbal Verbal Expression Overall Verbal Expression: Appears within functional limits for tasks assessed Initiation: No impairment Naming: Not tested Pragmatics: No impairment Written Expression Dominant Hand: Right Written Expression: Not tested   Oral / Motor  Oral Motor/Sensory Function Overall Oral Motor/Sensory Function: Mild impairment Facial ROM: Reduced left;Suspected CN VII (facial) dysfunction Facial Symmetry: Abnormal symmetry left;Suspected CN VII (facial) dysfunction Facial  Strength: Reduced left;Suspected CN VII (facial) dysfunction Facial Sensation: Reduced left;Suspected CN V (Trigeminal) dysfunction Lingual ROM: Reduced right;Suspected CN XII (hypoglossal) dysfunction Lingual Symmetry: Abnormal symmetry left;Suspected CN XII (hypoglossal) dysfunction  Mandible: Within Functional Limits Motor Speech Overall Motor Speech: Impaired Respiration: Within functional limits Phonation: Low vocal intensity Resonance: Within functional limits Articulation: Impaired Level of Impairment: Word Intelligibility: Intelligibility reduced Word: 75-100% accurate Phrase: 50-74% accurate Sentence: 50-74% accurate Conversation: 25-49% accurate Motor Planning: Witnin functional limits            Houston Siren 08/15/2021, 9:31 AM Orbie Pyo Colvin Caroli.Ed Risk analyst [phone] Office [phone]

## 2021-08-15 NOTE — Progress Notes (Signed)
°  Transition of Care [facility]) Screening Note   Patient Details  Name: [name] Date of Birth: March 30, 1961   [facility]) CM/SW Contact:    Kermit Balo, RN Phone Number: 08/15/2021, 2:20 PM    Transition of Care Department [facility]) has reviewed patient. We will continue to monitor patient advancement through interdisciplinary progression rounds. If new patient transition needs arise, please place a TOC consult. CM has asked financial counseling ([employer]) to see patient for potential medicaid.  CM has left voicemail for patients brother.

## 2021-08-15 NOTE — Progress Notes (Signed)
Family Medicine Teaching Service Daily Progress Note Intern Pager: [phone]  Patient name: Jerry Humphrey Medical record number: [id number] Date of birth: [birth year] Age: 61 y.o. Gender: male  Primary Care Provider: Pcp, No Consultants: Neurology Code Status: Full  Pt Overview and Major Events to Date:  08/13/21 - Patient admitted  Assessment and Plan:  Jerry Humphrey is a 61 y.o. male presenting with Left side weakness . PMH is significant for seizure due to alcohol withdrawal, HTN, Alcohol use and Tobacco use    Left side weakness   Hemorrhagic stroke Patient had echo performed that was normal. Patient stable and awaiting SNF placement, per PT/OT recommendations.  -PT/OT/SLP -Neuro checks q4h -BP systolic gaol < 160 -Start Crestor 20 mg -Repeat MRI head when hematoma resolves outpatient  HTN BP ranges have been between in the 130's/70-80's. Patient not taking any antihypertensives.  -Continue to monitor  AKI Cr yesterday 1.28, will monitor every other day.   Hx of Alcohol use  Hx of alcohol withdrawal with seizure Day 6 since last drink, CIWA 0 at 11 pm and 4 am  Hx of Tobacco use -Consider starting nicotine patch, now that CIWAS are stable and 0  FEN/GI: Dysphagia 3 diet PPx: SCD Dispo:SNF in 2-3 days.   Subjective:  Patient with no complaints today, did express frustration with progress in effected hand's ability.   Objective: Temp:  [98.3 F (36.8 C)-98.8 F (37.1 C)] 98.3 F (36.8 C) (01/23 2340) Pulse Rate:  [70-98] 79 (01/24 0400) Resp:  [14-23] 17 (01/23 2340) BP: (100-169)/(67-89) 137/89 (01/24 0400) SpO2:  [93 %-100 %] 98 % (01/23 2019) Weight:  [79.9 kg] 79.9 kg (01/23 1540) Physical Exam: General: Well appearing, alert, NAD Cardiovascular: RRR, NRMG Respiratory: CTABL Abdomen: Soft, NTTP, non-distended Extremities: moving all extremities independently, no edema  Laboratory: Recent Labs  Lab 08/13/21 1830 08/13/21 1839 08/14/21 0435   WBC 15.3*  --  13.6*  HGB 16.6 17.7* 16.2  HCT 49.7 52.0 46.6  PLT 330  --  283   Recent Labs  Lab 08/13/21 1830 08/13/21 1839 08/14/21 0435  NA 132* 134* 133*  K 3.9 4.0 4.0  CL 94* 97* 100  CO2 26  --  23  BUN 37* 39* 34*  CREATININE 1.28* 1.20 1.28*  CALCIUM 9.2  --  8.6*  PROT 8.0  --   --   BILITOT 1.9*  --   --   ALKPHOS 55  --   --   ALT 74*  --   --   AST 64*  --   --   GLUCOSE 102* 100* 120*      Imaging/Diagnostic Tests:   Bess Kinds, MD 08/15/2021, 6:26 AM PGY-1, [place] [facility] FPTS Intern pager: [phone], text pages welcome

## 2021-08-15 NOTE — Progress Notes (Signed)
FPTS Brief Progress Note  S: Patient sleeping comfortably when I went by the room.  Did not disturb him.   O: BP 127/74 (BP Location: Left Arm)    Pulse 73    Temp 98.9 F (37.2 C) (Oral)    Resp 19    Ht 5\' 8"  (1.727 m)    Wt 79.9 kg    SpO2 97%    BMI 26.78 kg/m   General: 61 year old male, resting comfortably in bed Respiratory: Normal work of breathing Cardiac: Regular rate  A/P: Stroke Stable from previous evaluations.  Continuing PT/OT/speech therapy - Continue neurochecks every 4 hours - Continue BP goal less than 160 - Patient will need repeat MRI when hematoma resolves in the outpatient setting   - Orders reviewed. Labs for AM ordered, which was adjusted as needed.   77, MD 08/15/2021, 7:56 PM PGY-3,  Family Medicine Night Resident  Please page [phone] with questions.

## 2021-08-15 NOTE — Progress Notes (Signed)
Speech Language Pathology Treatment: Dysphagia  Patient Details Name: Jerry Humphrey MRN: [id number] DOB: [birth year] Today's Date: 08/15/2021 Time: [id number] SLP Time Calculation (min) (ACUTE ONLY): 11 min  Assessment / Plan / Recommendation Clinical Impression  Mr Widmeyer seen for swallow and cognitive eval (see next note). There were no significant observations with graham cracker and thin liquids via straw. No pocketing on left side oral cavity. Reviewed pt to periodically check left side. No cough or throat clearing. Recommend continue Dys 3 texture until oral-motor strength and sensation improves and he can better compensate for deficits.    HPI HPI: 61 yo with PMHx of ETOH and Tobacco abuse, HTN, Alcohol withdrawal Seizures presented with left-sided weakness. He endorses a slight Headache this morning with a reduced sensation in his left extremities. MRI Biconvex intra-axial hemorrhage in the right basal ganglia with estimated blood volume of 29 mL. Surrounding edema and regional mass effect including mild leftward midline shift of 4 mm.      SLP Plan  Continue with current plan of care      Recommendations for follow up therapy are one component of a multi-disciplinary discharge planning process, led by the attending physician.  Recommendations may be updated based on patient status, additional functional criteria and insurance authorization.    Recommendations  Diet recommendations: Dysphagia 3 (mechanical soft);Thin liquid Liquids provided via: Cup;Straw Medication Administration: Whole meds with puree Supervision: Patient able to self feed;Staff to assist with self feeding;Full supervision/cueing for compensatory strategies Compensations: Slow rate;Small sips/bites;Lingual sweep for clearance of pocketing Postural Changes and/or Swallow Maneuvers: Seated upright 90 degrees                Oral Care Recommendations: Oral care BID Follow Up Recommendations: Skilled  nursing-short term rehab (<3 hours/day) Assistance recommended at discharge: Frequent or constant Supervision/Assistance SLP Visit Diagnosis: Dysphagia, unspecified (R13.10) Plan: Continue with current plan of care           Houston Siren  08/15/2021, 9:09 AM  Orbie Pyo Colvin Caroli.Ed Risk analyst [phone] Office [phone]

## 2021-08-15 NOTE — Hospital Course (Addendum)
Jerry Humphrey is a 61 y.o. male presenting with Left side weakness . PMH is significant for seizure due to alcohol withdrawal, HTN, Alcohol use and Tobacco use. His hospital course is below   Left side weakness 2/2 hemorrhagic stroke Patient reports falling 4 days prior to admission and was unable to get up due to left sided weakness.  He endorses paresthesia of extremities and left upper extremity weakness.  He also reports right upper extremity tingling but full strength and function of the right upper extremity.  On exam patient had left upper extremity weakness, profound dysarthria, facial droop and left tongue deviation. Muscle strength on RUE, RLE and LLE is 5/5 and 3/5 on LUE.  Initial labs show mildly elevated troponin 66 >68, CMP was unremarkable with no electrolyte abnormalities, other than slightly elevated AST, ALT 64 and 74 respectively.  Creatinine was 1.28 and EKG showed sinus rhythm with no ST changes.  CT neck was unremarkable however CT head showed right subcortical hemorrhage with right to left midline shift.  Neurology was consulted who recommend avoiding anticoagulant and antiplatelet. PT/OT/SLP were consulted and saw patient during hospitalization. Patient had good progress with PT/OT/SLP and was recommended for SNF. By day of discharge, patient's neuro exam was stable.   AKI On admission Creatinine was 1.28 and improved to 1.20 on recheck. GFR >60. Patient Cr had improved and AKI resolved by discharge.  Hyponatremia Unlikely SIADH due to normal serum osmolality (should be low), with high urine osmolality. We monitored Na regularly and it was maintained from high 120s-low 130s. Patient was asymptomatic and this was stable at time of discharge.   HTN BP range on admission has ranged from normotensive to hypertensive requiring medications inpatient. Home medication include amlodipine 5 mg daily.  However patient has not had this medication in the last 2 years. Patient was started  on amlodipine 10 mg as well as HCTZ 25 mg and had good control of BP with those two medications. By discharge patient BP had been well controlled.    Hx of Alcohol use  Hx of alcohol withdrawal with seizure Patient reports prior history of alcohol withdrawal with seizure requiring hospitalization.  He drinks on average two 64 ounce beer daily.  He reports his last drink was 4 days ago where he had to 64 ounce beer. While inpatient, patient CIWAS were monitored and remained low, requiring no treatment for/concern of ETOH withdrawal.   Right Knee Pain XR showed no evidence of fracture or bony abnormality. Provided Diclofenac gel for pain relief.   Suicidal Ideation On 2/6 patient stated plan/intent for suicide. Suicide precautions were initiated. Psychaitry saw patient and cleared him for SNF. He was started on Prozac 20 mg daily.

## 2021-08-15 NOTE — Progress Notes (Addendum)
STROKE TEAM PROGRESS NOTE   INTERVAL HISTORY Patient was seen and assessed at the bedside. Patient appears to be gradually improving. Patient denies any emergent neurological deficits at this time. We will continue to follow.  Vitals:   08/14/21 2340 08/15/21 0400 08/15/21 0857 08/15/21 1202  BP: 135/78 137/89 134/80 119/78  Pulse: 89 79 67 67  Resp: 17  14 19   Temp: 98.3 F (36.8 C)  98 F (36.7 C) 98.8 F (37.1 C)  TempSrc: Oral  Oral Oral  SpO2:   94% 95%  Weight:      Height:       CBC:  Recent Labs  Lab 08/13/21 1830 08/13/21 1839 08/14/21 0435  WBC 15.3*  --  13.6*  NEUTROABS 10.3*  --   --   HGB 16.6 17.7* 16.2  HCT 49.7 52.0 46.6  MCV 85.2  --  83.7  PLT 330  --  283    Basic Metabolic Panel:  Recent Labs  Lab 08/13/21 1830 08/13/21 1839 08/14/21 0435  NA 132* 134* 133*  K 3.9 4.0 4.0  CL 94* 97* 100  CO2 26  --  23  GLUCOSE 102* 100* 120*  BUN 37* 39* 34*  CREATININE 1.28* 1.20 1.28*  CALCIUM 9.2  --  8.6*  MG  --   --  2.3  PHOS  --   --  3.6    Lipid Panel:  Recent Labs  Lab 08/14/21 0435  CHOL 214*  TRIG 134  HDL 28*  CHOLHDL 7.6  VLDL 27  LDLCALC 161159*    HgbA1c:  Recent Labs  Lab 08/14/21 0435  HGBA1C 6.0*   Urine Drug Screen:  Recent Labs  Lab 08/14/21 1647  LABOPIA NONE DETECTED  COCAINSCRNUR NONE DETECTED  LABBENZ NONE DETECTED  AMPHETMU NONE DETECTED  THCU POSITIVE*  LABBARB NONE DETECTED     Alcohol Level No results for input(s): ETH in the last 168 hours.  IMAGING past 24 hours CT HEAD WO CONTRAST (5MM)  Result Date: 08/14/2021 CLINICAL DATA:  Hemorrhagic stroke follow-up. EXAM: CT HEAD WITHOUT CONTRAST TECHNIQUE: Contiguous axial images were obtained from the base of the skull through the vertex without intravenous contrast. RADIATION DOSE REDUCTION: This exam was performed according to the departmental dose-optimization program which includes automated exposure control, adjustment of the mA and/or kV according  to patient size and/or use of iterative reconstruction technique. COMPARISON:  Head CT/CTA 08/13/2021 and head MRI 08/14/2021 FINDINGS: Brain: A 4.7 x 2.3 x 3.8 cm parenchymal hemorrhage centered in the right lentiform nucleus is unchanged in size, as are surrounding edema and 4 mm of leftward midline shift. No new intracranial hemorrhage, acute cortically based infarct, or extra-axial fluid collection is identified. Confluent hypodensities in the cerebral white matter bilaterally are unchanged and nonspecific but compatible with severe chronic small vessel ischemic disease. A small chronic left frontal cortical infarct and chronic bilateral thalamic and right cerebellar lacunar infarcts are again noted. There is mild cerebral atrophy. Vascular: Calcified atherosclerosis at the skull base. No hyperdense vessel. Skull: No acute fracture or suspicious osseous lesion. Sinuses/Orbits: Visualized paranasal sinuses and mastoid air cells are clear. Unremarkable orbits. Other: None. IMPRESSION: 1. Unchanged right basal ganglia hemorrhage and 4 mm of leftward midline shift. 2. No new intracranial abnormality. 3. Severe chronic small vessel ischemic disease. Electronically Signed   By: Sebastian AcheAllen  Grady M.D.   On: 08/14/2021 13:14   ECHOCARDIOGRAM COMPLETE  Result Date: 08/14/2021    ECHOCARDIOGRAM REPORT   Patient Name:  [name] Date of Exam: 08/14/2021 Medical Rec #:  [id number]        Height:       68.0 in Accession #:    [id number]       Weight:       182.1 lb Date of Birth:  07-22-1961        BSA:          1.964 m Patient Age:    61 years         BP:           122/67 mmHg Patient Gender: M                HR:           83 bpm. Exam Location:  Inpatient Procedure: 2D Echo, Cardiac Doppler and Color Doppler Indications:    Stroke I63.9  History:        Patient has prior history of Echocardiogram examinations, most                 recent 01/31/2019. Risk Factors:Hypertension.  Sonographer:    Leta Jungling RDCS  Referring Phys: [id number] [facility] Dalan Cowger  Sonographer Comments: Did not want definity used IMPRESSIONS  1. Left ventricular ejection fraction, by estimation, is 60 to 65%. The left ventricle has normal function. The left ventricle has no regional wall motion abnormalities. Left ventricular diastolic parameters were normal.  2. Right ventricular systolic function is normal. The right ventricular size is normal.  3. The mitral valve is abnormal. Trivial mitral valve regurgitation. No evidence of mitral stenosis.  4. Poor quality images mild gradient across AV morphologically not stenotic SAM not clearly visualized but also appears to be a small LVOT or sub valvular gradient . The aortic valve was not well visualized. Aortic valve regurgitation is not visualized.  Mild aortic valve stenosis.  5. The inferior vena cava is normal in size with greater than 50% respiratory variability, suggesting right atrial pressure of 3 mmHg. FINDINGS  Left Ventricle: Left ventricular ejection fraction, by estimation, is 60 to 65%. The left ventricle has normal function. The left ventricle has no regional wall motion abnormalities. The left ventricular internal cavity size was normal in size. There is  no left ventricular hypertrophy. Left ventricular diastolic parameters were normal. Right Ventricle: The right ventricular size is normal. No increase in right ventricular wall thickness. Right ventricular systolic function is normal. Left Atrium: Left atrial size was normal in size. Right Atrium: Right atrial size was normal in size. Pericardium: There is no evidence of pericardial effusion. Mitral Valve: The mitral valve is abnormal. There is mild thickening of the mitral valve leaflet(s). There is mild calcification of the mitral valve leaflet(s). Trivial mitral valve regurgitation. No evidence of mitral valve stenosis. Tricuspid Valve: The tricuspid valve is normal in structure. Tricuspid valve regurgitation is not demonstrated. No evidence  of tricuspid stenosis. Aortic Valve: Poor quality images mild gradient across AV morphologically not stenotic SAM not clearly visualized but also appears to be a small LVOT or sub valvular gradient. The aortic valve was not well visualized. Aortic valve regurgitation is not visualized. Mild aortic stenosis is present. Aortic valve mean gradient measures 13.0 mmHg. Aortic valve peak gradient measures 23.2 mmHg. Pulmonic Valve: The pulmonic valve was normal in structure. Pulmonic valve regurgitation is not visualized. No evidence of pulmonic stenosis. Aorta: The aortic root is normal in size and structure. Venous: The inferior vena cava is normal in size with greater than  50% respiratory variability, suggesting right atrial pressure of 3 mmHg. IAS/Shunts: No atrial level shunt detected by color flow Doppler.  LEFT VENTRICLE PLAX 2D LVIDd:         4.10 cm   Diastology LVIDs:         2.90 cm   LV e' medial:    6.31 cm/s LV PW:         0.90 cm   LV E/e' medial:  11.2 LV IVS:        1.00 cm   LV e' lateral:   9.25 cm/s LVOT diam:     1.90 cm   LV E/e' lateral: 7.6 LVOT Area:     2.84 cm  RIGHT VENTRICLE TAPSE (M-mode): 2.6 cm LEFT ATRIUM             Index        RIGHT ATRIUM          Index LA diam:        3.00 cm 1.53 cm/m   RA Area:     8.34 cm LA Vol (A2C):   36.5 ml 18.59 ml/m  RA Volume:   14.20 ml 7.23 ml/m LA Vol (A4C):   48.4 ml 24.64 ml/m LA Biplane Vol: 42.4 ml 21.59 ml/m  AORTIC VALVE AV Vmax:      241.00 cm/s AV Vmean:     167.000 cm/s AV VTI:       0.464 m AV Peak Grad: 23.2 mmHg AV Mean Grad: 13.0 mmHg  AORTA Ao Root diam: 3.30 cm Ao Asc diam:  2.90 cm MITRAL VALVE MV Area (PHT): 4.01 cm    SHUNTS MV Decel Time: 189 msec    Systemic Diam: 1.90 cm MV E velocity: 70.60 cm/s MV A velocity: 80.20 cm/s MV E/A ratio:  0.88 Charlton HawsPeter Nishan MD Electronically signed by Charlton HawsPeter Nishan MD Signature Date/Time: 08/14/2021/3:34:50 PM    Final     PHYSICAL EXAM  Temp:  [98 F (36.7 C)-98.8 F (37.1 C)] 98.8 F (37.1  C) (01/24 1202) Pulse Rate:  [67-98] 67 (01/24 1202) Resp:  [14-23] 19 (01/24 1202) BP: (119-169)/(67-89) 119/78 (01/24 1202) SpO2:  [94 %-100 %] 95 % (01/24 1202) Weight:  [79.9 kg] 79.9 kg (01/23 1540)  General - Well nourished, well developed, in no apparent distress.  Cardiovascular - Regular rhythm and rate.  Mental Status -  Level of arousal and orientation to time, place, and person were intact. Language including naming, repetition, comprehension was assessed and found intact. Patient continues to be dysarthric.  Attention span and concentration were normal. Recent and remote memory were intact. Fund of Knowledge was assessed and was intact.  Cranial Nerves II - XII - II - Visual field intact OU. III, IV, VI - Extraocular movements intact. V - Facial sensation intact bilaterally. VII - Left facial droop. VIII - Hearing & vestibular intact bilaterally. X - Palate elevates symmetrically. XI - Chin turning & shoulder shrug intact bilaterally. XII - Tongue protrusion intact.  Motor Strength - Left sided weakness more prominent in LUE specifically with extension. LLE able to left against gravity but drift noted compared to RLE.   Motor Tone - Muscle tone was assessed at the neck and appendages and was normal.  Reflexes - The patients reflexes were symmetrical in all extremities and he had no pathological reflexes.  Sensory - Left sided numbness. Unable to clearly identify light tough specifically in LLE.    Coordination - The patient had normal movements in the  hands and feet with no ataxia or dysmetria.  Tremor was absent.  Gait and Station - deferred.  ASSESSMENT/PLAN Mr. EVERADO PILLSBURY is a 61 y.o. male with history of alcohol use, tobacco use, HTN, and alcohol withdrawal seizures presenting with 4 day history of left sided weakness.   Stroke: R BG subcortical hemorrhage, etiology not quite certain, but likely related to HTN and alcohol use CT head: Large right  intraparenchymal hemorrhage in lentiform nuclei with mass effect on R lateral ventricle with 0.4 cm R to L midline shift. Underlying small vessel white matter disease, Focal encephalomalacia of anterior left frontal lobe   CTA head & neck negative for LVO. Atheromatous disease in carotid bifurcation and carotid sinus.  MRI  biconvex intra-axial hemorrhage in R basal ganaglia estimated blood volume 29 mL. Surrounding edema and regional mass effect including 4 mm left midline shift, advanced chronic small vessel disease Repeat CT performed at 2PM today shows stable R basal ganlia hemorrhage and 4 mm leftward midline shift  Need to repeat MRI and CTA head and neck once hematoma resolves as outpt 2D Echo EF 60-65% LDL 159 HgbA1c 5.3 VTE prophylaxis - SCDs No antithrombotic prior to admission, now on No antithrombotic. Therapy recommendations: SNF Disposition:  pending  Hypertension Home meds:  none Stable BP goal < 160 Long-term BP goal normotensive  Hyperlipidemia Home meds:  none LDL 159, goal < 70 Recommend adding lipitor 40 on discharge.   Other Stroke Risk Factors Advanced Age >/= 23  Cigarette smoker advised to stop smoking ETOH use, alcohol level 170, advised to drink no more than 1-2 drink(s) a day  Other Active Problems AKI Alcohol use Disorder: on CIWA w/ ativan  Hospital day # 2  Park Pope, MD PGY1 Resident  ATTENDING NOTE: I reviewed above note and agree with the assessment and plan. Pt was seen and examined.   No family is at the bedside. Neuro unchanged, no acute event overnight. CT repeat yesterday afternoon showed stable hematoma and mass effect. Recommend to repeat CT if any neuro changes. He does need to repeat MRI and CTA head and neck once hematoma resolves, likely at time of follow up as outpt. Consider start statin on discharge. Will follow   For detailed assessment and plan, please refer to above as I have made changes wherever appropriate.   Marvel Plan, MD PhD Stroke Neurology 08/15/2021 1:09 PM   To contact Stroke Continuity provider, please refer to [email or web address]. After hours, contact General Neurology

## 2021-08-16 LAB — BASIC METABOLIC PANEL
Anion gap: 6 (ref 5–15)
Anion gap: 9 (ref 5–15)
Anion gap: 8 (ref 5–15)
BUN: 17 mg/dL (ref 8–23)
BUN: 17 mg/dL (ref 8–23)
BUN: 18 mg/dL (ref 8–23)
CO2: 24 mmol/L (ref 22–32)
CO2: 22 mmol/L (ref 22–32)
CO2: 22 mmol/L (ref 22–32)
Calcium: 8.4 mg/dL — ABNORMAL LOW (ref 8.9–10.3)
Calcium: 8.6 mg/dL — ABNORMAL LOW (ref 8.9–10.3)
Calcium: 8.5 mg/dL — ABNORMAL LOW (ref 8.9–10.3)
Chloride: 97 mmol/L — ABNORMAL LOW (ref 98–111)
Chloride: 99 mmol/L (ref 98–111)
Chloride: 102 mmol/L (ref 98–111)
Creatinine, Ser: 0.93 mg/dL (ref 0.61–1.24)
Creatinine, Ser: 0.98 mg/dL (ref 0.61–1.24)
Creatinine, Ser: 1.04 mg/dL (ref 0.61–1.24)
GFR, Estimated: >60 mL/min (ref >60)
GFR, Estimated: >60 mL/min (ref >60)
GFR, Estimated: >60 mL/min (ref >60)
Glucose, Bld: 101 mg/dL — ABNORMAL HIGH (ref 70–99)
Glucose, Bld: 105 mg/dL — ABNORMAL HIGH (ref 70–99)
Glucose, Bld: 120 mg/dL — ABNORMAL HIGH (ref 70–99)
Potassium: 3.7 mmol/L (ref 3.5–5.1)
Potassium: 4.3 mmol/L (ref 3.5–5.1)
Potassium: 4.2 mmol/L (ref 3.5–5.1)
Sodium: 127 mmol/L — ABNORMAL LOW (ref 135–145)
Sodium: 130 mmol/L — ABNORMAL LOW (ref 135–145)
Sodium: 132 mmol/L — ABNORMAL LOW (ref 135–145)

## 2021-08-16 LAB — SODIUM, URINE, RANDOM: Sodium, Ur: 51 mmol/L

## 2021-08-16 LAB — RESP PANEL BY RT-PCR (FLU A&B, COVID) ARPGX2
Influenza A by PCR: NEGATIVE
Influenza B by PCR: NEGATIVE
SARS Coronavirus 2 by RT PCR: NEGATIVE

## 2021-08-16 LAB — OSMOLALITY, URINE: Osmolality, Ur: 400 mOsm/kg (ref 300–900)

## 2021-08-16 LAB — OSMOLALITY: Osmolality: 278 mOsm/kg (ref 275–295)

## 2021-08-16 MED ORDER — BOOST / RESOURCE BREEZE PO LIQD CUSTOM
1.0000 | Freq: Three times a day (TID) | ORAL | Status: DC
  Administered 2021-08-16 – 2021-09-01 (×46): 1 via ORAL
  Filled 2021-08-16: qty 1

## 2021-08-16 NOTE — Progress Notes (Signed)
Inpatient Rehabilitation Admissions Coordinator   I met at bedside with patient for rehab assessment. We discussed goals and expectations of a possible CIR admit pending verification of caregiver supports. I have left a voicemail for his brother, [name], to call me to discuss his rehab options. I will follow up after that discussion.  Danne Baxter, RN, MSN Rehab Admissions Coordinator [phone] 08/16/2021 3:20 PM

## 2021-08-16 NOTE — Progress Notes (Signed)
Physical Therapy Treatment Patient Details Name: Jerry PickingJoseph E Tabor MRN: [id number] DOB: Nov 04, 1960 Today's Date: 08/16/2021   History of Present Illness Pt is a 61 y/o male presenting on 1/22 with L sided weakness, facial droop and dysarthria.  CT head with R subcoritcal hemorrhage and midline shift. PMH includes: ETOH abuse, HTN, seizures.    PT Comments    Pt demonstrating good progress today.  He demonstrated improved L LE strength and awareness, but does still require mod A x 2, max cues, and has overall decreased awareness of deficits.  Pt with heavy L lean that improved some with balance training but difficulty maintaining.  Do feel that pt could potentially benefit from intensive rehab due to high PLOF and good progress; however, do realize barriers of decreased support and no insurance. If AIR not option, consider SNF.   Pt did have c/o R knee pain with some edema and erythema - notified MD via secure chat.    Recommendations for follow up therapy are one component of a multi-disciplinary discharge planning process, led by the attending physician.  Recommendations may be updated based on patient status, additional functional criteria and insurance authorization.  Follow Up Recommendations  Acute inpatient rehab (3hours/day)     Assistance Recommended at Discharge Frequent or constant Supervision/Assistance  Patient can return home with the following Two people to help with walking and/or transfers;Two people to help with bathing/dressing/bathroom;Assistance with feeding;Assist for transportation;Help with stairs or ramp for entrance;Assistance with cooking/housework   Equipment Recommendations  Other (comment) (defer to post acute)    Recommendations for Other Services       Precautions / Restrictions Precautions Precautions: Fall Precaution Comments: BP <160     Mobility  Bed Mobility Overal bed mobility: Needs Assistance Bed Mobility: Supine to Sit     Supine to sit:  Min assist     General bed mobility comments: Cues for L UE, heavy use of bed rail using R UE, cues to scoot forward, min A to rise and steady    Transfers Overall transfer level: Needs assistance Equipment used: Rolling walker (2 wheels) Transfers: Sit to/from Stand Sit to Stand: +2 physical assistance, Mod assist           General transfer comment: Performed sit to stand x 3 during session with mod cues for feet and hand positioning with pushing up through UE encouraged.  Min A x 2 to rise but mod A to  steady (leans L)    Ambulation/Gait Ambulation/Gait assistance: Mod assist, +2 physical assistance Gait Distance (Feet): 12 Feet Assistive device: Rolling walker (2 wheels) Gait Pattern/deviations: Step-to pattern, Decreased stride length, Decreased dorsiflexion - left Gait velocity: decreased     General Gait Details: Pt tending to drag L LE behing - max cues to step/kick L leg forward and pt with difficulty doing this, requiring assist to keep L UE on RW, heavy L lean requiring mod A of 2 for this and RW managment, fatigued easily   Stairs             Wheelchair Mobility    Modified Rankin (Stroke Patients Only) Modified Rankin (Stroke Patients Only) Pre-Morbid Rankin Score: No symptoms Modified Rankin: Moderately severe disability     Balance Overall balance assessment: Needs assistance Sitting-balance support: No upper extremity supported, Feet supported Sitting balance-Leahy Scale: Fair Sitting balance - Comments: Could static sit with close supervision   Standing balance support: Bilateral upper extremity supported, During functional activity Standing balance-Leahy Scale: Poor Standing balance  comment: Heavy L lean requiring mod to mod A of 2 ; Worked standing at sink with bil UE support and in front of mirror; ~1-2 mins at a time; cues to stand straight and look in mirror, trying to overcompesate by leaning R (pt stating, I don't want to fall - feels  like he is falling when coming to right even though he is not), did best with tactile cues to stick out R hip then R shoulders but could not maintain more than 1-2 seconds.                            Cognition Arousal/Alertness: Awake/alert Behavior During Therapy: WFL for tasks assessed/performed Overall Cognitive Status: Impaired/Different from baseline Area of Impairment: Orientation, Attention, Memory, Following commands, Safety/judgement, Awareness, Problem solving                 Orientation Level: Disoriented to, Time Current Attention Level: Sustained Memory: Decreased short-term memory Following Commands: Follows one step commands consistently, Follows one step commands with increased time, Follows multi-step commands inconsistently Safety/Judgement: Decreased awareness of deficits, Decreased awareness of safety Awareness: Emergent Problem Solving: Slow processing, Difficulty sequencing, Requires verbal cues, Requires tactile cues General Comments: Pt with some L neglect/inattention requiring cues.  Pt also with decreased awareness into deficits - "I just need to get back to my daily routine, I walk everywhere, then I'll get better."  Required mod-max multimodal cues        Exercises Other Exercises Other Exercises: L LE exercises to focus on coordination: 10x1 toe tapping together and alternation; 10x1 heel/shin    General Comments General comments (skin integrity, edema, etc.): VSS; Noted erythema and edema in R knee mostly subpatella - pt reports chornic arthritits in knee and possible hx of gout but poor historian  - notified MD via secure chat      Pertinent Vitals/Pain Pain Assessment Pain Assessment: No/denies pain    Home Living                          Prior Function            PT Goals (current goals can now be found in the care plan section) Progress towards PT goals: Progressing toward goals    Frequency    Min  4X/week      PT Plan Discharge plan needs to be updated    Co-evaluation PT/OT/SLP Co-Evaluation/Treatment: Yes Reason for Co-Treatment: Complexity of the patient's impairments (multi-system involvement);For patient/therapist safety (lack of tech time) PT goals addressed during session: Mobility/safety with mobility;Balance OT goals addressed during session: ADL's and self-care      AM-PAC PT "6 Clicks" Mobility   Outcome Measure  Help needed turning from your back to your side while in a flat bed without using bedrails?: A Little Help needed moving from lying on your back to sitting on the side of a flat bed without using bedrails?: A Little Help needed moving to and from a bed to a chair (including a wheelchair)?: Total Help needed standing up from a chair using your arms (e.g., wheelchair or bedside chair)?: Total Help needed to walk in hospital room?: Total Help needed climbing 3-5 steps with a railing? : Total 6 Click Score: 10    End of Session Equipment Utilized During Treatment: Gait belt Activity Tolerance: Patient tolerated treatment well Patient left: with chair alarm set;in chair;with call bell/phone within reach (arm  supported with pillow) Nurse Communication: Mobility status PT Visit Diagnosis: Unsteadiness on feet (R26.81);Muscle weakness (generalized) (M62.81);Difficulty in walking, not elsewhere classified (R26.2)     Time: 7564-3329 PT Time Calculation (min) (ACUTE ONLY): 28 min  Charges:  $Neuromuscular Re-education: 8-22 mins                     Anise Salvo, PT Acute Rehab Services Pager [phone] [facility] [phone]    Rayetta Humphrey 08/16/2021, 1:55 PM

## 2021-08-16 NOTE — Progress Notes (Signed)
Inpatient Rehab Admissions Coordinator:   Per updated therapy recommendations, pt was screened for CIR by Estill Dooms, PT, DPT.  At this time we are recommending a CIR consult and I will place an order per our protocol.  Note that caregiver support will need to be confirmed for full consideration for CIR.   Estill Dooms, PT, DPT Admissions Coordinator [phone] 08/16/21  2:11 PM

## 2021-08-16 NOTE — Progress Notes (Signed)
FPTS Brief Progress Note  S: Patient was sleeping when I went to evaluate him.  I did not wake him.  O: BP (!) 145/97 (BP Location: Right Arm)    Pulse 70    Temp 98 F (36.7 C) (Oral)    Resp 17    Ht 5\' 8"  (1.727 m)    Wt 79.9 kg    SpO2 96%    BMI 26.78 kg/m   General: 61 year old male, resting comfortably in bed Respiratory: Normal work of breathing Cardiac: Regular rate  A/P: Stroke Sleeping comfortably on evaluation.  We will continue day team management. - Continue PT/OT/speech therapy - Continue neurochecks every 4 hours - Continue Crestor 20 mg - Patient will need repeat MRI in the outpatient setting  Hyponatremia Sodium increased to 132 from 130 at previous check. - We will continue to monitor and continue maintenance IV fluids at this time. - Morning BMP - Adjust fluids as needed  - Orders reviewed. Labs for AM ordered, which was adjusted as needed.   77, MD 08/16/2021, 11:17 PM PGY-3, [facility] Night Resident  Please page [phone] with questions.

## 2021-08-16 NOTE — Progress Notes (Signed)
°  Progress Note   Date: 08/16/2021  Patient Name: Jerry Humphrey        MRN#: [id number]   The diagnostic studies of the brain supports a diagnosis of:   Cerebral edema is present and is not being treated or monitored (per neurology)- not clinically significant No worsening of his neurologic status.    Nile Dear

## 2021-08-16 NOTE — Progress Notes (Signed)
Family Medicine Teaching Service Daily Progress Note Intern Pager: [phone]  Patient name: Jerry Humphrey Medical record number: [id number] Date of birth: 1960-08-11 Age: 61 y.o. Gender: male  Primary Care Provider: Pcp, No Consultants: Neurology Code Status: Full  Pt Overview and Major Events to Date:  08/13/21 - Patient admitted  Assessment and Plan:  Jerry Humphrey is a 61 y.o. male presenting with Left side weakness . PMH is significant for seizure due to alcohol withdrawal, HTN, Alcohol use and Tobacco use    Left side weakness   Hemorrhagic stroke Patient stable and awaiting SNF. He continues to have left sided weakness in his UE, with dysarthria. Will reach out to SW about placement status.  -PT/OT/SLP -Neuro checks q4h -Continue Crestor 20 mg -Repeat MRI head in outpatient setting, once hematoma resolves  Hyponatremia Patient Na has dropped from 132 to 127 today. Patient was supposed to be receiving NS IVF but they had not been continued by nursing. Etiology of this low NA is unknown. We will evaluate patient urine/serum osmoles, and check his urine NA. Repeat BMP demonstrated improved NA to 130.  -Check Urine/Serum osm -Check Urine NA -Restart NS mIVF @ 125 ml/hr  HTN BP ranges have been stable between 130-140's/80-90's.  -Continue to monitor  AKI (resolved) Cr today 0.93 from 1.01 yesterday. GFR > 60  Hx of Alcohol use  Hx of alcohol withdrawal with seizure CIWA last recorded yesterday at 8 am , 0 -d/c CIWA  Hx of Tobacco use FEN/GI: Dysphagia 3 diet PPx: SCDs Dispo:SNF in 2-3 days.    Subjective:  Patient complaining of knee pain today, says it's been hurting since admission. Patient pain worse with flexion of joint, rated 8/10, not relieved by straightening.   Objective: Temp:  [98 F (36.7 C)-98.9 F (37.2 C)] 98.4 F (36.9 C) (01/25 0304) Pulse Rate:  [63-73] 63 (01/25 0304) Resp:  [13-21] 20 (01/25 0304) BP: (119-141)/(74-92) 141/92 (01/25  0304) SpO2:  [94 %-98 %] 97 % (01/25 0304) Physical Exam: General: well appearing, alert, conversational, NAD, white male Cardiovascular: RRR, NRMG Respiratory: CTABL Abdomen: Soft, NTTP, non-distended Extremities: Moving all extremities independently, no edema or erythema, knee joint with normal ROM, slight TTP below patella  Laboratory: Recent Labs  Lab 08/13/21 1830 08/13/21 1839 08/14/21 0435  WBC 15.3*  --  13.6*  HGB 16.6 17.7* 16.2  HCT 49.7 52.0 46.6  PLT 330  --  283   Recent Labs  Lab 08/13/21 1830 08/13/21 1839 08/14/21 0435 08/15/21 1339 08/16/21 0352  NA 132*   < > 133* 132* 127*  K 3.9   < > 4.0 4.0 3.7  CL 94*   < > 100 99 97*  CO2 26  --  23 24 24   BUN 37*   < > 34* 22 17  CREATININE 1.28*   < > 1.28* 1.01 0.93  CALCIUM 9.2  --  8.6* 8.7* 8.4*  PROT 8.0  --   --   --   --   BILITOT 1.9*  --   --   --   --   ALKPHOS 55  --   --   --   --   ALT 74*  --   --   --   --   AST 64*  --   --   --   --   GLUCOSE 102*   < > 120* 117* 101*   < > = values in this interval not displayed.  Imaging/Diagnostic Tests:   Holley Bouche, MD 08/16/2021, 6:16 AM PGY-1, [place] Intern pager: [phone], text pages welcome

## 2021-08-16 NOTE — Progress Notes (Signed)
°  Progress Note   Date: 08/16/2021  Patient Name: Jerry Humphrey        MRN#: [id number]  Review the patients clinical findings supports the diagnosis of:   Hyponatremia  Unclear if acute or chronic - previous hx of hyponatremia with resolution of low sodium x 1 prior to this admission. Likely beer potomania, ?? SIADH Start IVF [place] @ 125 cc/hr Recheck Bmet in about 4 hours. Check Serum and urine osm, urine Na/FeNa Monitor neurologic symptoms closely

## 2021-08-16 NOTE — TOC CAGE-AID Note (Signed)
Transition of Care [facility]) - CAGE-AID Screening   Patient Details  Name: DARRALL STREY MRN: [id number] Date of Birth: August 30, 1960  [facility] CM/SW Contact:    Prathik Aman C Tarpley-Carter, LCSWA Phone Number: 08/16/2021, 10:17 AM   Clinical Narrative: Pt participated in Cage-Aid.  Pt stated he does not use substance, but drinks ETOH.  Pt was offered resources due to usage of ETOH.     Tramell Piechota Tarpley-Carter, MSW, LCSW-A Pronouns:  She/Her/Hers [place] Transitions of Care Clinical Social Worker Direct Number:  [phone] [email or web address]  CAGE-AID Screening:    Have You Ever Felt You Ought to Cut Down on Your Drinking or Drug Use?: Yes Have People Annoyed You By Critizing Your Drinking Or Drug Use?: No Have You Felt Bad Or Guilty About Your Drinking Or Drug Use?: No Have You Ever Had a Drink or Used Drugs First Thing In The Morning to Steady Your Nerves or to Get Rid of a Hangover?: No CAGE-AID Score: 1  Substance Abuse Education Offered: Yes  Substance abuse interventions: Transport planner

## 2021-08-16 NOTE — Progress Notes (Signed)
Occupational Therapy Treatment Patient Details Name: Weldon PickingJoseph E Koehne MRN: [id number] DOB: 1961/01/22 Today's Date: 08/16/2021   History of present illness Pt is a 61 y/o male presenting on 1/22 with L sided weakness, facial droop and dysarthria.  CT head with R subcoritcal hemorrhage and midline shift. PMH includes: ETOH abuse, HTN, seizures.   OT comments  Pt with increased activity tolerance and strength this session. Pt working on functional mobility, as well as standing at the sink for grooming tasks. This session highly focused on maintaining midline in standing. He completed exercises in front of the mirror standing, leaning far R and attempting to maintain R lean, however quickly leaning L again. Pt able to report that he was leaning L when looking in the mirror, however had difficulties correcting the lean. Continuing to require mod A +2 for functional mobility, however improving with activity tolerance and ability to ambulate/weight bear through LLE. Updated recommendation to AIR, as pt is showing improvements in mobility and ability to tolerate longer sessions and activity. OT will continue to follow acutely.    Recommendations for follow up therapy are one component of a multi-disciplinary discharge planning process, led by the attending physician.  Recommendations may be updated based on patient status, additional functional criteria and insurance authorization.    Follow Up Recommendations  Acute inpatient rehab (3hours/day)    Assistance Recommended at Discharge Frequent or constant Supervision/Assistance  Patient can return home with the following  A lot of help with walking and/or transfers;A lot of help with bathing/dressing/bathroom;Assistance with cooking/housework;Assistance with feeding;Direct supervision/assist for medications management;Direct supervision/assist for financial management;Assist for transportation;Help with stairs or ramp for entrance   Equipment  Recommendations  Other (comment) (TBD)    Recommendations for Other Services Rehab consult    Precautions / Restrictions Precautions Precautions: Fall Precaution Comments: BP <160 Restrictions Weight Bearing Restrictions: No       Mobility Bed Mobility Overal bed mobility: Needs Assistance Bed Mobility: Supine to Sit     Supine to sit: Min assist     General bed mobility comments: Cues for L UE, heavy use of bed rail using R UE, cues to scoot forward, min A to rise and steady    Transfers Overall transfer level: Needs assistance Equipment used: Rolling walker (2 wheels) Transfers: Sit to/from Stand Sit to Stand: +2 physical assistance, Mod assist           General transfer comment: Performed sit to stand x 3 during session with mod cues for feet and hand positioning with pushing up through UE encouraged.  Min A x 2 to rise but mod A to  steady (leans L)     Balance Overall balance assessment: Needs assistance Sitting-balance support: No upper extremity supported, Feet supported Sitting balance-Leahy Scale: Fair Sitting balance - Comments: Could static sit with close supervision Postural control: Left lateral lean Standing balance support: Bilateral upper extremity supported, During functional activity Standing balance-Leahy Scale: Poor Standing balance comment: Heavy L lean requiring mod to mod A of 2 ; Worked standing at sink with bil UE support and in front of mirror; ~1-2 mins at a time; cues to stand straight and look in mirror, trying to overcompesate by leaning R (pt stating, I don't want to fall - feels like he is falling when coming to right even though he is not), did best with tactile cues to stick out R hip then R shoulders but could not maintain more than 1-2 seconds.  ADL either performed or assessed with clinical judgement   ADL Overall ADL's : Needs assistance/impaired     Grooming: Maximal  assistance;Standing Grooming Details (indicate cue type and reason): Attempting to brush teeth and wash face at sink, pt unable to maintain standing balance to complete task. Quickly fatigued and deferred task in sitting.                 Toilet Transfer: Moderate assistance;+2 for physical assistance;+2 for safety/equipment Toilet Transfer Details (indicate cue type and reason): Pt taking steps forwards, requiring mod A +2 to maintain upright balance and attend to L side         Functional mobility during ADLs: Moderate assistance;+2 for physical assistance;+2 for safety/equipment General ADL Comments: Pt requiring increased assist as pt is unable to maintain upright balance.    Extremity/Trunk Assessment              Vision       Perception     Praxis      Cognition Arousal/Alertness: Awake/alert Behavior During Therapy: WFL for tasks assessed/performed Overall Cognitive Status: Impaired/Different from baseline Area of Impairment: Orientation, Attention, Memory, Following commands, Safety/judgement, Awareness, Problem solving                 Orientation Level: Disoriented to, Time Current Attention Level: Sustained Memory: Decreased short-term memory Following Commands: Follows one step commands consistently, Follows one step commands with increased time, Follows multi-step commands inconsistently Safety/Judgement: Decreased awareness of deficits, Decreased awareness of safety Awareness: Emergent Problem Solving: Slow processing, Difficulty sequencing, Requires verbal cues, Requires tactile cues General Comments: Pt with some L neglect/inattention requiring cues.  Pt also with decreased awareness into deficits - "I just need to get back to my daily routine, I walk everywhere, then I'll get better."  Required mod-max multimodal cues        Exercises Exercises: Other exercises Other Exercises Other Exercises: Working on R leans to reduce L leans in  standing.    Shoulder Instructions       General Comments VSS on RA    Pertinent Vitals/ Pain       Pain Assessment Pain Assessment: No/denies pain  Home Living                                          Prior Functioning/Environment              Frequency  Min 2X/week        Progress Toward Goals  OT Goals(current goals can now be found in the care plan section)  Progress towards OT goals: Progressing toward goals  Acute Rehab OT Goals Patient Stated Goal: To get stronger OT Goal Formulation: With patient Time For Goal Achievement: 08/28/21 Potential to Achieve Goals: Fair ADL Goals Pt Will Perform Grooming: with supervision;sitting Pt Will Perform Upper Body Bathing: with supervision;sitting;with set-up Pt Will Perform Lower Body Dressing: with min assist;sitting/lateral leans;sit to/from stand Pt Will Transfer to Toilet: with min assist;ambulating;bedside commode Pt/caregiver will Perform Home Exercise Program: Increased strength;Left upper extremity;With written HEP provided;Increased ROM Additional ADL Goal #1: Pt will locate 3 items on L side of table without cueing, using compensatory techniques as needed.  Plan Discharge plan remains appropriate;Frequency remains appropriate    Co-evaluation    PT/OT/SLP Co-Evaluation/Treatment: Yes Reason for Co-Treatment: Complexity of the patient's impairments (multi-system involvement);For patient/therapist safety PT goals addressed  during session: Mobility/safety with mobility;Balance OT goals addressed during session: ADL's and self-care;Strengthening/ROM      AM-PAC OT "6 Clicks" Daily Activity     Outcome Measure   Help from another person eating meals?: A Little Help from another person taking care of personal grooming?: A Lot Help from another person toileting, which includes using toliet, bedpan, or urinal?: A Lot Help from another person bathing (including washing, rinsing, drying)?:  A Lot Help from another person to put on and taking off regular upper body clothing?: A Lot Help from another person to put on and taking off regular lower body clothing?: A Lot 6 Click Score: 13    End of Session Equipment Utilized During Treatment: Gait belt;Rolling walker (2 wheels)  OT Visit Diagnosis: Other abnormalities of gait and mobility (R26.89);Muscle weakness (generalized) (M62.81);Hemiplegia and hemiparesis;Other symptoms and signs involving cognitive function Hemiplegia - Right/Left: Left Hemiplegia - dominant/non-dominant: Non-Dominant Hemiplegia - caused by: Nontraumatic intracerebral hemorrhage   Activity Tolerance Patient tolerated treatment well   Patient Left in chair;with call bell/phone within reach;with chair alarm set   Nurse Communication Mobility status        Time: 1610-9604 OT Time Calculation (min): 26 min  Charges: OT General Charges $OT Visit: 1 Visit OT Treatments $Therapeutic Activity: 8-22 mins  Ronnita Paz H., OTR/L Acute Rehabilitation  Bryn Perkin Elane Bing Plume 08/16/2021, 3:54 PM

## 2021-08-16 NOTE — Progress Notes (Addendum)
STROKE TEAM PROGRESS NOTE   INTERVAL HISTORY No family at bedside. Patient was seen and assessed at the bedside. No acute events since last night. Patient denies any emergent neurological deficits at this time. Patient left sided weakness is improving. Facial droop still present.  Vitals:   08/15/21 2020 08/15/21 2325 08/16/21 0304 08/16/21 0847  BP: 134/83 133/90 (!) 141/92 (!) 137/93  Pulse: 68 71 63 75  Resp: 13 (!) 21 20 20   Temp: 98.5 F (36.9 C) 98 F (36.7 C) 98.4 F (36.9 C) 98.6 F (37 C)  TempSrc: Oral Oral Oral Oral  SpO2: 97% 98% 97% 98%  Weight:      Height:       CBC:  Recent Labs  Lab 08/13/21 1830 08/13/21 1839 08/14/21 0435  WBC 15.3*  --  13.6*  NEUTROABS 10.3*  --   --   HGB 16.6 17.7* 16.2  HCT 49.7 52.0 46.6  MCV 85.2  --  83.7  PLT 330  --  283    Basic Metabolic Panel:  Recent Labs  Lab 08/14/21 0435 08/15/21 1339 08/16/21 0352  NA 133* 132* 127*  K 4.0 4.0 3.7  CL 100 99 97*  CO2 23 24 24   GLUCOSE 120* 117* 101*  BUN 34* 22 17  CREATININE 1.28* 1.01 0.93  CALCIUM 8.6* 8.7* 8.4*  MG 2.3  --   --   PHOS 3.6  --   --     Lipid Panel:  Recent Labs  Lab 08/14/21 0435  CHOL 214*  TRIG 134  HDL 28*  CHOLHDL 7.6  VLDL 27  LDLCALC *    HgbA1c:  Recent Labs  Lab 08/14/21 0435  HGBA1C 6.0*    Urine Drug Screen:  Recent Labs  Lab 08/14/21 1647  LABOPIA NONE DETECTED  COCAINSCRNUR NONE DETECTED  LABBENZ NONE DETECTED  AMPHETMU NONE DETECTED  THCU POSITIVE*  LABBARB NONE DETECTED     Alcohol Level No results for input(s): ETH in the last 168 hours.  IMAGING past 24 hours No results found.  PHYSICAL EXAM  Temp:  [98 F (36.7 C)-98.9 F (37.2 C)] 98.6 F (37 C) (01/25 0847) Pulse Rate:  [63-75] 75 (01/25 0847) Resp:  [13-21] 20 (01/25 0847) BP: (119-141)/(74-93) 137/93 (01/25 0847) SpO2:  [95 %-98 %] 98 % (01/25 0847)  General - Well nourished, well developed, in no apparent distress.  Cardiovascular -  Regular rhythm and rate.  Mental Status -  Level of arousal and orientation to time, place, and person were intact. Language including naming, repetition, comprehension was assessed and found intact. Patient continues to be dysarthric.  Attention span and concentration were normal. Recent and remote memory were intact. Fund of Knowledge was assessed and was intact.  Cranial Nerves II - XII - II - Visual field intact OU. III, IV, VI - Extraocular movements intact. V - Facial sensation intact bilaterally. VII - Left facial droop. VIII - Hearing & vestibular intact bilaterally. X - Palate elevates symmetrically. XI - Chin turning & shoulder shrug intact bilaterally. XII - Tongue protrusion intact.  Motor Strength - LUE weakness improving, able to perform antigravity movements. LLE able to left against gravity but drift noted compared to RLE.   Motor Tone - Muscle tone was assessed at the neck and appendages and was normal.  Reflexes - The patients reflexes were symmetrical in all extremities and he had no pathological reflexes.  Sensory - Left sided numbness but reports it is improving.   Coordination -  The patient had normal movements in the hands and feet with no ataxia or dysmetria.  Tremor was absent.  Gait and Station - deferred.  ASSESSMENT/PLAN [name] is a 61 y.o. male with history of alcohol use, tobacco use, HTN, and alcohol withdrawal seizures presenting with 4 day history of left sided weakness.   Stroke: R BG subcortical hemorrhage, etiology not quite certain, but likely related to HTN and alcohol use CT head: Large right intraparenchymal hemorrhage in lentiform nuclei with mass effect on R lateral ventricle with 0.4 cm R to L midline shift. Underlying small vessel white matter disease, Focal encephalomalacia of anterior left frontal lobe   CTA head & neck negative for LVO. Atheromatous disease in carotid bifurcation and carotid sinus.  MRI  biconvex  intra-axial hemorrhage in R basal ganaglia estimated blood volume 29 mL. Surrounding edema and regional mass effect including 4 mm left midline shift, advanced chronic small vessel disease Repeat CT performed at 2PM today shows stable R basal ganlia hemorrhage and 4 mm leftward midline shift  Need to repeat MRI and CTA head and neck once hematoma resolves as outpt 2D Echo EF 60-65% LDL 159 HgbA1c 5.3 VTE prophylaxis - SCDs No antithrombotic prior to admission, now on No antithrombotic. Therapy recommendations: SNF Disposition:  pending  Hypertension Home meds:  none Stable BP goal < 160 Long-term BP goal normotensive  Hyperlipidemia Home meds:  none LDL 159, goal < 70 On crestor 20 Continue statin on discharge.   Other Stroke Risk Factors Advanced Age >/= 70  Cigarette smoker advised to stop smoking ETOH use, alcohol level 170, advised to drink no more than 1-2 drink(s) a day  Other Active Problems AKI Alcohol use Disorder: on CIWA w/ ativan  Hospital day # 3  Park Pope, MD PGY1 Resident  ATTENDING NOTE: I reviewed above note and agree with the assessment and plan. Pt was seen and examined.   No family is at the bedside. Pt lying in bed, left UE weakness has improved. He was able to lift against gravity and 3/5 proximal and distal now. LLE 5/5. Still has decreased sensation on the left face and left arm. Left facial droop also improved. He does need to repeat MRI and CTA head and neck once hematoma resolves, likely at time of follow up as outpt. Continue statin on discharge. PT/OT recommend SNF.   For detailed assessment and plan, please refer to above as I have made changes wherever appropriate.   Neurology will sign off. Please call with questions. Pt will follow up with stroke clinic NP at [facility] in about 4 weeks. Thanks for the consult.   Marvel Plan, MD PhD Stroke Neurology 08/16/2021 10:33 AM   To contact Stroke Continuity provider, please refer to  [email or web address]. After hours, contact General Neurology

## 2021-08-16 NOTE — Progress Notes (Signed)
Responded to consult for IV. Per RN, no longer needed. Pt has IV.

## 2021-08-17 ENCOUNTER — Inpatient Hospital Stay (HOSPITAL_COMMUNITY): Payer: Medicaid Other

## 2021-08-17 DIAGNOSIS — I619 Nontraumatic intracerebral hemorrhage, unspecified: Secondary | ICD-10-CM

## 2021-08-17 DIAGNOSIS — M25561 Pain in right knee: Secondary | ICD-10-CM

## 2021-08-17 DIAGNOSIS — M25569 Pain in unspecified knee: Secondary | ICD-10-CM

## 2021-08-17 LAB — CBC
HCT: 40.7 % (ref 39.0–52.0)
Hemoglobin: 13.7 g/dL (ref 13.0–17.0)
MCH: 28.2 pg (ref 26.0–34.0)
MCHC: 33.7 g/dL (ref 30.0–36.0)
MCV: 83.9 fL (ref 80.0–100.0)
Platelets: 241 10*3/uL (ref 150–400)
RBC: 4.85 MIL/uL (ref 4.22–5.81)
RDW: 12.1 % (ref 11.5–15.5)
WBC: 8 10*3/uL (ref 4.0–10.5)
nRBC: 0 % (ref 0.0–0.2)

## 2021-08-17 LAB — BASIC METABOLIC PANEL
Anion gap: 7 (ref 5–15)
Anion gap: 7 (ref 5–15)
BUN: 16 mg/dL (ref 8–23)
BUN: 13 mg/dL (ref 8–23)
CO2: 22 mmol/L (ref 22–32)
CO2: 23 mmol/L (ref 22–32)
Calcium: 8.4 mg/dL — ABNORMAL LOW (ref 8.9–10.3)
Calcium: 8.8 mg/dL — ABNORMAL LOW (ref 8.9–10.3)
Chloride: 104 mmol/L (ref 98–111)
Chloride: 103 mmol/L (ref 98–111)
Creatinine, Ser: 0.93 mg/dL (ref 0.61–1.24)
Creatinine, Ser: 0.99 mg/dL (ref 0.61–1.24)
GFR, Estimated: >60 mL/min (ref >60)
GFR, Estimated: >60 mL/min (ref >60)
Glucose, Bld: 99 mg/dL (ref 70–99)
Glucose, Bld: 119 mg/dL — ABNORMAL HIGH (ref 70–99)
Potassium: 4.3 mmol/L (ref 3.5–5.1)
Potassium: 4.3 mmol/L (ref 3.5–5.1)
Sodium: 133 mmol/L — ABNORMAL LOW (ref 135–145)
Sodium: 133 mmol/L — ABNORMAL LOW (ref 135–145)

## 2021-08-17 IMAGING — CR DG KNEE 1-2V*R*
2 series · 2 of 2 positions shown · non-contrast
Comparison: Right ankle done on [DATE]

CLINICAL DATA: Pain right knee

EXAM:
RIGHT KNEE - 1-2 VIEW

[knee ap]
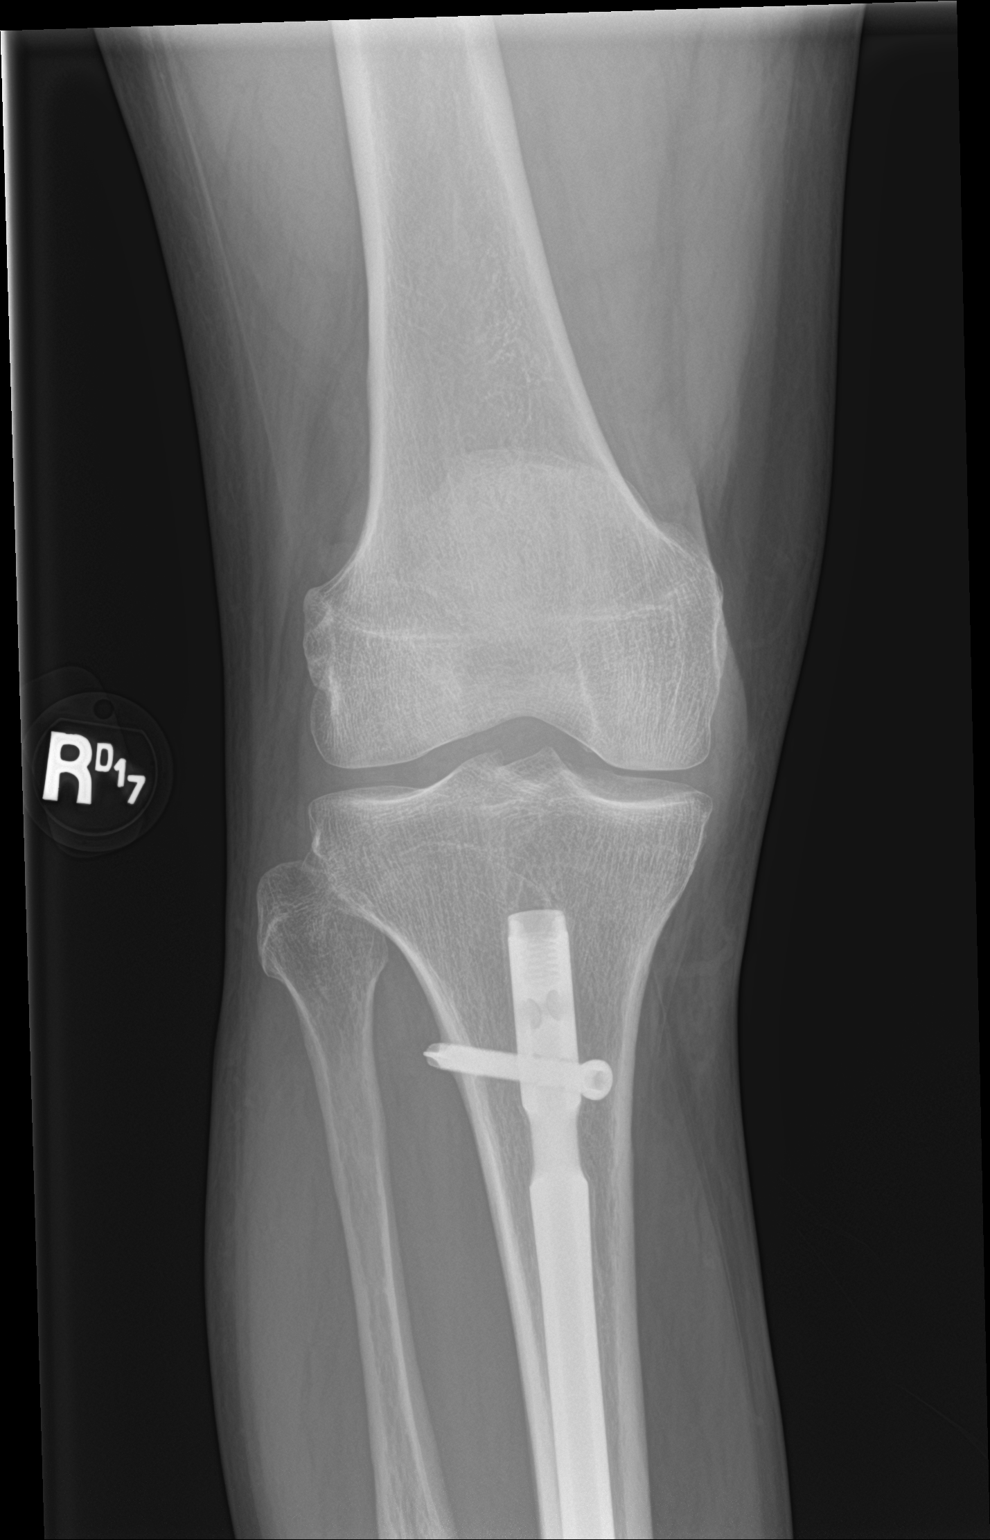

[knee lat]
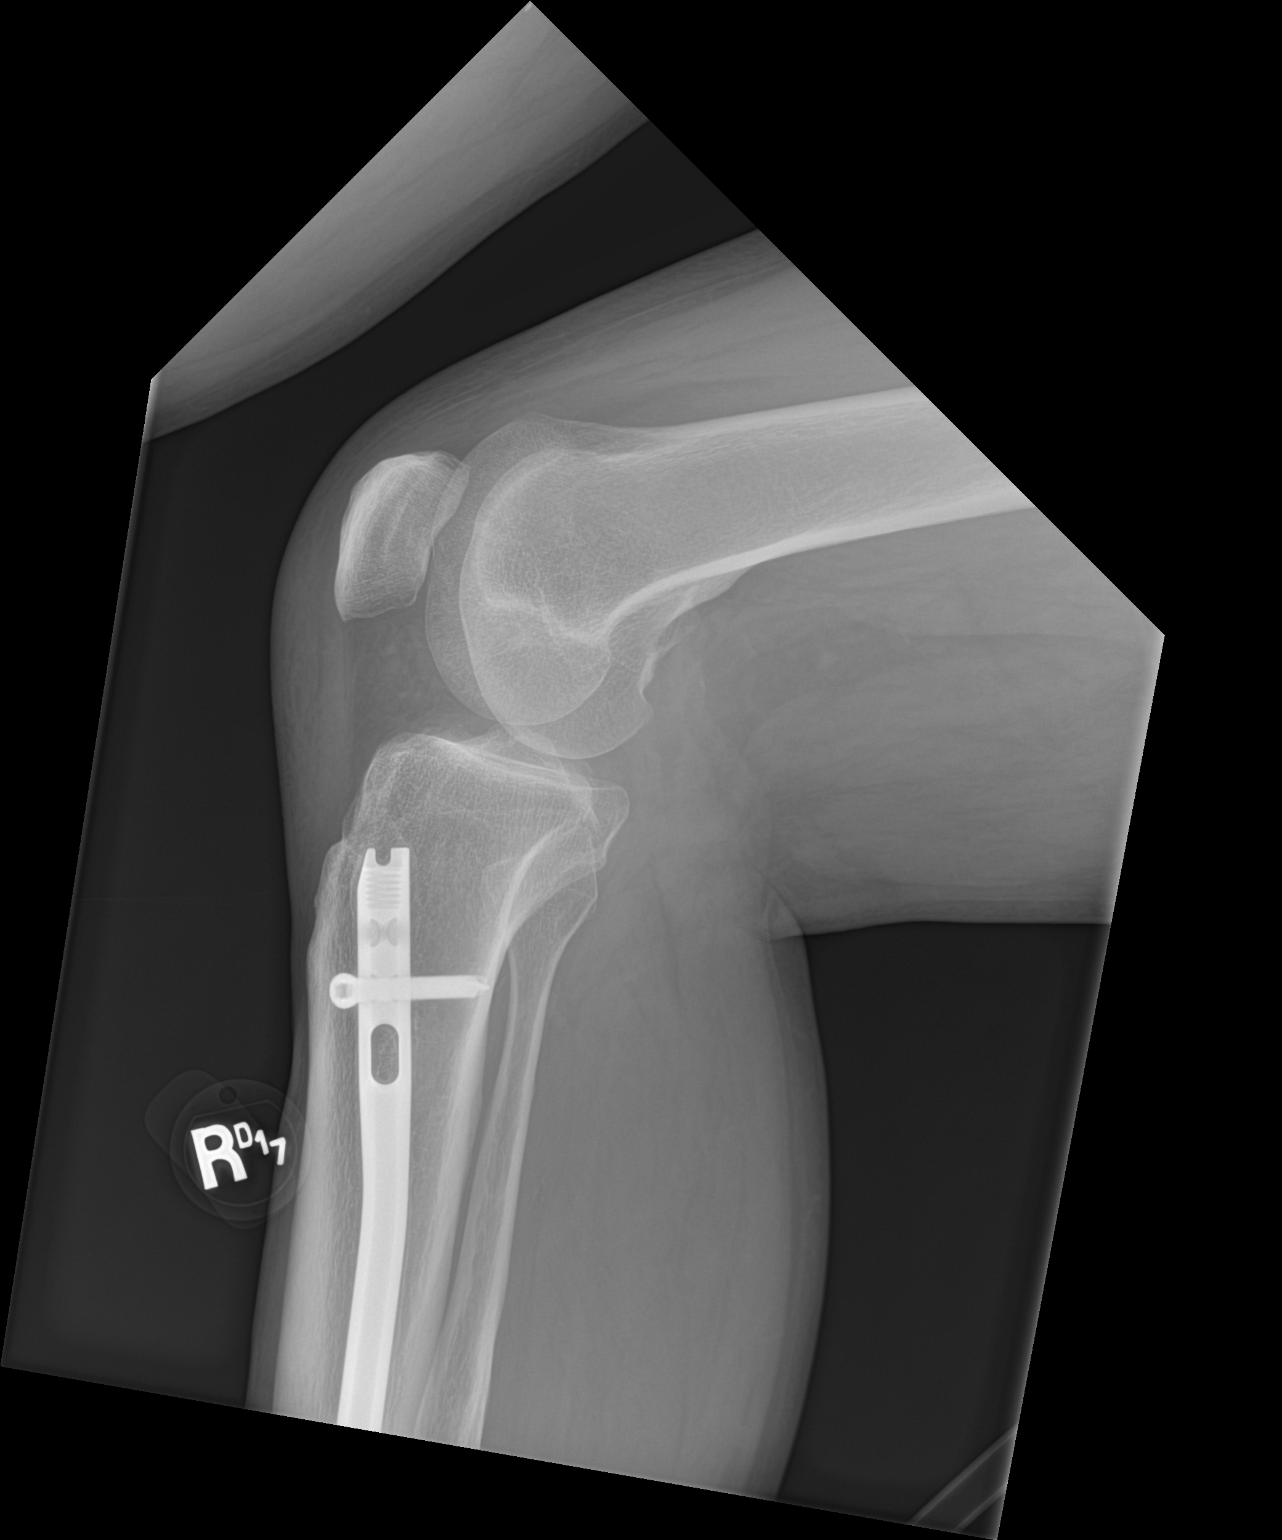

[2 of 2 positions shown; findings below may reference images not displayed]

FINDINGS: No recent fracture or dislocation is seen. There is no significant
effusion in the suprapatellar bursa. There is soft tissue swelling
along the anterior aspect of patella and infrapatellar tendon. There
is previous internal fixation in the right tibia with intramedullary
rod.
IMPRESSION: No recent fracture or dislocation is seen. There is no significant
effusion in the right knee joint. There is soft tissue swelling
along the anterior aspect of the knee, possibly suggesting contusion
or cellulitis.

## 2021-08-17 MED ORDER — AMLODIPINE BESYLATE 10 MG PO TABS
10.0000 mg | ORAL_TABLET | Freq: Every day | ORAL | Status: DC
  Administered 2021-08-17 – 2021-09-01 (×16): 10 mg via ORAL
  Filled 2021-08-17 (×16): qty 1

## 2021-08-17 MED ORDER — AMLODIPINE BESYLATE 5 MG PO TABS: 5.0000 mg | ORAL_TABLET | Freq: Every day | ORAL | Status: DC

## 2021-08-17 MED ORDER — MELATONIN 3 MG PO TABS
3.0000 mg | ORAL_TABLET | Freq: Every day | ORAL | Status: DC
  Administered 2021-08-17 – 2021-08-21 (×5): 3 mg via ORAL
  Filled 2021-08-17 (×5): qty 1

## 2021-08-17 NOTE — Progress Notes (Signed)
Physical Therapy Treatment Patient Details Name: Jerry Humphrey MRN: [id number] DOB: October 26, 1960 Today's Date: 08/17/2021   History of Present Illness Pt is a 61 y/o male presenting on 1/22 with L sided weakness, facial droop and dysarthria.  CT head with R subcoritcal hemorrhage and midline shift. PMH includes: ETOH abuse, HTN, seizures.    PT Comments    Pt stood up and ambulated in the room with max assist. He requires a lot of cuing for motor tasks involving putting weight into his R LE and moving his L LE with functional tasks. He also required cuing to refocus on the task at hand. During treatment session patient showed deficits in strength, endurance, activity tolerance, and cognition. Changing recommendation for therapy services post acute to skilled nursing facility to address the previously stated deficits since patient does not have anyone at home to take care of him and he does not meet the requirements of CIR. Will continue to follow acutely to maximize functional mobility, independence and safety.   Recommendations for follow up therapy are one component of a multi-disciplinary discharge planning process, led by the attending physician.  Recommendations may be updated based on patient status, additional functional criteria and insurance authorization.  Follow Up Recommendations  Skilled nursing-short term rehab (<3 hours/day)     Assistance Recommended at Discharge Frequent or constant Supervision/Assistance  Patient can return home with the following Two people to help with walking and/or transfers;Two people to help with bathing/dressing/bathroom;Assistance with feeding;Assist for transportation;Help with stairs or ramp for entrance;Assistance with cooking/housework   Equipment Recommendations       Recommendations for Other Services       Precautions / Restrictions Precautions Precautions: Fall Precaution Comments: Monitor BP Restrictions Weight Bearing Restrictions:  No     Mobility  Bed Mobility Overal bed mobility: Needs Assistance Bed Mobility: Supine to Sit     Supine to sit: Min assist, HOB elevated Sit to supine: Mod assist   General bed mobility comments: patient needed simple cues and uesed the bed rail to sit up    Transfers Overall transfer level: Needs assistance Equipment used: Rolling walker (2 wheels) Transfers: Sit to/from Stand Sit to Stand: Max assist                Ambulation/Gait Ambulation/Gait assistance: Max assist Gait Distance (Feet): 12 Feet (patient also walked 8 ft 2x at one of the rails in the halway) Assistive device: Rolling walker (2 wheels) Gait Pattern/deviations: Step-to pattern, Decreased stride length, Decreased dorsiflexion - left Gait velocity: decreased     General Gait Details: Pt tending to drag L LE behing needed cues to draw attention to his L leg. Patient kept pushing left. Patient's fatigue came on suddenly.   Stairs             Wheelchair Mobility    Modified Rankin (Stroke Patients Only) Modified Rankin (Stroke Patients Only) Pre-Morbid Rankin Score: No symptoms Modified Rankin: Moderately severe disability     Balance Overall balance assessment: Needs assistance Sitting-balance support: Feet supported, Single extremity supported Sitting balance-Leahy Scale: Poor Sitting balance - Comments: patient had trouble controlling his trunk when seated at EOB Postural control: Left lateral lean, Posterior lean Standing balance support: Bilateral upper extremity supported, During functional activity Standing balance-Leahy Scale: Poor Standing balance comment: Needed a lot of cuing to put weight into his right leg during weight shifting activity.  Cognition Arousal/Alertness: Awake/alert Behavior During Therapy: WFL for tasks assessed/performed Overall Cognitive Status: Impaired/Different from baseline Area of Impairment: Orientation,  Attention, Memory, Following commands, Safety/judgement, Awareness, Problem solving                       Following Commands: Follows one step commands consistently, Follows one step commands with increased time, Follows multi-step commands inconsistently Safety/Judgement: Decreased awareness of deficits, Decreased awareness of safety Awareness: Emergent Problem Solving: Slow processing, Difficulty sequencing, Requires verbal cues, Requires tactile cues General Comments: Patient has L neglect and requires cues to draw his attention to that side.        Exercises Other Exercises Other Exercises: Standing weight shift onto the right leg    General Comments        Pertinent Vitals/Pain Pain Assessment Pain Assessment: Faces Faces Pain Scale: Hurts a little bit Pain Location: Patient has some arthritis in his R knee Pain Descriptors / Indicators: Aching    Home Living                          Prior Function            PT Goals (current goals can now be found in the care plan section)      Frequency    Min 4X/week      PT Plan Discharge plan needs to be updated    Co-evaluation              AM-PAC PT "6 Clicks" Mobility   Outcome Measure  Help needed turning from your back to your side while in a flat bed without using bedrails?: A Little Help needed moving from lying on your back to sitting on the side of a flat bed without using bedrails?: A Little Help needed moving to and from a bed to a chair (including a wheelchair)?: A Lot Help needed standing up from a chair using your arms (e.g., wheelchair or bedside chair)?: A Lot Help needed to walk in hospital room?: A Lot Help needed climbing 3-5 steps with a railing? : Total 6 Click Score: 13    End of Session Equipment Utilized During Treatment: Gait belt Activity Tolerance: Patient tolerated treatment well;Treatment limited secondary to medical complications (Comment) Patient left: in  bed;with call bell/phone within reach;with bed alarm set Nurse Communication: Mobility status PT Visit Diagnosis: Unsteadiness on feet (R26.81);Muscle weakness (generalized) (M62.81);Other symptoms and signs involving the nervous system (R29.898)     Time: [id number] PT Time Calculation (min) (ACUTE ONLY): 29 min  Charges:  $Gait Training: 8-22 mins $Therapeutic Activity: 8-22 mins                     Quenton Fetter, SPT    Quenton Fetter 08/17/2021, 5:09 PM

## 2021-08-17 NOTE — Progress Notes (Addendum)
Inpatient Rehabilitation Admissions Coordinator   I spoke with patient's brother, Mellody Dance by phone . They live together, but Mellody Dance works out of town . There is no caregiver supports at home to support a CIR admit and it is unlikely that patient will reach independent level in a 2 week CIR stay. Therefore, he will need SNF placement which will be difficult without payor. Disability and Medicaid applications have been initiated per Med [employer] . I will update acute team and TOC. We will sign off .  Ottie Glazier, RN, MSN Rehab Admissions Coordinator [phone] 08/17/2021 8:32 AM

## 2021-08-17 NOTE — Progress Notes (Signed)
Family Medicine Teaching Service Daily Progress Note Intern Pager: [phone]  Patient name: Jerry Humphrey Medical record number: [id number] Date of birth: [birth year] Age: 61 y.o. Gender: male  Primary Care Provider: Pcp, No Consultants: Neurology Code Status: Full  Pt Overview and Major Events to Date:  08/13/21 - Patient admitted  Assessment and Plan:  Jerry Humphrey is a 61 y.o. male presenting with left side weakness, found to have hemorraghic stroke. PMH is significant for seizure due to alcohol withdrawal, HTN, Alcohol use and Tobacco use   Left side weakness   Hemorrhagic stroke Patient with complaints of leg pain... Continues to have left UE weakness with decreased sensation, and facial droop with dysarthria. Patient progressing with PT/OT, and being considered for CIR but lacked caregiver support. Patient being considered for SNF. Patient is medically stable for SNF. -PT/OT/SLP -Neuro checks q4h -Continue crestor 20 mg daily  Hyponatremia Na 129 yesterday raised to 133 today. Patient on NS mIVF at rate of 125 mL/hr. Serum osmalality 278, urine osmolality 400,  Urine sodium 51, unlikely SIADH due to normal serum osmolality (should be low), with high urin osmolality. -D/c IVF -BMP at 4 pm  HTN BP hypertensive overnight, ranges from 140-150's/90-100's. Goal SBP < 160, will initiate antihypertensive.  -Amlodipine 10 mg daily  R. Knee pain Patient complaining of knee pain since hospitalization. R. Knee slightly swollen and warm to touch.  -Voltaren gel 2 g BID prn -Knee X-Ray 1 view  AKI (resolved) Cr 0.93 today, from 1.04 yesterday. Cr appears stable.   Hx of Alcohol use  Hx of alcohol withdrawal with seizure -Stable  Hx of Tobacco use -Stable  FEN/GI: Dysphagia 3 PPx: SCDs Dispo: CIR vs SNF  in 2-3 days.    Subjective:  Patient feeling well today, says he was able to get up and walk around with PT, but tried to get up on his own later and had a fall. No  injury, didn't hit head, was down for 5 min, before someone helped him.  Objective: Temp:  [98 F (36.7 C)-98.9 F (37.2 C)] 98 F (36.7 C) (01/26 0338) Pulse Rate:  [68-75] 68 (01/26 0338) Resp:  [16-20] 17 (01/26 0338) BP: (124-151)/(77-103) 151/103 (01/26 0338) SpO2:  [96 %-98 %] 98 % (01/26 0338) Physical Exam: General: Well appearing, conversant, NAD, white male Cardiovascular: RRR, NRMG Respiratory: CTABL Abdomen: Soft, NTTP, Non-distended Extremities: Moving all extremities independently, TTP in R. Knee just below patella with full ROM, slight erythema to knee joint Neruo: Strength 5/5 in Bilat LE and RUE, 3/5 in LUE, facial droop, significant dysarthria, Sensation grossly intact    Laboratory: Recent Labs  Lab 08/13/21 1830 08/13/21 1839 08/14/21 0435 08/17/21 0423  WBC 15.3*  --  13.6* 8.0  HGB 16.6 17.7* 16.2 13.7  HCT 49.7 52.0 46.6 40.7  PLT 330  --  283 241   Recent Labs  Lab 08/13/21 1830 08/13/21 1839 08/16/21 0352 08/16/21 1116 08/16/21 1905  NA 132*   < > 127* 130* 132*  K 3.9   < > 3.7 4.3 4.2  CL 94*   < > 97* 99 102  CO2 26   < > 24 22 22   BUN 37*   < > 17 17 18   CREATININE 1.28*   < > 0.93 0.98 1.04  CALCIUM 9.2   < > 8.4* 8.6* 8.5*  PROT 8.0  --   --   --   --   BILITOT 1.9*  --   --   --   --  ALKPHOS 55  --   --   --   --   ALT 74*  --   --   --   --   AST 64*  --   --   --   --   GLUCOSE 102*   < > 101* 105* 120*   < > = values in this interval not displayed.      Imaging/Diagnostic Tests:   Bess Kinds, MD 08/17/2021, 5:42 AM PGY-1, [facility] Family Medicine FPTS Intern pager: [phone], text pages welcome

## 2021-08-17 NOTE — Progress Notes (Signed)
FPTS Brief Progress Note  S: Resting comfortably on evaluation.  Did not disturb.   O: BP (!) 159/93 (BP Location: Left Arm)    Pulse 74    Temp 98.8 F (37.1 C) (Oral)    Resp 18    Ht 5\' 8"  (1.727 m)    Wt 79.9 kg    SpO2 96%    BMI 26.78 kg/m   General: Resting comfortably in bed Respiratory: Normal work of breathing Cardiac: Regular rate  A/P: Stroke Stable from prior evaluations.  We will continue day team's management.  Continue PT/OT/speech therapy. - Continue neurochecks every 4 hours - Continue Crestor 20 mg daily  Hyponatremia Sodium improved to 133.  No longer on maintenance IV fluids - Recheck BMP tomorrow  - Orders reviewed. Labs for AM ordered, which was adjusted as needed.    Gifford Shave, MD 08/17/2021, 8:03 PM PGY-3, [facility] Night Resident  Please page [phone] with questions.

## 2021-08-18 LAB — BASIC METABOLIC PANEL
Anion gap: 8 (ref 5–15)
BUN: 12 mg/dL (ref 8–23)
CO2: 23 mmol/L (ref 22–32)
Calcium: 9 mg/dL (ref 8.9–10.3)
Chloride: 100 mmol/L (ref 98–111)
Creatinine, Ser: 0.92 mg/dL (ref 0.61–1.24)
GFR, Estimated: >60 mL/min (ref >60)
Glucose, Bld: 103 mg/dL — ABNORMAL HIGH (ref 70–99)
Potassium: 4.2 mmol/L (ref 3.5–5.1)
Sodium: 131 mmol/L — ABNORMAL LOW (ref 135–145)

## 2021-08-18 MED ORDER — RAMELTEON 8 MG PO TABS
8.0000 mg | ORAL_TABLET | Freq: Every day | ORAL | Status: DC
  Administered 2021-08-18 – 2021-08-31 (×14): 8 mg via ORAL
  Filled 2021-08-18 (×16): qty 1

## 2021-08-18 NOTE — Progress Notes (Signed)
Occupational Therapy Treatment Patient Details Name: Jerry Humphrey MRN: [id number] DOB: 05-08-1961 Today's Date: 08/18/2021   History of present illness Pt is a 61 y/o male presenting on 1/22 with L sided weakness, facial droop and dysarthria.  CT head with R subcoritcal hemorrhage and midline shift. PMH includes: ETOH abuse, HTN, seizures.   OT comments  Pt continues to make slow improvements. Pt is most limited by impulsivity and a heavy L lean.  Pt's d/c plan udated to SNF due to pt not having 24/7 assist available at d/c.  Pt is not longer appropriate for inpatient rehab due to this lack o assist.  Will continue to focus on balance in standing and cognition and how it impacts adls.    Recommendations for follow up therapy are one component of a multi-disciplinary discharge planning process, led by the attending physician.  Recommendations may be updated based on patient status, additional functional criteria and insurance authorization.    Follow Up Recommendations  Skilled nursing-short term rehab (<3 hours/day)    Assistance Recommended at Discharge Frequent or constant Supervision/Assistance  Patient can return home with the following  A lot of help with walking and/or transfers;A lot of help with bathing/dressing/bathroom;Assistance with cooking/housework;Assistance with feeding;Direct supervision/assist for medications management;Direct supervision/assist for financial management;Assist for transportation;Help with stairs or ramp for entrance   Equipment Recommendations  Other (comment) (tbd)    Recommendations for Other Services      Precautions / Restrictions Precautions Precautions: Fall Restrictions Weight Bearing Restrictions: No       Mobility Bed Mobility Overal bed mobility: Needs Assistance Bed Mobility: Supine to Sit     Supine to sit: Min assist, HOB elevated     General bed mobility comments: patient needed simple cues and uesed the bed rail to sit  up    Transfers Overall transfer level: Needs assistance Equipment used: Rolling walker (2 wheels) Transfers: Sit to/from Stand Sit to Stand: Mod assist           General transfer comment: Continued work with sit to stand x4 focusing on hand placement and powering up without a L lean.     Balance Overall balance assessment: Needs assistance Sitting-balance support: Feet supported, Single extremity supported Sitting balance-Leahy Scale: Poor Sitting balance - Comments: patient had trouble controlling his trunk when seated at EOB Postural control: Left lateral lean, Posterior lean Standing balance support: Bilateral upper extremity supported, During functional activity Standing balance-Leahy Scale: Poor Standing balance comment: Pt requires cues to lean and bear weight through the R side.                           ADL either performed or assessed with clinical judgement   ADL Overall ADL's : Needs assistance/impaired                 Upper Body Dressing : Moderate assistance;Sitting   Lower Body Dressing: Maximal assistance;Sit to/from stand Lower Body Dressing Details (indicate cue type and reason): Pt attempted to donn socks at EOB. Pt very impulsive with movements and leans back losing balance x2.  Pt stood to pull pants up and lets go of walker with both Humphrey and is very unsteady with heavy left lean requiring total assist to recover.  Pt with very little insight and awareness of deficits. Toilet Transfer: Maximal assistance;BSC/3in1;Stand-pivot Statistician Details (indicate cue type and reason): Pt let L leg twist underneath him. Has difficulty during it during transfer due  to  neglect.         Functional mobility during ADLs: Maximal assistance;Total assistance;Rolling walker (2 wheels) General ADL Comments: Pt requiring max assist due to poor balance in standing. Pt with heavy L lean and has diffuculty correcting with VCs. Pt may benefit from work  with mirror in front of him to visually see his L lean.    Extremity/Trunk Assessment Upper Extremity Assessment Upper Extremity Assessment: LUE deficits/detail LUE Deficits / Details: grossly 3-/5 MMT with poor coordination, sensation and neglect vs inattention LUE Sensation: decreased light touch;decreased proprioception LUE Coordination: decreased fine motor;decreased gross motor   Lower Extremity Assessment Lower Extremity Assessment: Defer to PT evaluation        Vision       Perception     Praxis      Cognition Arousal/Alertness: Awake/alert Behavior During Therapy: Impulsive Overall Cognitive Status: Impaired/Different from baseline Area of Impairment: Attention, Memory, Safety/judgement, Awareness, Problem solving                   Current Attention Level: Sustained Memory: Decreased short-term memory Following Commands: Follows one step commands consistently, Follows one step commands with increased time, Follows multi-step commands inconsistently Safety/Judgement: Decreased awareness of deficits, Decreased awareness of safety Awareness: Emergent Problem Solving: Slow processing, Difficulty sequencing, Requires verbal cues, Requires tactile cues General Comments: Pt with significant L neglect of body and environment.        Exercises      Shoulder Instructions       General Comments Pt very impulsive and  unsafe at this time. Pt will need very close supervision and assist with all adls due to poor cognition.    Pertinent Vitals/ Pain       Pain Assessment Pain Assessment: No/denies pain  Home Living                                          Prior Functioning/Environment              Frequency  Min 2X/week        Progress Toward Goals  OT Goals(current goals can now be found in the care plan section)  Progress towards OT goals: Progressing toward goals  Acute Rehab OT Goals Patient Stated Goal: to walk again OT  Goal Formulation: With patient Time For Goal Achievement: 08/28/21 Potential to Achieve Goals: Fair ADL Goals Pt Will Perform Grooming: with supervision;sitting Pt Will Perform Upper Body Bathing: with supervision;sitting;with set-up Pt Will Perform Lower Body Dressing: with min assist;sitting/lateral leans;sit to/from stand Pt Will Transfer to Toilet: with min assist;ambulating;bedside commode Pt/caregiver will Perform Home Exercise Program: Increased strength;Left upper extremity;With written HEP provided;Increased ROM Additional ADL Goal #1: Pt will locate 3 items on L side of table without cueing, using compensatory techniques as needed.  Plan Discharge plan needs to be updated;Frequency needs to be updated    Co-evaluation                 AM-PAC OT "6 Clicks" Daily Activity     Outcome Measure   Help from another person eating meals?: A Little Help from another person taking care of personal grooming?: A Lot Help from another person toileting, which includes using toliet, bedpan, or urinal?: A Lot Help from another person bathing (including washing, rinsing, drying)?: A Lot Help from another person to put on and taking off  regular upper body clothing?: A Lot Help from another person to put on and taking off regular lower body clothing?: A Lot 6 Click Score: 13    End of Session Equipment Utilized During Treatment: Gait belt;Rolling walker (2 wheels)  OT Visit Diagnosis: Other abnormalities of gait and mobility (R26.89);Muscle weakness (generalized) (M62.81);Hemiplegia and hemiparesis;Other symptoms and signs involving cognitive function Hemiplegia - Right/Left: Left Hemiplegia - dominant/non-dominant: Non-Dominant Hemiplegia - caused by: Nontraumatic intracerebral hemorrhage   Activity Tolerance Patient tolerated treatment well   Patient Left in chair;with call bell/phone within reach;with chair alarm set   Nurse Communication Mobility status        Time:  9024-0973 OT Time Calculation (min): 18 min  Charges: OT General Charges $OT Visit: 1 Visit OT Treatments $Self Care/Home Management : 8-22 mins  Hope Budds 08/18/2021, 12:13 PM

## 2021-08-18 NOTE — Progress Notes (Signed)
Family Medicine Teaching Service Daily Progress Note Intern Pager: [phone]  Patient name: Jerry Humphrey Medical record number: [id number] Date of birth: March 05, 1961 Age: 61 y.o. Gender: male  Primary Care Provider: Pcp, No Consultants: Neurology Code Status: Full  Pt Overview and Major Events to Date:  08/13/21 - Patient admitted  Assessment and Plan:  SPIROS GREENFELD is a 61 y.o. male presenting with left side weakness, found to have hemorraghic stroke. PMH is significant for seizure due to alcohol withdrawal, HTN, Alcohol use and Tobacco use    Left side weakness   Hemorrhagic stroke Weakness and dysarthria remain stable, patient continues to work with PT. Patient awaiting SNF, disability and medicaid applications began.  Hyponatremia Na 131 today, down from 133 yesterday. -Continue to monitor -AM BMP  HTN BP 150-130's/80-110's, stable -Continue to monitor  R. Knee pain Stable, patient leg continues to hurt. Imaging showed no fracture or dislocation no significant effusion. There is soft tissue swelling along the anterior aspect of the knee, possibly suggesting contusion or cellulitis.  Hx of Alcohol use  Hx of alcohol withdrawal with seizure -Stable   Hx of Tobacco use -Stable  FEN/GI: Dyshpagia 3 PPx: SCDs Dispo:SNF  Pending insurance .  Subjective:  Patient doing well today, says he wakes up confused but was alert and oriented x 3  Objective: Temp:  [97.7 F (36.5 C)-98.8 F (37.1 C)] 98.8 F (37.1 C) (01/27 0729) Pulse Rate:  [55-77] 70 (01/27 0729) Resp:  [14-20] 14 (01/27 0729) BP: (133-159)/(89-112) 133/94 (01/27 0729) SpO2:  [96 %-100 %] 100 % (01/27 0729) Physical Exam: General: Well appearing, conversant, NAD, white male Cardiovascular: RRR, NRMG Respiratory: CTABL Abdomen: Soft, NTTP, non-distended Extremities: Moving all extremities independently, TTP in R. Knee just below patella with full ROM,  Neruo: Strength 5/5 in Bilat LE and RUE, 3/5  in LUE, facial droop, significant dysarthria, Sensation grossly intact, but decreased in LUE  Laboratory: Recent Labs  Lab 08/13/21 1830 08/13/21 1839 08/14/21 0435 08/17/21 0423  WBC 15.3*  --  13.6* 8.0  HGB 16.6 17.7* 16.2 13.7  HCT 49.7 52.0 46.6 40.7  PLT 330  --  283 241   Recent Labs  Lab 08/13/21 1830 08/13/21 1839 08/17/21 0423 08/17/21 1522 08/18/21 0223  NA 132*   < > 133* 133* 131*  K 3.9   < > 4.3 4.3 4.2  CL 94*   < > 104 103 100  CO2 26   < > 22 23 23   BUN 37*   < > 16 13 12   CREATININE 1.28*   < > 0.93 0.99 0.92  CALCIUM 9.2   < > 8.4* 8.8* 9.0  PROT 8.0  --   --   --   --   BILITOT 1.9*  --   --   --   --   ALKPHOS 55  --   --   --   --   ALT 74*  --   --   --   --   AST 64*  --   --   --   --   GLUCOSE 102*   < > 99 119* 103*   < > = values in this interval not displayed.      Imaging/Diagnostic Tests: Knee X-ray: No recent fracture or dislocation is seen. There is no significant effusion in the right knee joint. There is soft tissue swelling along the anterior aspect of the knee, possibly suggesting contusion or cellulitis.  , MD  08/18/2021, 8:27 AM PGY-1, [facility] FPTS Intern pager: [phone], text pages welcome

## 2021-08-18 NOTE — Progress Notes (Signed)
FPTS Brief Note Reviewed patient's vitals, recent notes.  Vitals:   08/18/21 1515 08/18/21 1919  BP: 122/81 109/74  Pulse:  75  Resp:  18  Temp:  97.7 F (36.5 C)  SpO2:  98%   At this time, no change in plan from day progress note.  Derrel Nip, MD Page [phone] with questions about this patient.

## 2021-08-18 NOTE — Progress Notes (Addendum)
Family Medicine Teaching Service Daily Progress Note Intern Pager: [phone]  Patient name: Jerry Humphrey Medical record number: [id number] Date of birth: February 11, 1961 Age: 61 y.o. Gender: male  Primary Care Provider: Pcp, No Consultants: Neurology Code Status: Full  Pt Overview and Major Events to Date:  08/13/21 - Patient admitted  Assessment and Plan:  Jerry Humphrey is a 61 y.o. male presenting with left side weakness, found to have hemorraghic stroke. PMH is significant for seizure due to alcohol withdrawal, HTN, Alcohol use and Tobacco use    Left side weakness   Hemorrhagic stroke Patient continues to have dysarthria as well as weakness which are stable from initial evaluation.  Patient is working with physical therapy.  Awaiting SNF placement.  Disability and Medicaid applications have been initiated.  Hyponatremia Sodium today yesterday of 131.  BMP today is pending. -Continue daily BMPs  HTN Blood pressures over the last 24 hours have ranged from 109/74-135/81.  Currently on amlodipine 10 mg daily. - Continue to monitor  R. Knee pain Patient continues to complain of right knee pain.  He had imaging which showed no fracture or dislocation or significant effusion.  Physical exam today shows no warmth or erythema of the right knee which could be signs of cellulitis - Continue as needed Tylenol for pain - Continue to work for SNF placement  Hx of Alcohol use  Hx of alcohol withdrawal with seizure CIWA's ranging from 0-2 - Continue to monitor   Hx of Tobacco use -Stable  FEN/GI: Dyshpagia 3 PPx: SCDs Dispo:SNF  Pending insurance .  Subjective:  Patient is sleeping when I enter the room.  Awakes easily.  I ask him how he is doing and he reports "I am trying to catch up on sleep".  No acute complaints this morning.  Objective: Temp:  [97.7 F (36.5 C)-99 F (37.2 C)] 97.7 F (36.5 C) (01/27 1919) Pulse Rate:  [55-75] 75 (01/27 1919) Resp:  [14-18] 18 (01/27  1919) BP: (109-137)/(74-94) 109/74 (01/27 1919) SpO2:  [96 %-100 %] 98 % (01/27 1919) Physical Exam: General: Sleeping, easily arousable. Cardiovascular: Regular rate and rhythm, no murmurs appreciated Respiratory: Clear to auscultation bilaterally with normal work of breathing Abdomen: Soft, nontender, positive bowel sounds Extremities: Patient is moving all extremities, laying on his right side.  We will not participate in evaluation because he "is trying to sleep".  Does not complain of any pain this morning. Neruo: Continues to have facial droop and dysarthria.  Sensation grossly intact.  Not participating neuro check this morning because he is "trying to sleep"  Laboratory: Recent Labs  Lab 08/13/21 1830 08/13/21 1839 08/14/21 0435 08/17/21 0423  WBC 15.3*  --  13.6* 8.0  HGB 16.6 17.7* 16.2 13.7  HCT 49.7 52.0 46.6 40.7  PLT 330  --  283 241    Recent Labs  Lab 08/13/21 1830 08/13/21 1839 08/17/21 0423 08/17/21 1522 08/18/21 0223  NA 132*   < > 133* 133* 131*  K 3.9   < > 4.3 4.3 4.2  CL 94*   < > 104 103 100  CO2 26   < > 22 23 23   BUN 37*   < > 16 13 12   CREATININE 1.28*   < > 0.93 0.99 0.92  CALCIUM 9.2   < > 8.4* 8.8* 9.0  PROT 8.0  --   --   --   --   BILITOT 1.9*  --   --   --   --  ALKPHOS 55  --   --   --   --   ALT 74*  --   --   --   --   AST 64*  --   --   --   --   GLUCOSE 102*   < > 99 119* 103*   < > = values in this interval not displayed.       Imaging/Diagnostic Tests: Knee X-ray: No recent fracture or dislocation is seen. There is no significant effusion in the right knee joint. There is soft tissue swelling along the anterior aspect of the knee, possibly suggesting contusion or cellulitis.  Derrel Nip, MD 08/18/2021, 11:34 PM PGY-3, [place] [facility] FPTS Intern pager: [phone], text pages welcome

## 2021-08-18 NOTE — Plan of Care (Signed)
°  Problem: Coping: °Goal: Will verbalize positive feelings about self °Outcome: Progressing °Goal: Will identify appropriate support needs °Outcome: Progressing °  °Problem: Ischemic Stroke/TIA Tissue Perfusion: °Goal: Complications of ischemic stroke/TIA will be minimized °Outcome: Progressing °  °

## 2021-08-18 NOTE — Progress Notes (Addendum)
Physical Therapy Treatment Patient Details Name: Jerry Humphrey MRN: [id number] DOB: Sep 09, 1960 Today's Date: 08/18/2021   History of Present Illness Pt is a 61 y/o male presenting on 1/22 with L sided weakness, facial droop and dysarthria.  CT head with R subcoritcal hemorrhage and midline shift. PMH includes: ETOH abuse, HTN, seizures.    PT Comments    Pt had improved ability to mobilize in his bed. Today's treatment focus was to improve proprioception and have the patient orient to a more upright posture. Patient was able to consistently orient to midline when looking at himself in the mirror seated at EOB but needed constant cuing to keep looking in the mirror. When in front of the mirror in standing patient had more difficulty maintaining upright posture, being able to find midline inconsistently, and was more distracted. With increased fatigue patient demonstrated decreased ability to focus. Based of BP taken in supine at the start of the session (135/81) and again in sitting after exercise (122/81), fatigue may be a result of pt's more tightly controlled BP. During treatment session patient showed deficits in strength, endurance, activity tolerance. Recommending therapy services at skilled nursing facility to address the previously stated deficits. Will continue to follow acutely to maximize functional mobility, independence and safety.     Recommendations for follow up therapy are one component of a multi-disciplinary discharge planning process, led by the attending physician.  Recommendations may be updated based on patient status, additional functional criteria and insurance authorization.  Follow Up Recommendations  Skilled nursing-short term rehab (<3 hours/day)     Assistance Recommended at Discharge Frequent or constant Supervision/Assistance  Patient can return home with the following Two people to help with walking and/or transfers;Two people to help with  bathing/dressing/bathroom;Assistance with feeding;Assist for transportation;Help with stairs or ramp for entrance;Assistance with cooking/housework   Equipment Recommendations       Recommendations for Other Services       Precautions / Restrictions Precautions Precautions: Fall Precaution Comments: Monitor BP Restrictions Weight Bearing Restrictions: No     Mobility  Bed Mobility Overal bed mobility: Needs Assistance Bed Mobility: Supine to Sit     Supine to sit: Min assist, HOB elevated Sit to supine: Mod assist        Transfers Overall transfer level: Needs assistance Equipment used: Rolling walker (2 wheels) Transfers: Sit to/from Stand Sit to Stand: Min assist           General transfer comment: Patient had some improvement with sit to stand transfer, require assistance for initiation and balance.    Ambulation/Gait                   Stairs             Wheelchair Mobility    Modified Rankin (Stroke Patients Only)       Balance Overall balance assessment: Needs assistance Sitting-balance support: Feet supported, Single extremity supported Sitting balance-Leahy Scale: Poor Sitting balance - Comments: patient had trouble controlling his trunk when seated at EOB, he was able to self correct when cued to look at himself in the mirror. Postural control: Left lateral lean, Posterior lean Standing balance support: Single extremity supported Standing balance-Leahy Scale: Poor Standing balance comment: Pt required constent cuing to bear weight through his left side and lean to his left side.                            Cognition  Arousal/Alertness: Awake/alert Behavior During Therapy: Impulsive Overall Cognitive Status: Impaired/Different from baseline Area of Impairment: Attention, Memory, Safety/judgement, Awareness, Problem solving, Following commands                   Current Attention Level: Selective   Following  Commands: Follows one step commands consistently, Follows one step commands with increased time, Follows multi-step commands inconsistently Safety/Judgement: Decreased awareness of deficits, Decreased awareness of safety Awareness: Emergent Problem Solving: Slow processing, Difficulty sequencing, Requires verbal cues, Requires tactile cues General Comments: Pt has L neglect        Exercises      General Comments General comments (skin integrity, edema, etc.): Pt is impulsive with exercises he kept trying to walk with standing balance exercise.      Pertinent Vitals/Pain      Home Living                          Prior Function            PT Goals (current goals can now be found in the care plan section)      Frequency    Min 4X/week      PT Plan Current plan remains appropriate    Co-evaluation              AM-PAC PT "6 Clicks" Mobility   Outcome Measure  Help needed turning from your back to your side while in a flat bed without using bedrails?: A Little Help needed moving from lying on your back to sitting on the side of a flat bed without using bedrails?: A Little Help needed moving to and from a bed to a chair (including a wheelchair)?: A Lot Help needed standing up from a chair using your arms (e.g., wheelchair or bedside chair)?: A Lot Help needed to walk in hospital room?: A Lot Help needed climbing 3-5 steps with a railing? : Total 6 Click Score: 13    End of Session Equipment Utilized During Treatment: Gait belt Activity Tolerance: Patient tolerated treatment well;Treatment limited secondary to medical complications (Comment) Patient left: in bed;with call bell/phone within reach;with bed alarm set   PT Visit Diagnosis: Unsteadiness on feet (R26.81);Muscle weakness (generalized) (M62.81);Other symptoms and signs involving the nervous system (R29.898)     Time: 7322-0254 PT Time Calculation (min) (ACUTE ONLY): 34 min  Charges:   $Therapeutic Activity: 23-37 mins                     Armanda Heritage, SPT    Armanda Heritage 08/18/2021, 5:17 PM

## 2021-08-18 NOTE — NC FL2 (Signed)
[place] MEDICAID FL2 LEVEL OF CARE SCREENING TOOL     IDENTIFICATION  Patient Name: Jerry Humphrey Birthdate: 01-07-1961 Sex: male Admission Date (Current Location): 08/13/2021  [facility] [place] Number:  Producer, television/film/video and Address:  [facility] [place], [address], [place], [place] [address]      Provider Number: [id number]  Attending Physician Name and Address:  Billey Co, MD  Relative Name and Phone Number:       Current Level of Care: Hospital Recommended Level of Care: Skilled Nursing Facility Prior Approval Number:    Date Approved/Denied:   PASRR Number: [id number]  Discharge Plan: SNF    Current Diagnoses: Patient Active Problem List   Diagnosis Date Noted   Knee pain    Right-sided nontraumatic intracerebral hemorrhage (HCC)    Hypertension    Alcohol use    Subcortical hemorrhage (HCC) 08/13/2021   Closed fracture of right distal tibia    Tibia/fibula fracture 02/01/2019   Alcohol abuse 01/30/2019   Chest pain 01/30/2019   Leukocytosis 01/30/2019   Tobacco abuse 01/30/2019   Closed fracture of right tibia and fibula 01/30/2019   Fall due to stumbling 01/30/2019   Seizure disorder (HCC) 01/30/2019    Orientation RESPIRATION BLADDER Height & Weight     Self, Time, Situation, Place  Normal Incontinent Weight: 176 lb 2.4 oz (79.9 kg) Height:  5\' 8"  (172.7 cm)  BEHAVIORAL SYMPTOMS/MOOD NEUROLOGICAL BOWEL NUTRITION STATUS      Continent Diet (see DC summary)  AMBULATORY STATUS COMMUNICATION OF NEEDS Skin   Extensive Assist Verbally Normal                       Personal Care Assistance Level of Assistance  Bathing, Feeding, Dressing Bathing Assistance: Maximum assistance Feeding assistance: Limited assistance Dressing Assistance: Maximum assistance     Functional Limitations Info  Speech     Speech Info: Impaired (dysarthria)    SPECIAL CARE FACTORS FREQUENCY  PT (By licensed PT), OT (By licensed  OT), Speech therapy     PT Frequency: 5x/wk OT Frequency: 5x/wk     Speech Therapy Frequency: 5x/wk      Contractures Contractures Info: Not present    Additional Factors Info  Code Status, Allergies Code Status Info: Full Allergies Info: Codeine           Current Medications (08/18/2021):  This is the current hospital active medication list Current Facility-Administered Medications  Medication Dose Route Frequency Provider Last Rate Last Admin   acetaminophen (TYLENOL) tablet 650 mg  650 mg Oral Q6H PRN 08/20/2021, MD   650 mg at 08/16/21 2345   Or   acetaminophen (TYLENOL) suppository 650 mg  650 mg Rectal Q6H PRN 2346, MD       amLODipine (NORVASC) tablet 10 mg  10 mg Oral Daily Jerre Simon, MD   10 mg at 08/18/21 1009   diclofenac Sodium (VOLTAREN) 1 % topical gel 2 g  2 g Topical BID PRN 08/20/21, MD       feeding supplement (BOOST / RESOURCE BREEZE) liquid 1 Container  1 Container Oral TID BM Levin Erp, MD   1 Container at 08/17/21 2015   folic acid (FOLVITE) tablet 1 mg  1 mg Oral Daily 08/19/21, MD   1 mg at 08/18/21 1009   melatonin tablet 3 mg  3 mg Oral QHS 08/20/21, MD   3 mg at 08/17/21 2139  multivitamin with minerals tablet 1 tablet  1 tablet Oral Daily Jerre Simon, MD   1 tablet at 08/18/21 1009   rosuvastatin (CRESTOR) tablet 20 mg  20 mg Oral Daily Levin Erp, MD   20 mg at 08/18/21 1009   thiamine tablet 100 mg  100 mg Oral Daily Jerre Simon, MD   100 mg at 08/18/21 1009   Or   thiamine (B-1) injection 100 mg  100 mg Intravenous Daily Jerre Simon, MD   100 mg at 08/14/21 1203     Discharge Medications: Please see discharge summary for a list of discharge medications.  Relevant Imaging Results:  Relevant Lab Results:   Additional Information SS#: [id number]  Baldemar Lenis, LCSW

## 2021-08-19 LAB — BASIC METABOLIC PANEL WITH GFR
Anion gap: 7 (ref 5–15)
BUN: 16 mg/dL (ref 8–23)
CO2: 25 mmol/L (ref 22–32)
Calcium: 9.3 mg/dL (ref 8.9–10.3)
Chloride: 102 mmol/L (ref 98–111)
Creatinine, Ser: 0.99 mg/dL (ref 0.61–1.24)
GFR, Estimated: >60 mL/min
Glucose, Bld: 115 mg/dL — ABNORMAL HIGH (ref 70–99)
Potassium: 4.3 mmol/L (ref 3.5–5.1)
Sodium: 134 mmol/L — ABNORMAL LOW (ref 135–145)

## 2021-08-19 MED ORDER — POLYETHYLENE GLYCOL 3350 17 G PO PACK
17.0000 g | PACK | Freq: Every day | ORAL | Status: DC
  Administered 2021-08-19 – 2021-09-01 (×7): 17 g via ORAL
  Filled 2021-08-19 (×13): qty 1

## 2021-08-19 MED ORDER — INFLUENZA VAC SPLIT QUAD 0.5 ML IM SUSY
0.5000 mL | PREFILLED_SYRINGE | INTRAMUSCULAR | Status: AC
  Administered 2021-08-20: 0.5 mL via INTRAMUSCULAR
  Filled 2021-08-19: qty 0.5

## 2021-08-19 NOTE — Progress Notes (Addendum)
FPTS Interim Progress Note  S: Paged by nursing staff that patient experienced witnessed fall.  Fell onto his buttocks.  Denies hitting his head.  He states that he feels weak but okay and he would just like to go to sleep.  O: BP 122/90 (BP Location: Right Arm)    Pulse 65    Temp 97.8 F (36.6 C) (Oral)    Resp 17    Ht 5\' 8"  (1.727 m)    Wt 79.9 kg    SpO2 98%    BMI 26.78 kg/m   General: No acute distress. Age appropriate. Respiratory: normal effort Neuro: alert and oriented Psych: normal affect  A/P: Witnessed fall as staffing was helping him back from bathroom.  No head injury.  Closely monitor and assist with ambulation.  , DO 08/19/2021, 11:31 AM PGY-3, [facility] Family Medicine Service pager [phone]

## 2021-08-19 NOTE — Progress Notes (Signed)
Pt had a witnessed fall while ambulating from the recliner chair to the bed. MD notified ,

## 2021-08-19 NOTE — Progress Notes (Signed)
FPTS Brief Note Reviewed patient's vitals, recent notes.  Vitals:   08/19/21 1610 08/19/21 2004  BP: 128/83 121/71  Pulse: 73 69  Resp: 17 18  Temp: 98.6 F (37 C) 97.9 F (36.6 C)  SpO2: 98% 98%   At this time, no change in plan from day progress note.  Evelena Leyden, DO Page [phone] with questions about this patient.

## 2021-08-20 NOTE — Progress Notes (Signed)
FPTS Brief Note Reviewed patient's vitals, recent notes.  Vitals:   08/20/21 1119 08/20/21 1505  BP: (!) 135/119 131/89  Pulse: 78 70  Resp: 16 20  Temp: 98.2 F (36.8 C) 98.8 F (37.1 C)  SpO2: 100% 99%   At this time, no change in plan from day progress note.  Gifford Shave, MD Page [phone] with questions about this patient.

## 2021-08-20 NOTE — Progress Notes (Addendum)
Family Medicine Teaching Service Daily Progress Note Intern Pager: [phone]  Patient name: Jerry Humphrey Medical record number: [id number] Date of birth: [birth year] Age: 61 y.o. Gender: male  Primary Care Provider: Pcp, No Consultants: Neurology Code Status: Full  Pt Overview and Major Events to Date:  08/13/21 - Admitted  Assessment and Plan: Jerry Humphrey is a 61 y.o. male presenting with left side weakness, found to have hemorraghic stroke. PMH is significant for seizure due to alcohol withdrawal, HTN, Alcohol use and Tobacco use.  Left-sided weakness   hemorrhagic stroke Patient remains stable, working with physical therapy.  Disability and Medicaid applications have been initiated.  Patient medically stable for discharge. - Awaiting SNF placement  Hyponatremia Na 134, which is stable. - Will discontinue daily BMP and check as indicated  HTN BP over the last 24 hours is overall normotensive. - Continue amlodipine 10 mg daily   FEN/GI: Dysphagia 30 PPx: SCDs Dispo:SNF pending insurance.  Subjective:  Patient has no complaints this morning and denies any chest pain, abdominal pain, or difficulty breathing. He has been working with physical therapy and reports the progress is average.  Objective: Temp:  [97.7 F (36.5 C)-98.9 F (37.2 C)] 97.7 F (36.5 C) (01/29 0409) Pulse Rate:  [62-73] 66 (01/29 0409) Resp:  [16-20] 18 (01/29 0409) BP: (113-145)/(71-90) 132/76 (01/29 0409) SpO2:  [98 %-100 %] 99 % (01/29 0409) Physical Exam: General: NAD, resting comfortably in bed Cardiovascular: RRR, 2+ peripheral pulses, no peripheral edema Respiratory: CTAB, breathing comfortably on room air Abdomen: soft, non-tender, non-distended  Laboratory: Recent Labs  Lab 08/13/21 1830 08/13/21 1839 08/14/21 0435 08/17/21 0423  WBC 15.3*  --  13.6* 8.0  HGB 16.6 17.7* 16.2 13.7  HCT 49.7 52.0 46.6 40.7  PLT 330  --  283 241   Recent Labs  Lab 08/13/21 1830  08/13/21 1839 08/17/21 1522 08/18/21 0223 08/19/21 0641  NA 132*   < > 133* 131* 134*  K 3.9   < > 4.3 4.2 4.3  CL 94*   < > 103 100 102  CO2 26   < > 23 23 25   BUN 37*   < > 13 12 16   CREATININE 1.28*   < > 0.99 0.92 0.99  CALCIUM 9.2   < > 8.8* 9.0 9.3  PROT 8.0  --   --   --   --   BILITOT 1.9*  --   --   --   --   ALKPHOS 55  --   --   --   --   ALT 74*  --   --   --   --   AST 64*  --   --   --   --   GLUCOSE 102*   < > 119* 103* 115*   < > = values in this interval not displayed.    Imaging/Diagnostic Tests: No results found.   , DO 08/20/2021, 6:10 AM PGY-2, [facility] FPTS Intern pager: [phone], text pages welcome

## 2021-08-21 NOTE — Progress Notes (Signed)
Physical Therapy Treatment Patient Details Name: Jerry PickingJoseph E Brutus MRN: [id number] DOB: [birth year] Today's Date: 08/21/2021   History of Present Illness Pt is a 61 y/o male presenting on 1/22 with L sided weakness, facial droop and dysarthria.  CT head with R subcoritcal hemorrhage and midline shift. PMH includes: ETOH abuse, HTN, seizures.    PT Comments    Pt making gradual progress.  Session focused on balance, target tapping for ataxia, and side stepping at sink.  Pt able to stand pivot transfer to R with min A but needing mod-max A at times for standing balance/side steps.  Unable to progress to forward ambulation due to lack of +2 assist.  Pt remains very impulsive requiring max cues to take his time and focus on control over speed or ROM.   Pt would be good AIR candidate in regards to mobility but does not have support at d/c so was declined, will need SNF.    Recommendations for follow up therapy are one component of a multi-disciplinary discharge planning process, led by the attending physician.  Recommendations may be updated based on patient status, additional functional criteria and insurance authorization.  Follow Up Recommendations  Skilled nursing-short term rehab (<3 hours/day) (AIR denied)     Assistance Recommended at Discharge Frequent or constant Supervision/Assistance  Patient can return home with the following Two people to help with walking and/or transfers;Two people to help with bathing/dressing/bathroom;Help with stairs or ramp for entrance;Assistance with cooking/housework;Assist for transportation   Equipment Recommendations  Wheelchair cushion (measurements PT);Wheelchair (measurements PT) (further assessment post acute)    Recommendations for Other Services       Precautions / Restrictions Precautions Precautions: Fall     Mobility  Bed Mobility Overal bed mobility: Needs Assistance Bed Mobility: Supine to Sit     Supine to sit: Supervision      General bed mobility comments: Use of rail with min cues for L side    Transfers Overall transfer level: Needs assistance Equipment used:  (bed rail or counter) Transfers: Sit to/from Stand, Bed to chair/wheelchair/BSC Sit to Stand: Min assist Stand pivot transfers: Min assist         General transfer comment: Sit to stand x 6 during session with cues for safety and min A; pivoted to chair toward R side with light min A to steady and cues    Ambulation/Gait Ambulation/Gait assistance: Max assist Gait Distance (Feet): 8 Feet (8'x4 SIDESTEPS) Assistive device:  (Countertop) Gait Pattern/deviations: Step-to pattern, Decreased stride length, Decreased dorsiflexion - left Gait velocity: decreased   Pre-gait activities: Standing in front of mirror at sink with UE positioned on counter.  Worked on upright straight posture and progressed to weight shifting/marching in place.  Pt impulsive and taking very large/high marches requiring max repeated cues for small controlled marches.  Initially requiring mod-max A balance but with repeated max cues for controlled amplitude and speed progressed to min A. General Gait Details: Did not have +2 for forward gait so worked on pre-gait and side steps at sink holding onto counter.  Requiring MAX cues for safety, control, balance, and taking his time.  Required assist for L UE to position on counter, cues for sequencing, min-mod A for balance majority of time but did need max A for 2 heavy LOB when going to L side too quickly.  High dependence on R UE.  Noted tendency for decreased DF and dragging of L foot   Stairs  Wheelchair Mobility    Modified Rankin (Stroke Patients Only) Modified Rankin (Stroke Patients Only) Pre-Morbid Rankin Score: No symptoms Modified Rankin: Moderately severe disability     Balance Overall balance assessment: Needs assistance Sitting-balance support: Feet supported, Single extremity supported Sitting  balance-Leahy Scale: Fair Sitting balance - Comments: good   Standing balance support: Single extremity supported Standing balance-Leahy Scale: Poor Standing balance comment: Requiring min A with brief moments of min guard in static stand; mod-max with dynamic.  Stood in Location manager with work on straight upright posture (improed L lean)                            Cognition Arousal/Alertness: Awake/alert Behavior During Therapy: Impulsive Overall Cognitive Status: Impaired/Different from baseline Area of Impairment: Attention, Memory, Safety/judgement, Awareness, Problem solving, Following commands, Orientation                 Orientation Level: Disoriented to, Time Current Attention Level: Selective Memory: Decreased short-term memory Following Commands: Follows one step commands inconsistently Safety/Judgement: Decreased awareness of deficits, Decreased awareness of safety Awareness: Emergent Problem Solving: Difficulty sequencing, Requires verbal cues, Requires tactile cues General Comments: L neglect, very impulsive and unaware of deficits; max cues for control and taking time        Exercises Other Exercises Other Exercises: Target tapping L foot in seated position.  3 targets x 10x 2 each with rest break as ataxia increased.  Cued pt for control over speed.  Had him demonstrate with R leg so he could see difference/ataxia on L side.    General Comments General comments (skin integrity, edema, etc.): Tested strength and MMT on L: ankle 4/5, knee and hip 5/5.  Ataxic throughout L LE and with decreased sensation - pt reports tingling.  Educated on rubbing, patting, massaging L UE and L LE to promote improved recovery of sensation.  Also, educated on target tapping HEP for ataxia and pt performing in chair      Pertinent Vitals/Pain Pain Assessment Pain Assessment: No/denies pain    Home Living                          Prior Function             PT Goals (current goals can now be found in the care plan section) Progress towards PT goals: Progressing toward goals    Frequency    Min 4X/week      PT Plan Current plan remains appropriate    Co-evaluation              AM-PAC PT "6 Clicks" Mobility   Outcome Measure  Help needed turning from your back to your side while in a flat bed without using bedrails?: A Little Help needed moving from lying on your back to sitting on the side of a flat bed without using bedrails?: A Little Help needed moving to and from a bed to a chair (including a wheelchair)?: A Lot Help needed standing up from a chair using your arms (e.g., wheelchair or bedside chair)?: A Lot Help needed to walk in hospital room?: Total Help needed climbing 3-5 steps with a railing? : Total 6 Click Score: 12    End of Session Equipment Utilized During Treatment: Gait belt Activity Tolerance: Patient tolerated treatment well Patient left: with chair alarm set;in chair;with call bell/phone within reach Nurse Communication: Mobility status PT Visit Diagnosis:  Unsteadiness on feet (R26.81);Muscle weakness (generalized) (M62.81);Other symptoms and signs involving the nervous system (R29.898)     Time: 3568-6168 PT Time Calculation (min) (ACUTE ONLY): 27 min  Charges:  $Neuromuscular Re-education: 23-37 mins                     Anise Salvo, PT Acute Rehab Services Pager [phone] [facility] [phone]    Rayetta Humphrey 08/21/2021, 2:23 PM

## 2021-08-21 NOTE — Progress Notes (Signed)
Family Medicine Teaching Service Daily Progress Note Intern Pager: [phone]  Patient name: Jerry Humphrey Medical record number: [id number] Date of birth: [birth year] Age: 61 y.o. Gender: male  Primary Care Provider: Pcp, No Consultants: Neurology s/o Code Status: Full  Pt Overview and Major Events to Date:  08/13/21 - Admitted  Assessment and Plan:  Jerry Humphrey is a 61 y.o. male presenting with left side weakness, found to have hemorraghic stroke. PMH is significant for seizure due to alcohol withdrawal, HTN, Alcohol use and Tobacco use.   LUE weakness   Dysarthria   hemorrhagic stroke Patient remains stable, continuing to work with PT/OT. Awaiting medicaid and disability applications. Patient is medically stable for discharge to SNF. -PT/OT  Hyponatremia Na stable on 08/19/21. Will recheck q 36 hour  HTN BP range 130-140's/80-100's, stable with some diastolic htn. Will continue to monitor -Continue amlodipine 10 mg daily -Continue to monitor   FEN/GI: Dysphagia 30 PPx: SCDs Dispo:SNF  Pending insurance .    Subjective:  In good spirits today, no complaints this morning  Objective: Temp:  [97.4 F (36.3 C)-98.8 F (37.1 C)] 97.8 F (36.6 C) (01/30 0400) Pulse Rate:  [66-78] 76 (01/30 0400) Resp:  [14-20] 20 (01/30 0400) BP: (130-143)/(86-119) 130/86 (01/30 0400) SpO2:  [98 %-100 %] 99 % (01/30 0400) Physical Exam: General: Well appearing, good mood/affect, NAD, white male Cardiovascular: RRR, NRMG Respiratory: CATBL Abdomen: Soft, NTTP, non-distended Extremities: Moving all extremities independently, no edema, Neruo: Strength 5/5 in Bilat LE and RUE, 3/5 in LUE, facial droop, significant dysarthria, Sensation grossly intact, but decreased in LUE  Laboratory: Recent Labs  Lab 08/17/21 0423  WBC 8.0  HGB 13.7  HCT 40.7  PLT 241   Recent Labs  Lab 08/17/21 1522 08/18/21 0223 08/19/21 0641  NA 133* 131* 134*  K 4.3 4.2 4.3  CL 103 100 102  CO2 23  23 25   BUN 13 12 16   CREATININE 0.99 0.92 0.99  CALCIUM 8.8* 9.0 9.3  GLUCOSE 119* 103* 115*      Imaging/Diagnostic Tests:   , MD 08/21/2021, 7:37 AM PGY-1, [facility] FPTS Intern pager: [phone], text pages welcome

## 2021-08-21 NOTE — Plan of Care (Signed)
°  Problem: Nutrition: Goal: Risk of aspiration will decrease Outcome: Progressing   Problem: Coping: Goal: Will verbalize positive feelings about self Outcome: Progressing Goal: Will identify appropriate support needs Outcome: Progressing   Problem: Spontaneous Subarachnoid Hemorrhage Tissue Perfusion: Goal: Complications of Spontaneous Subarachnoid Hemorrhage will be minimized Outcome: Progressing

## 2021-08-21 NOTE — Progress Notes (Signed)
FPTS Brief Note Reviewed patient's vitals, recent notes.  Vitals:   08/21/21 1455 08/21/21 1925  BP: 120/83 (!) 127/91  Pulse: 74 81  Resp: 16 16  Temp: 98.4 F (36.9 C) 98.4 F (36.9 C)  SpO2: 100% 98%   At this time, no change in plan from day progress note.  Gerrit Heck, MD Page [phone] with questions about this patient.

## 2021-08-21 NOTE — Progress Notes (Signed)
Speech Language Pathology Treatment: Dysphagia;Cognitive-Linquistic  Patient Details Name: Jerry Humphrey MRN: [id number] DOB: August 10, 1960 Today's Date: 08/21/2021 Time: [id number] SLP Time Calculation (min) (ACUTE ONLY): 13 min  Assessment / Plan / Recommendation Clinical Impression  Pt was seen for dysphagia and dysarthria treatment. He was alert and cooperative during the session. Pt reported that he is tolerating Dysphagia 3 diet and thin liquids well; Upon entry in pt.'s room he had consumed ~80% of lunch tray independently. Pt observed with PO trials of thin liquid (milk) and solid graham cracker. Pt tolerated all textures without overt s/sx of aspiration. Mastication was [facility] and oral clearance adequate. SLP educated pt on lingual sweep in left side of cheek in order to clear any residual buccal pocketing. Pt.'s diet will be advanced to regular textures with thin liquids. SLP will continue to follow pt's tolerance of regular diet. In the area of speech & language, pt received intervention for dysarthria and cognitive communicative deficits. SLP used open-ended problem-solving questions and SLOP technique (for dysarthria) to benefit pt. Pt successfully answered 5/5 functional problem-solving questions with 100% accuracy. Pt verbalized understanding of asking unfamiliar listeners to "be patient" with him and to speak louder and over-articulate structures necessary for speech-sound production. Pt continues to exhibit mild-moderate dysarthria, and requires verbal cues to slow down and speak loud. SLP to continue to follow pending pt d/c to SNF.    HPI HPI: 61 yo with PMHx of ETOH and Tobacco abuse, HTN, Alcohol withdrawal Seizures presented with left-sided weakness. He endorses a slight Headache this morning with a reduced sensation in his left extremities. MRI Biconvex intra-axial hemorrhage in the right basal ganglia with estimated blood volume of 29 mL. Surrounding edema and regional mass effect  including mild leftward midline shift of 4 mm.      SLP Plan  Continue with current plan of care      Recommendations for follow up therapy are one component of a multi-disciplinary discharge planning process, led by the attending physician.  Recommendations may be updated based on patient status, additional functional criteria and insurance authorization.    Recommendations  Liquids provided via: Cup;Straw Medication Administration: Whole meds with puree Supervision: Patient able to self feed;Staff to assist with self feeding;Full supervision/cueing for compensatory strategies Compensations: Slow rate;Small sips/bites;Lingual sweep for clearance of pocketing Postural Changes and/or Swallow Maneuvers: Seated upright 90 degrees                Oral Care Recommendations: Oral care BID Follow Up Recommendations: Skilled nursing-short term rehab (<3 hours/day) Assistance recommended at discharge: Set up Supervision/Assistance SLP Visit Diagnosis: Dysarthria and anarthria (R47.1);Cognitive communication deficit (R41.841);Dysphagia, unspecified (R13.10) Plan: Continue with current plan of care           [facility]  08/21/2021, 3:02 PM

## 2021-08-22 LAB — BASIC METABOLIC PANEL
Anion gap: 9 (ref 5–15)
BUN: 18 mg/dL (ref 8–23)
CO2: 28 mmol/L (ref 22–32)
Calcium: 9.3 mg/dL (ref 8.9–10.3)
Chloride: 97 mmol/L — ABNORMAL LOW (ref 98–111)
Creatinine, Ser: 1.08 mg/dL (ref 0.61–1.24)
GFR, Estimated: >60 mL/min (ref >60)
Glucose, Bld: 115 mg/dL — ABNORMAL HIGH (ref 70–99)
Potassium: 5.1 mmol/L (ref 3.5–5.1)
Sodium: 134 mmol/L — ABNORMAL LOW (ref 135–145)

## 2021-08-22 MED ORDER — MELATONIN 5 MG PO TABS
5.0000 mg | ORAL_TABLET | Freq: Every day | ORAL | Status: DC
  Administered 2021-08-22 – 2021-08-30 (×9): 5 mg via ORAL
  Filled 2021-08-22 (×9): qty 1

## 2021-08-22 NOTE — Progress Notes (Signed)
Physical Therapy Treatment Patient Details Name: Jerry Humphrey MRN: [id number] DOB: November 11, 1960 Today's Date: 08/22/2021   History of Present Illness Pt is a 61 y/o male presenting on 1/22 with L sided weakness, facial droop and dysarthria.  CT head with R subcortical hemorrhage and midline shift. PMH includes: ETOH abuse, HTN, seizures.    PT Comments    Upon arrival to room, pt soiled in drink on L side with no awareness. Pt with continued significant L inattention and L lateral leaning, especially with fatigue. Pt ambulatory in hallway, requiring mod +2 for righting balance and maintaining straight hallway trajectory. Pt tolerated repeated sit<>stands throughout session, and by end of session pt engaging LUE in transfers. PT to continue to recommend SNF level of care post-acutely.   Pt with period of dizziness after standing at sink x2 mins, BP 140s/90s and dizziness subsided with rest.     Recommendations for follow up therapy are one component of a multi-disciplinary discharge planning process, led by the attending physician.  Recommendations may be updated based on patient status, additional functional criteria and insurance authorization.  Follow Up Recommendations  Skilled nursing-short term rehab (<3 hours/day) (AIR denied)     Assistance Recommended at Discharge Frequent or constant Supervision/Assistance  Patient can return home with the following Two people to help with walking and/or transfers;Two people to help with bathing/dressing/bathroom;Help with stairs or ramp for entrance;Assistance with cooking/housework;Assist for transportation   Equipment Recommendations  Wheelchair cushion (measurements PT);Wheelchair (measurements PT) (further assessment post acute)    Recommendations for Other Services       Precautions / Restrictions Precautions Precautions: Fall Precaution Comments: impulsivity, L inattention Restrictions Weight Bearing Restrictions: No      Mobility  Bed Mobility Overal bed mobility: Needs Assistance Bed Mobility: Supine to Sit     Supine to sit: Min assist     General bed mobility comments: assist for steadying and correcting L lateral lean when scooting to EOB.    Transfers Overall transfer level: Needs assistance Equipment used: 1 person hand held assist Transfers: Sit to/from Stand Sit to Stand: Min assist           General transfer comment: assist to steady and right balance to upright in standing, slow eccentric lower, and cues for hand placement when rising/sitting.    Ambulation/Gait Ambulation/Gait assistance: Mod assist, +2 safety/equipment Gait Distance (Feet): 75 Feet (+60 after seated rest break) Assistive device: 2 person hand held assist Gait Pattern/deviations: Step-through pattern, Decreased stride length, Ataxic, Decreased weight shift to left, Staggering left Gait velocity: decr     General Gait Details: mod assist to steady, correct heavy L lateral leaning throughout session, correcting trajectory in hallway as pt veering L. +2 for safety, HHA as needed   Stairs             Wheelchair Mobility    Modified Rankin (Stroke Patients Only) Modified Rankin (Stroke Patients Only) Pre-Morbid Rankin Score: No symptoms Modified Rankin: Moderately severe disability     Balance Overall balance assessment: Needs assistance Sitting-balance support: Feet supported, Single extremity supported Sitting balance-Leahy Scale: Fair Sitting balance - Comments: able to maintain sitting balance, not dynamically Postural control: Left lateral lean Standing balance support: Single extremity supported Standing balance-Leahy Scale: Poor                              Cognition Arousal/Alertness: Awake/alert Behavior During Therapy: Impulsive Overall Cognitive Status: Impaired/Different from  baseline Area of Impairment: Attention, Safety/judgement, Awareness, Problem solving,  Following commands, Orientation                 Orientation Level: Disoriented to, Situation Current Attention Level: Selective   Following Commands: Follows one step commands with increased time Safety/Judgement: Decreased awareness of deficits, Decreased awareness of safety Awareness: Emergent Problem Solving: Difficulty sequencing, Requires verbal cues, Requires tactile cues General Comments: Pt lacks insight into deficits, requires mod cuing to attend to L side throughout session. Pt states he does not know why he is in the hospital, oriented to his CVA.        Exercises Other Exercises Other Exercises: Standing balance: standing at sink with L hand in WB, cues for upright posture as pt with tendency for L lean. Max cues for upright posture, benefits from use of mirror. Functional task of washing face and combing beard with RUE    General Comments        Pertinent Vitals/Pain Pain Assessment Pain Assessment: Faces Faces Pain Scale: Hurts little more Pain Location: R knee Pain Descriptors / Indicators: Sore, Grimacing Pain Intervention(s): Limited activity within patient's tolerance, Monitored during session, Repositioned    Home Living                          Prior Function            PT Goals (current goals can now be found in the care plan section) Acute Rehab PT Goals Patient Stated Goal: to go home PT Goal Formulation: With patient Time For Goal Achievement: 08/28/21 Potential to Achieve Goals: Good Progress towards PT goals: Progressing toward goals    Frequency    Min 4X/week      PT Plan Current plan remains appropriate    Co-evaluation              AM-PAC PT "6 Clicks" Mobility   Outcome Measure  Help needed turning from your back to your side while in a flat bed without using bedrails?: A Little Help needed moving from lying on your back to sitting on the side of a flat bed without using bedrails?: A Little Help needed  moving to and from a bed to a chair (including a wheelchair)?: A Lot Help needed standing up from a chair using your arms (e.g., wheelchair or bedside chair)?: A Lot Help needed to walk in hospital room?: A Lot Help needed climbing 3-5 steps with a railing? : Total 6 Click Score: 13    End of Session Equipment Utilized During Treatment: Gait belt Activity Tolerance: Patient tolerated treatment well Patient left: with chair alarm set;in chair;with call bell/phone within reach (posey alarm belt) Nurse Communication: Mobility status PT Visit Diagnosis: Unsteadiness on feet (R26.81);Muscle weakness (generalized) (M62.81);Other symptoms and signs involving the nervous system (R29.898)     Time: 1420-1450 PT Time Calculation (min) (ACUTE ONLY): 30 min  Charges:  $Therapeutic Activity: 8-22 mins $Neuromuscular Re-education: 8-22 mins                     Marye Round, PT DPT Acute Rehabilitation Services Pager [phone]  Office [phone]    Tyrone Apple E Christain [place] 08/22/2021, 3:15 PM

## 2021-08-22 NOTE — Progress Notes (Signed)
Occupational Therapy Treatment Patient Details Name: Jerry Humphrey MRN: [id number] DOB: [birth year] Today's Date: 08/22/2021   History of present illness Pt is a 61 y/o male presenting on 1/22 with L sided weakness, facial droop and dysarthria.  CT head with R subcoritcal hemorrhage and midline shift. PMH includes: ETOH abuse, HTN, seizures.   OT comments  Patient received in bed and eager to participate with OT and get out of bed. Patient performed transfer to wheelchair with stand pivot transfer and stood at sink for grooming and seated for bathing except standing for peri area cleaning.  Patient was able to use LUE as gross and functional assist with bathing and applying deodorant. Patient instructed in AROM exercises for LUE shoulder to increase functional ROM. Acute OT to continue to follow.    Recommendations for follow up therapy are one component of a multi-disciplinary discharge planning process, led by the attending physician.  Recommendations may be updated based on patient status, additional functional criteria and insurance authorization.    Follow Up Recommendations  Skilled nursing-short term rehab (<3 hours/day)    Assistance Recommended at Discharge Frequent or constant Supervision/Assistance  Patient can return home with the following  A lot of help with walking and/or transfers;A lot of help with bathing/dressing/bathroom;Assistance with cooking/housework;Assistance with feeding;Direct supervision/assist for medications management;Direct supervision/assist for financial management;Assist for transportation;Help with stairs or ramp for entrance   Equipment Recommendations  Other (comment) (TBD)    Recommendations for Other Services      Precautions / Restrictions Precautions Precautions: Fall Precaution Comments: Monitor BP       Mobility Bed Mobility Overal bed mobility: Needs Assistance Bed Mobility: Supine to Sit     Supine to sit: Min assist     General  bed mobility comments: min assist to raise trunk    Transfers Overall transfer level: Needs assistance Equipment used: None Transfers: Sit to/from Stand, Bed to chair/wheelchair/BSC Sit to Stand: Min assist Stand pivot transfers: Min assist         General transfer comment: stand pivot transfer to recliner from EOB     Balance Overall balance assessment: Needs assistance Sitting-balance support: Feet supported, Single extremity supported Sitting balance-Leahy Scale: Fair Sitting balance - Comments: able to maintain sitting balance Postural control: Left lateral lean, Posterior lean Standing balance support: Single extremity supported Standing balance-Leahy Scale: Poor Standing balance comment: attempted to perform self care standing at sink but demonstrated poor balance and safety                           ADL either performed or assessed with clinical judgement   ADL Overall ADL's : Needs assistance/impaired     Grooming: Wash/dry hands;Wash/dry face;Applying deodorant;Brushing hair;Moderate assistance;Sitting;Standing Grooming Details (indicate cue type and reason): stood at sink for washing face and hands with mod assist due to LUE.  Patient was able to comb hair sitting and required mod assist to apply deodarant Upper Body Bathing: Moderate assistance;Sitting Upper Body Bathing Details (indicate cue type and reason): patient attempted to use LUE to bathe RUE Lower Body Bathing: Moderate assistance Lower Body Bathing Details (indicate cue type and reason): stood for peri area cleaning with mod assist to complete Upper Body Dressing : Moderate assistance;Sitting Upper Body Dressing Details (indicate cue type and reason): changed gown Lower Body Dressing: Maximal assistance;Sit to/from stand Lower Body Dressing Details (indicate cue type and reason): Attempted to donn socks but was unable to perform with one  handed technique               General ADL  Comments: Poor balance in standing but was able to follow directions for self care in sitting    Extremity/Trunk Assessment Upper Extremity Assessment LUE Deficits / Details: grossly 3-/5 MMT with poor coordination, sensation and neglect vs inattention LUE Sensation: decreased light touch;decreased proprioception LUE Coordination: decreased fine motor;decreased gross motor            Vision   Eye Alignment: Within Functional Limits Ocular Range of Motion: Within Functional Limits Alignment/Gaze Preference: Within Defined Limits Tracking/Visual Pursuits: Impaired - to be further tested in functional context Visual Fields: Impaired-to be further tested in functional context Additional Comments: verbal cues to stand to left   Perception     Praxis      Cognition Arousal/Alertness: Awake/alert Behavior During Therapy: Impulsive Overall Cognitive Status: Impaired/Different from baseline Area of Impairment: Attention, Memory, Safety/judgement, Awareness, Problem solving, Following commands, Orientation                 Orientation Level: Disoriented to, Time Current Attention Level: Selective Memory: Decreased short-term memory Following Commands: Follows one step commands inconsistently Safety/Judgement: Decreased awareness of deficits, Decreased awareness of safety Awareness: Emergent Problem Solving: Difficulty sequencing, Requires verbal cues, Requires tactile cues General Comments: "all I need is a walker and I can walk out of here"  Patient stated to COTA. Patient corrected and informed that he needs assistance for his safety        Exercises Exercises: General Upper Extremity General Exercises - Upper Extremity Shoulder Flexion: AROM, 10 reps, Left, Seated Shoulder Extension: AROM, Left, 10 reps, Seated Shoulder ABduction: AROM, Left, 10 reps, Seated Shoulder ADduction: AROM, Left, 10 reps, Seated    Shoulder Instructions       General Comments       Pertinent Vitals/ Pain       Pain Assessment Pain Assessment: No/denies pain  Home Living                                          Prior Functioning/Environment              Frequency  Min 2X/week        Progress Toward Goals  OT Goals(current goals can now be found in the care plan section)  Progress towards OT goals: Progressing toward goals  Acute Rehab OT Goals Patient Stated Goal: go to rehab OT Goal Formulation: With patient Time For Goal Achievement: 08/28/21 Potential to Achieve Goals: Fair ADL Goals Pt Will Perform Grooming: with supervision;sitting Pt Will Perform Upper Body Bathing: with supervision;sitting;with set-up Pt Will Perform Lower Body Dressing: with min assist;sitting/lateral leans;sit to/from stand Pt Will Transfer to Toilet: with min assist;ambulating;bedside commode Pt/caregiver will Perform Home Exercise Program: Increased strength;Left upper extremity;With written HEP provided;Increased ROM Additional ADL Goal #1: Pt will locate 3 items on L side of table without cueing, using compensatory techniques as needed.  Plan Discharge plan needs to be updated;Frequency needs to be updated    Co-evaluation                 AM-PAC OT "6 Clicks" Daily Activity     Outcome Measure   Help from another person eating meals?: A Little Help from another person taking care of personal grooming?: A Lot Help from another person toileting, which includes  using toliet, bedpan, or urinal?: A Lot Help from another person bathing (including washing, rinsing, drying)?: A Lot Help from another person to put on and taking off regular upper body clothing?: A Lot Help from another person to put on and taking off regular lower body clothing?: A Lot 6 Click Score: 13    End of Session Equipment Utilized During Treatment: Gait belt  OT Visit Diagnosis: Other abnormalities of gait and mobility (R26.89);Muscle weakness (generalized)  (M62.81);Hemiplegia and hemiparesis;Other symptoms and signs involving cognitive function Hemiplegia - Right/Left: Left Hemiplegia - dominant/non-dominant: Non-Dominant Hemiplegia - caused by: Nontraumatic intracerebral hemorrhage   Activity Tolerance Patient tolerated treatment well   Patient Left in chair;with call bell/phone within reach;with chair alarm set   Nurse Communication Mobility status        Time: 8832-5498 OT Time Calculation (min): 28 min  Charges: OT General Charges $OT Visit: 1 Visit OT Treatments $Self Care/Home Management : 23-37 mins  Alfonse Flavors, OTA Acute Rehabilitation Services  Pager [phone] Office [phone]   Dewain Penning 08/22/2021, 11:14 AM

## 2021-08-22 NOTE — Progress Notes (Signed)
Family Medicine Teaching Service Daily Progress Note Intern Pager: [phone]  Patient name: Jerry Humphrey Medical record number: [id number] Date of birth: Dec 05, 1960 Age: 61 y.o. Gender: male  Primary Care Provider: Pcp, No Consultants: Neurology s/o Code Status: Full  Pt Overview and Major Events to Date:  08/13/21 - Admitted  Assessment and Plan:  Jerry Humphrey is a 61 y.o. male presenting with left side weakness, found to have hemorraghic stroke. PMH is significant for seizure due to alcohol withdrawal, HTN, Alcohol use and Tobacco use. Patient is medically stable for SNF   LUE weakness   Dysarthria   hemorrhagic stroke Patient continues to progress with PT/OT/SLP, SLP recommending regular diet with thin liquids. Patient awaiting insurance and disability application. Patient is medically stable for SNF  Hyponatremia Na 134, stbale from 1/28. Will check BMP weekly  HTN BP range from 120-130's/80-90's stable, will continue to monitor -Continue amlodipine 10 mg daily -Continue to monitor  FEN/GI: Regular diet PPx: SCDs Dispo:SNF  Pending insurance .   Subjective:  No complaints today, patient didn't sleep well and want's something for sleep. Patient continues to progress with PT, says he walked to sink and bathroom yesterday.  Objective: Temp:  [97.8 F (36.6 C)-98.6 F (37 C)] 98.6 F (37 C) (01/31 0516) Pulse Rate:  [63-81] 70 (01/31 0516) Resp:  [16-20] 16 (01/31 0516) BP: (120-146)/(83-95) 139/86 (01/31 0516) SpO2:  [98 %-100 %] 98 % (01/31 0516) Physical Exam: General: Well appearing, good mood/affect, NAD, white male Cardiovascular: RRR, [place] Respiratory: CTABL Abdomen: Soft, NTTP, non-distended Extremities: Moving all extremities independently, no edema Neruo: Strength 5/5 in Bilat LE and RUE, 3/5 in LUE w/ improved finger tapping on right hand, facial droop, significant dysarthria  Laboratory: Recent Labs  Lab 08/17/21 0423  WBC 8.0  HGB 13.7  HCT  40.7  PLT 241   Recent Labs  Lab 08/18/21 0223 08/19/21 0641 08/22/21 0153  NA 131* 134* 134*  K 4.2 4.3 5.1  CL 100 102 97*  CO2 23 25 28   BUN 12 16 18   CREATININE 0.92 0.99 1.08  CALCIUM 9.0 9.3 9.3  GLUCOSE 103* 115* 115*      Imaging/Diagnostic Tests:   Holley Bouche, MD 08/22/2021, 6:33 AM PGY-1, [place] Intern pager: [phone], text pages welcome

## 2021-08-23 NOTE — Progress Notes (Signed)
FPTS Brief Note Reviewed patient's vitals, recent notes.  Vitals:   08/22/21 1938 08/23/21 0008  BP: (!) 136/91 (!) 131/100  Pulse: 73 69  Resp: 16 15  Temp: 98.4 F (36.9 C) 98.7 F (37.1 C)  SpO2: 98% 99%   At this time, no change in plan from day progress note.  Gerrit Heck, MD Page [phone] with questions about this patient.

## 2021-08-23 NOTE — Progress Notes (Signed)
FPTS Brief Note Reviewed patient's vitals, recent notes.  Vitals:   08/23/21 1510 08/23/21 1928  BP: 129/86 140/84  Pulse: 73 75  Resp: 16 17  Temp: 98.4 F (36.9 C) 98.6 F (37 C)  SpO2: 97% 98%   At this time, no change in plan from day progress note.  Levin Erp, MD Page [phone] with questions about this patient.

## 2021-08-23 NOTE — Progress Notes (Signed)
Physical Therapy Treatment Patient Details Name: Jerry Humphrey MRN: [id number] DOB: 02/22/1961 Today's Date: 08/23/2021   History of Present Illness Pt is a 61 y/o male presenting on 1/22 with L sided weakness, facial droop and dysarthria.  CT head with R subcortical hemorrhage and midline shift. PMH includes: ETOH abuse, HTN, seizures.    PT Comments    Pt eager to get up. Impulsiveness is moderated well with cues.  Emphasis on transition, transfers, general safety and progression of gait with the RW and HHA over short distances working on balance, w/shifting and improving gait stability.    Recommendations for follow up therapy are one component of a multi-disciplinary discharge planning process, led by the attending physician.  Recommendations may be updated based on patient status, additional functional criteria and insurance authorization.  Follow Up Recommendations  Skilled nursing-short term rehab (<3 hours/day)     Assistance Recommended at Discharge Frequent or constant Supervision/Assistance  Patient can return home with the following Two people to help with walking and/or transfers;A lot of help with bathing/dressing/bathroom;Assistance with cooking/housework;Direct supervision/assist for medications management;Assist for transportation;Help with stairs or ramp for entrance   Equipment Recommendations  Wheelchair cushion (measurements PT);Wheelchair (measurements PT)    Recommendations for Other Services       Precautions / Restrictions Precautions Precautions: Fall Precaution Comments: impulsivity, L inattention     Mobility  Bed Mobility Overal bed mobility: Needs Assistance Bed Mobility: Supine to Sit     Supine to sit: Min assist     General bed mobility comments: stead\ying assist for uncontrolled balance and tendency to lean left initially in sitting.    Transfers Overall transfer level: Needs assistance Equipment used: Rolling walker (2 wheels), 1  person hand held assist Transfers: Sit to/from Stand Sit to Stand: Min assist           General transfer comment: steady assist, correction for L lean    Ambulation/Gait Ambulation/Gait assistance: Mod assist, +2 safety/equipment, +2 physical assistance Gait Distance (Feet): 40 Feet (x2 and 50 feet with rest to regroup from heavy L Lateral lean) Assistive device: Rolling walker (2 wheels), 1 person hand held assist Gait Pattern/deviations: Step-through pattern Gait velocity: decr Gait velocity interpretation: <1.31 ft/sec, indicative of household ambulator   General Gait Details: Pt with heavy lean L, mod assist to help w/shift R to unweight L LE.  L swing through with adducted moment with narrow,scissorred step and then a relatively quick step on the R.  degraded as pt fatigues.  multimodal cues for normalizing the above pattern   Stairs             Wheelchair Mobility    Modified Rankin (Stroke Patients Only) Modified Rankin (Stroke Patients Only) Pre-Morbid Rankin Score: No symptoms Modified Rankin: Moderately severe disability     Balance Overall balance assessment: Needs assistance   Sitting balance-Leahy Scale: Fair Sitting balance - Comments: able to maintain sitting balance, not dynamically   Standing balance support: Single extremity supported Standing balance-Leahy Scale: Poor Standing balance comment: heavy list to the left that can be moderate with cues and facilitation.                            Cognition Arousal/Alertness: Awake/alert Behavior During Therapy: WFL for tasks assessed/performed, Impulsive Overall Cognitive Status:  (NT formally)                   Orientation Level: Situation Current Attention  Level: Selective   Following Commands: Follows one step commands with increased time Safety/Judgement: Decreased awareness of deficits, Decreased awareness of safety Awareness: Emergent Problem Solving: Slow processing           Exercises      General Comments        Pertinent Vitals/Pain Pain Assessment Pain Assessment: No/denies pain Pain Intervention(s): Monitored during session    Home Living                          Prior Function            PT Goals (current goals can now be found in the care plan section) Acute Rehab PT Goals Patient Stated Goal: to go home PT Goal Formulation: With patient Time For Goal Achievement: 08/28/21 Potential to Achieve Goals: Good Progress towards PT goals: Progressing toward goals    Frequency    Min 4X/week      PT Plan Current plan remains appropriate    Co-evaluation              AM-PAC PT "6 Clicks" Mobility   Outcome Measure  Help needed turning from your back to your side while in a flat bed without using bedrails?: A Little Help needed moving from lying on your back to sitting on the side of a flat bed without using bedrails?: A Little Help needed moving to and from a bed to a chair (including a wheelchair)?: A Lot Help needed standing up from a chair using your arms (e.g., wheelchair or bedside chair)?: A Lot Help needed to walk in hospital room?: A Lot Help needed climbing 3-5 steps with a railing? : Total 6 Click Score: 13    End of Session Equipment Utilized During Treatment: Gait belt Activity Tolerance: Patient tolerated treatment well Patient left: with chair alarm set;in chair;with call bell/phone within reach;Other (comment) (Posey self release reminder belt.) Nurse Communication: Mobility status PT Visit Diagnosis: Unsteadiness on feet (R26.81);Muscle weakness (generalized) (M62.81);Other symptoms and signs involving the nervous system (R29.898)     Time: 1535-1601 PT Time Calculation (min) (ACUTE ONLY): 26 min  Charges:  $Gait Training: 8-22 mins $Therapeutic Activity: 8-22 mins                     08/23/2021  Jacinto Halim., PT Acute Rehabilitation Services [phone]  (pager) [phone]   (office)   Jerry Humphrey Jerry Humphrey 08/23/2021, 4:46 PM

## 2021-08-23 NOTE — Progress Notes (Signed)
Family Medicine Teaching Service Daily Progress Note Intern Pager: [phone]  Patient name: Jerry Humphrey Medical record number: [id number] Date of birth: June 26, 1961 Age: 61 y.o. Gender: male  Primary Care Provider: Pcp, No Consultants: Neurology s/o  Code Status: Full  Pt Overview and Major Events to Date:  08/13/21: Admitted to FPTS  08/23/21: Awaiting SNF   Assessment and Plan: Jerry Humphrey is a 61 year old male presenting with left-sided weakness and found to have hemorrhagic stroke.  Past medical history significant for seizure due to alcohol withdrawal, hypertension, alcohol use and tobacco use.  Patient is medically optimized for SNF.  Left upper extremity weakness  dysarthria  hemorrhagic stroke Patient is followed by PT/OT/SLP, SLP has recommended regular diet with thin liquids. Currently awaiting insurance and disability application and is medically stable for SNF  Hyponatremia (chronic since 2020) Stable with weekly labs (134 on 1/28)  HTN  BP range today 131-140/85-100(?) automatic (will get manual) -Continue amlodipine 10 mg daily  -Continue to monitor    FEN/GI: Regular diet  PPx: SCDs  Dispo:SNF  pending insurance .   Subjective:  Pt is doing well with no new requests.   Objective: Temp:  [97.8 F (36.6 C)-98.7 F (37.1 C)] 98.2 F (36.8 C) (02/01 1158) Pulse Rate:  [61-75] 64 (02/01 1158) Resp:  [15-16] 16 (02/01 1158) BP: (112-140)/(70-100) 112/70 (02/01 1158) SpO2:  [97 %-99 %] 97 % (02/01 1158)  General: Alert and oriented in no apparent distress Heart: Regular rate and rhythm with no murmurs appreciated Lungs: CTA bilaterally, no wheezing Abdomen: Bowel sounds present, no abdominal pain Skin: Warm and dry Neuro: Appropriately responsive   Laboratory: Recent Labs  Lab 08/17/21 0423  WBC 8.0  HGB 13.7  HCT 40.7  PLT 241   Recent Labs  Lab 08/18/21 0223 08/19/21 0641 08/22/21 0153  NA 131* 134* 134*  K 4.2 4.3 5.1  CL 100 102 97*   CO2 23 25 28   BUN 12 16 18   CREATININE 0.92 0.99 1.08  CALCIUM 9.0 9.3 9.3  GLUCOSE 103* 115* 115*      Imaging/Diagnostic Tests: No new   , MD 08/23/2021, 12:15 PM PGY-1, [facility] FPTS Intern pager: [phone], text pages welcome

## 2021-08-24 MED ORDER — HYDROCHLOROTHIAZIDE 12.5 MG PO TABS
12.5000 mg | ORAL_TABLET | Freq: Every day | ORAL | Status: DC
  Administered 2021-08-24: 12.5 mg via ORAL
  Filled 2021-08-24 (×2): qty 1

## 2021-08-24 NOTE — Progress Notes (Signed)
Family Medicine Teaching Service Daily Progress Note Intern Pager: [phone]  Patient name: Jerry Humphrey Medical record number: [id number] Date of birth: 10/01/1960 Age: 61 y.o. Gender: male  Primary Care Provider: Pcp, No Consultants: Neurology (s/o) Code Status: Full  Pt Overview and Major Events to Date:  08/13/21: Admitted to FPTS  08/23/21: Awaiting SNF  Assessment and Plan: Jerry Humphrey is a 61 year old male presenting with left-sided weakness and found to have a hemorrhagic stroke.  Past medical history significant for seizure due to alcohol withdrawal, hypertension, alcohol use and tobacco use.  The patient is medically optimized for SNF.  LUE weakness, dysarthria, hemorrhagic stroke Currently followed by PT/OT/SLP. Awaiting insurance and disability application. Medically stable for discharge.  - Thin liquid diet  Hyponatremia, chronic Stable.  - Weekly BMP to monitor  HTN BP range 124-151/87-98, most recently 124/87. Given hemorrhagic stroke, prefer stricter blood pressure control. - Amlodipine 10mg  daily - HCTZ 12.5mg  daily  FEN/GI: Regular diet, thin liquid PPx: SCDs  Dispo:SNF  pending placement and insurance  Subjective:  Patient is a bit upset today as he urinated twice on himself last night as the catheter came off and no one brought him a bedpan.  Otherwise he has no complaints today.  Objective: Temp:  [97.9 F (36.6 C)-99 F (37.2 C)] 98.4 F (36.9 C) (02/03 0750) Pulse Rate:  [64-80] 72 (02/03 0750) Resp:  [15-20] 20 (02/03 0750) BP: (124-151)/(87-98) 124/87 (02/03 0750) SpO2:  [96 %-99 %] 99 % (02/03 0750) Physical Exam: General: NAD elderly male, supine in bed Cardiovascular: RRR, no murmur appreciated Respiratory: Clear to auscultation bilaterally, no increased work of breathing Abdomen: Bowel sounds present, no tenderness to palpation Extremities: Moving all extremities equally and appropriately, no lower extremity edema  Laboratory: No  results for input(s): WBC, HGB, HCT, PLT in the last 168 hours. Recent Labs  Lab 08/19/21 0641 08/22/21 0153  NA 134* 134*  K 4.3 5.1  CL 102 97*  CO2 25 28  BUN 16 18  CREATININE 0.99 1.08  CALCIUM 9.3 9.3  GLUCOSE 115* 115*     Imaging/Diagnostic Tests: No results found.   08/24/21, DO 08/25/2021, 9:42 AM PGY-2, [facility] FPTS Intern pager: [phone], text pages welcome

## 2021-08-24 NOTE — Progress Notes (Signed)
Physical Therapy Treatment Patient Details Name: Jerry Humphrey MRN: [id number] DOB: Feb 16, 1961 Today's Date: 08/24/2021   History of Present Illness Pt is a 61 y/o male presenting on 1/22 with L sided weakness, facial droop and dysarthria.  CT head with R subcortical hemorrhage and midline shift. PMH includes: ETOH abuse, HTN, seizures.    PT Comments    Pt visibly fatigued this am, but agrees to participate.  Emphasis on transitions/transfer safety, standing activity in front of the sink/mirror for balance feedback, and progression of gait stability with frequent stops to regroup and work on w/shifting and un-weighting L LE, normalize balance to midline   Recommendations for follow up therapy are one component of a multi-disciplinary discharge planning process, led by the attending physician.  Recommendations may be updated based on patient status, additional functional criteria and insurance authorization.  Follow Up Recommendations  Skilled nursing-short term rehab (<3 hours/day)     Assistance Recommended at Discharge Frequent or constant Supervision/Assistance  Patient can return home with the following Two people to help with walking and/or transfers;A lot of help with bathing/dressing/bathroom;Assistance with cooking/housework;Direct supervision/assist for medications management;Assist for transportation;Help with stairs or ramp for entrance   Equipment Recommendations  Wheelchair cushion (measurements PT);Wheelchair (measurements PT)    Recommendations for Other Services       Precautions / Restrictions Precautions Precautions: Fall Precaution Comments: impulsivity, L inattention Restrictions Weight Bearing Restrictions: No     Mobility  Bed Mobility Overal bed mobility: Needs Assistance Bed Mobility: Supine to Sit     Supine to sit: Min guard     General bed mobility comments: Initially comes up symmetrically, then within secs starts to lean left.  Pt lacks  attention to the left/L UE to assist with midline sitting.  With cues, pt can use UE's to become more symmetrical and scoot generally in midline.    Transfers Overall transfer level: Needs assistance Equipment used: Rolling walker (2 wheels) Transfers: Sit to/from Stand Sit to Stand: Min assist           General transfer comment: assist to attempt moderate L lean toward midline.    Ambulation/Gait Ambulation/Gait assistance: Mod assist, +2 safety/equipment, +2 physical assistance Gait Distance (Feet): 75 Feet Assistive device: Rolling walker (2 wheels) Gait Pattern/deviations: Step-through pattern Gait velocity: decr Gait velocity interpretation: <1.31 ft/sec, indicative of household ambulator   General Gait Details: Pt with heavy lean L, mod assist to help w/shift R to unweight L LE.  L swing through with less adducted moment.  multimodal cues for normalizing the above pattern   Stairs             Wheelchair Mobility    Modified Rankin (Stroke Patients Only) Modified Rankin (Stroke Patients Only) Modified Rankin: Moderately severe disability     Balance Overall balance assessment: Needs assistance Sitting-balance support: Feet supported, Single extremity supported Sitting balance-Leahy Scale: Fair Sitting balance - Comments: able to maintain static sitting   Standing balance support: Single extremity supported, Bilateral upper extremity supported, During functional activity Standing balance-Leahy Scale: Poor Standing balance comment: stood at sink using mirror or feedback, pt lining up with therapist behind for 4 min                            Cognition Arousal/Alertness: Awake/alert Behavior During Therapy: WFL for tasks assessed/performed, Impulsive Overall Cognitive Status: Impaired/Different from baseline Area of Impairment: Attention, Safety/judgement, Awareness, Problem solving, Following commands, Orientation  Orientation Level: Situation Current Attention Level: Selective Memory: Decreased short-term memory Following Commands: Follows one step commands with increased time Safety/Judgement: Decreased awareness of deficits, Decreased awareness of safety Awareness: Emergent Problem Solving: Slow processing          Exercises      General Comments General comments (skin integrity, edema, etc.): vss on RA      Pertinent Vitals/Pain Pain Assessment Pain Assessment: No/denies pain Pain Intervention(s): Monitored during session    Home Living                          Prior Function            PT Goals (current goals can now be found in the care plan section) Acute Rehab PT Goals Patient Stated Goal: to go home PT Goal Formulation: With patient Time For Goal Achievement: 08/28/21 Potential to Achieve Goals: Good Progress towards PT goals: Progressing toward goals    Frequency    Min 4X/week      PT Plan Current plan remains appropriate    Co-evaluation              AM-PAC PT "6 Clicks" Mobility   Outcome Measure  Help needed turning from your back to your side while in a flat bed without using bedrails?: A Little Help needed moving from lying on your back to sitting on the side of a flat bed without using bedrails?: A Little Help needed moving to and from a bed to a chair (including a wheelchair)?: A Lot Help needed standing up from a chair using your arms (e.g., wheelchair or bedside chair)?: A Lot Help needed to walk in hospital room?: Total Help needed climbing 3-5 steps with a railing? : Total 6 Click Score: 12    End of Session Equipment Utilized During Treatment: Gait belt Activity Tolerance: Patient tolerated treatment well Patient left: with bed alarm set;in bed;with call bell/phone within reach Nurse Communication: Mobility status PT Visit Diagnosis: Unsteadiness on feet (R26.81);Other abnormalities of gait and mobility (R26.89);Muscle  weakness (generalized) (M62.81)     Time: 6834-1962 PT Time Calculation (min) (ACUTE ONLY): 16 min  Charges:  $Gait Training: 8-22 mins                     08/24/2021  Jacinto Halim., PT Acute Rehabilitation Services [phone]  (pager) [phone]  (office)   Eliseo Gum Madiline Saffran 08/24/2021, 11:07 AM

## 2021-08-24 NOTE — Plan of Care (Signed)
°  Problem: Coping: Goal: Will verbalize positive feelings about self Outcome: Progressing Goal: Will identify appropriate support needs Outcome: Progressing   Problem: Nutrition: Goal: Risk of aspiration will decrease Outcome: Progressing   Problem: Education: Goal: Knowledge of General Education information will improve Description: Including pain rating scale, medication(s)/side effects and non-pharmacologic comfort measures Outcome: Progressing

## 2021-08-24 NOTE — Progress Notes (Signed)
Occupational Therapy Treatment Patient Details Name: Jerry Humphrey MRN: [id number] DOB: 1960/09/22 Today's Date: 08/24/2021   History of present illness Pt is a 61 y/o male presenting on 1/22 with L sided weakness, facial droop and dysarthria.  CT head with R subcortical hemorrhage and midline shift. PMH includes: ETOH abuse, HTN, seizures.   OT comments  Patient received in bed and eager to participate. Patient was min assist to get to EOB and demonstrated left lateral leaning but was able to correct with verbal cues. Patient ambulated to sink and performed grooming and back to recliner with assistance for balance due to left lateral leaning. Patient instructed in therapy putty exercises for LUE hand to increase [facility] and gross grasp and pinch strength.  Acute OT to continue to follow.    Recommendations for follow up therapy are one component of a multi-disciplinary discharge planning process, led by the attending physician.  Recommendations may be updated based on patient status, additional functional criteria and insurance authorization.    Follow Up Recommendations  Skilled nursing-short term rehab (<3 hours/day)    Assistance Recommended at Discharge Frequent or constant Supervision/Assistance  Patient can return home with the following  A lot of help with walking and/or transfers;A lot of help with bathing/dressing/bathroom;Assistance with cooking/housework;Assistance with feeding;Direct supervision/assist for medications management;Direct supervision/assist for financial management;Assist for transportation;Help with stairs or ramp for entrance   Equipment Recommendations  Other (comment) (TBD)    Recommendations for Other Services      Precautions / Restrictions Precautions Precautions: Fall Precaution Comments: impulsivity, L inattention Restrictions Weight Bearing Restrictions: No       Mobility Bed Mobility Overal bed mobility: Needs Assistance Bed Mobility: Supine to  Sit     Supine to sit: Min assist     General bed mobility comments: assistance with trunk due to lateral leaning    Transfers Overall transfer level: Needs assistance Equipment used: Rolling walker (2 wheels) Transfers: Sit to/from Stand Sit to Stand: Min assist           General transfer comment: required assistance for balance during transfers due to left lateral leaning     Balance Overall balance assessment: Needs assistance Sitting-balance support: Feet supported, Single extremity supported Sitting balance-Leahy Scale: Fair Sitting balance - Comments: able to maintain static sitting Postural control: Left lateral lean Standing balance support: Single extremity supported Standing balance-Leahy Scale: Poor Standing balance comment: stood at sink for grooming with assistance for balance                           ADL either performed or assessed with clinical judgement   ADL Overall ADL's : Needs assistance/impaired     Grooming: Brushing hair;Standing;Moderate assistance Grooming Details (indicate cue type and reason): mod assist for balance to comb hair at sink                               General ADL Comments: focused on grooming due to patient stated he had been bathed earlier    Extremity/Trunk Assessment Upper Extremity Assessment LUE Deficits / Details: grossly 3-/5 MMT with poor coordination, sensation and neglect vs inattention LUE Sensation: decreased light touch;decreased proprioception LUE Coordination: decreased fine motor;decreased gross motor            Vision       Perception     Praxis      Cognition Arousal/Alertness:  Awake/alert Behavior During Therapy: WFL for tasks assessed/performed, Impulsive Overall Cognitive Status: Impaired/Different from baseline                   Orientation Level: Situation Current Attention Level: Selective   Following Commands: Follows one step commands with  increased time Safety/Judgement: Decreased awareness of deficits, Decreased awareness of safety Awareness: Emergent Problem Solving: Slow processing General Comments: ablet to recall month and year but not day or day of week        Exercises Exercises: Other exercises Other Exercises Other Exercises: instructed patient in left hand therapy putty exercises with yellow therapy for gross grasp and pinch strength and [facility]    Shoulder Instructions       General Comments      Pertinent Vitals/ Pain       Pain Assessment Pain Assessment: No/denies pain Pain Intervention(s): Monitored during session  Home Living                                          Prior Functioning/Environment              Frequency  Min 2X/week        Progress Toward Goals  OT Goals(current goals can now be found in the care plan section)  Progress towards OT goals: Progressing toward goals  Acute Rehab OT Goals Patient Stated Goal: get more rehab OT Goal Formulation: With patient Time For Goal Achievement: 08/28/21 Potential to Achieve Goals: Fair ADL Goals Pt Will Perform Grooming: with supervision;sitting Pt Will Perform Upper Body Bathing: with supervision;sitting;with set-up Pt Will Perform Lower Body Dressing: with min assist;sitting/lateral leans;sit to/from stand Pt Will Transfer to Toilet: with min assist;ambulating;bedside commode Pt/caregiver will Perform Home Exercise Program: Increased strength;Left upper extremity;With written HEP provided;Increased ROM Additional ADL Goal #1: Pt will locate 3 items on L side of table without cueing, using compensatory techniques as needed.  Plan Discharge plan needs to be updated;Frequency needs to be updated    Co-evaluation                 AM-PAC OT "6 Clicks" Daily Activity     Outcome Measure   Help from another person eating meals?: A Little Help from another person taking care of personal grooming?: A  Lot Help from another person toileting, which includes using toliet, bedpan, or urinal?: A Lot Help from another person bathing (including washing, rinsing, drying)?: A Lot Help from another person to put on and taking off regular upper body clothing?: A Lot Help from another person to put on and taking off regular lower body clothing?: A Lot 6 Click Score: 13    End of Session Equipment Utilized During Treatment: Gait belt;Rolling walker (2 wheels)  OT Visit Diagnosis: Other abnormalities of gait and mobility (R26.89);Muscle weakness (generalized) (M62.81);Hemiplegia and hemiparesis;Other symptoms and signs involving cognitive function Hemiplegia - Right/Left: Left Hemiplegia - dominant/non-dominant: Non-Dominant Hemiplegia - caused by: Nontraumatic intracerebral hemorrhage   Activity Tolerance Patient tolerated treatment well   Patient Left in chair;with call bell/phone within reach;with chair alarm set   Nurse Communication Mobility status        Time: [id number] OT Time Calculation (min): 28 min  Charges: OT General Charges $OT Visit: 1 Visit OT Treatments $Self Care/Home Management : 8-22 mins $Neuromuscular Re-education: 8-22 mins  Alfonse Flavorsick Ahni Bradwell, OTA Acute Rehabilitation Services  Pager [phone] Office  [phone]   Dewain Penning 08/24/2021, 10:19 AM

## 2021-08-24 NOTE — Progress Notes (Signed)
Family Medicine Teaching Service Daily Progress Note Intern Pager: [phone]  Patient name: Jerry Humphrey Medical record number: [id number] Date of birth: 1960/09/21 Age: 61 y.o. Gender: male  Primary Care Provider: Pcp, No Consultants: Neurology s/o  Code Status: Full  Pt Overview and Major Events to Date:  08/13/21: Admitted to FPTS  08/23/21: Awaiting SNF   Assessment and Plan:  Jerry Humphrey is a 61 year old male presenting with left-sided weakness and found to have a hemorrhagic stroke.  Past medical history significant for seizure due to alcohol withdrawal, hypertension, alcohol use and tobacco use.  The patient is medically optimized for SNF.  Left upper extremity weakness, dysarthria, hemorrhagic stroke Currently patient is being followed by PT/OT/SLP with recommendation of regular diet with thin liquids.  Currently he is awaiting insurance and disability application and is medically stable for SNF.  Hyponatremia that seems to be chronic since 2020 Stable with weekly labs, last sodium 134 on 1/31.  Hypertension Blood pressure range today 130s to 140s/90s to 100s.  Given hemorrhagic stroke, will attempt to have better blood pressure control. -Amlodipine 10 mg -Begin HCTZ 125 mg daily      FEN/GI: Regular diet PPx: SCDs Dispo: SNF pending placement and insurance  Subjective:  Patient is doing well today with no new requests.  He has been able to sit up in his chair and is eating breakfast.  Objective: Temp:  [97.9 F (36.6 C)-98.7 F (37.1 C)] 97.9 F (36.6 C) (02/02 1140) Pulse Rate:  [69-79] 73 (02/02 1140) Resp:  [16-18] 16 (02/02 1140) BP: (124-141)/(84-106) 139/96 (02/02 1140) SpO2:  [96 %-100 %] 97 % (02/02 1140)  General: Alert and oriented in no apparent distress, sitting up in chair Heart: Regular rate and rhythm with no murmurs appreciated Lungs: CTA bilaterally, no wheezing Abdomen: Bowel sounds present, no abdominal pain Skin: Warm and  dry Extremities: No lower extremity edema   Laboratory: No results for input(s): WBC, HGB, HCT, PLT in the last 168 hours. Recent Labs  Lab 08/18/21 0223 08/19/21 0641 08/22/21 0153  NA 131* 134* 134*  K 4.2 4.3 5.1  CL 100 102 97*  CO2 23 25 28   BUN 12 16 18   CREATININE 0.92 0.99 1.08  CALCIUM 9.0 9.3 9.3  GLUCOSE 103* 115* 115*      , MD 08/24/2021, 2:29 PM PGY-1, [facility] FPTS Intern pager: [phone], text pages welcome

## 2021-08-24 NOTE — Plan of Care (Signed)
  Problem: Safety: Goal: Ability to remain free from injury will improve Outcome: Progressing   Problem: Skin Integrity: Goal: Risk for impaired skin integrity will decrease Outcome: Progressing   

## 2021-08-24 NOTE — Progress Notes (Signed)
Speech Language Pathology Treatment: Dysphagia;Cognitive-Linquistic  Patient Details Name: Jerry Humphrey MRN: [id number] DOB: Dec 01, 1960 Today's Date: 08/24/2021 Time: [id number] SLP Time Calculation (min) (ACUTE ONLY): 13 min  Assessment / Plan / Recommendation Clinical Impression  Pt said he was worn out from a busy morning but was still agreeable to participate in speech therapy. He consumed regular solids and thin liquids with good oral clearance with Mod I and no overt s/s of aspiration. Would continue regular solids and thin liquids with no further acute SLP f/u indicated for swallowing. However, pt does still need ongoing speech therapy for cognition and communication. He needed Mod cues to attend to his L environment. He has reduced awareness, but does show some basic problem solving once SLP would cue him to increase his awareness. He did not recall any of his speech intelligibility strategies but practiced them at the word and short phrase level with SLP once re-educated.    HPI HPI: 61 yo with PMHx of ETOH and Tobacco abuse, HTN, Alcohol withdrawal Seizures presented with left-sided weakness. He endorses a slight Headache this morning with a reduced sensation in his left extremities. MRI Biconvex intra-axial hemorrhage in the right basal ganglia with estimated blood volume of 29 mL. Surrounding edema and regional mass effect including mild leftward midline shift of 4 mm.      SLP Plan  Continue with current plan of care      Recommendations for follow up therapy are one component of a multi-disciplinary discharge planning process, led by the attending physician.  Recommendations may be updated based on patient status, additional functional criteria and insurance authorization.    Recommendations  Diet recommendations: Regular;Thin liquid Liquids provided via: Cup;Straw Medication Administration: Whole meds with puree Supervision: Patient able to self feed;Staff to assist with  self feeding;Full supervision/cueing for compensatory strategies Compensations: Slow rate;Small sips/bites;Lingual sweep for clearance of pocketing Postural Changes and/or Swallow Maneuvers: Seated upright 90 degrees                Oral Care Recommendations: Oral care BID Follow Up Recommendations: Skilled nursing-short term rehab (<3 hours/day) Assistance recommended at discharge: Set up Supervision/Assistance SLP Visit Diagnosis: Dysarthria and anarthria (R47.1);Cognitive communication deficit (R41.841);Dysphagia, unspecified (R13.10) Plan: Continue with current plan of care           Osie Bond., M.A. [facility] Services Pager [phone] Office [phone]  08/24/2021, 12:09 PM

## 2021-08-24 NOTE — Progress Notes (Signed)
FPTS Brief Note Reviewed patient's vitals, recent notes.  Vitals:   08/24/21 1140 08/24/21 1939  BP: (!) 139/96 (!) 151/98  Pulse: 73 80  Resp: 16 17  Temp: 97.9 F (36.6 C) 98.5 F (36.9 C)  SpO2: 97% 98%   At this time, no change in plan from day progress note.  Levin Erp, MD Page [phone] with questions about this patient.

## 2021-08-25 LAB — SARS CORONAVIRUS 2 (TAT 6-24 HRS): SARS Coronavirus 2: NEGATIVE

## 2021-08-25 MED ORDER — HYDROCHLOROTHIAZIDE 25 MG PO TABS
25.0000 mg | ORAL_TABLET | Freq: Every day | ORAL | Status: DC
  Administered 2021-08-26 – 2021-09-01 (×7): 25 mg via ORAL
  Filled 2021-08-25 (×7): qty 1

## 2021-08-25 NOTE — TOC Progression Note (Signed)
LATE NOTE SUBMISSION   Transition of Care [facility]) - Progression Note    Patient Details  Name: Jerry Humphrey MRN: [id number] Date of Birth: Jan 29, 1961  Transition of Care [facility]) CM/SW Contact  Baldemar Lenis, [place] Phone Number: 08/25/2021, 4:29 PM  Clinical Narrative:   CSW spoke about patient in QC meeting, asked by CSW director to send referral to [facility] and ask about them to review for LOG bed. CSW sent referral, asked them to review, awaiting decision. CSW to follow.    Expected Discharge Plan: Skilled Nursing Facility Barriers to Discharge: SNF Pending bed offer, SNF Pending payor source - LOG, SNF Pending Medicaid  Expected Discharge Plan and Services Expected Discharge Plan: Skilled Nursing Facility     Post Acute Care Choice: Skilled Nursing Facility Living arrangements for the past 2 months: Mobile Home                                       Social Determinants of Health (SDOH) Interventions    Readmission Risk Interventions No flowsheet data found.

## 2021-08-25 NOTE — TOC Progression Note (Signed)
Transition of Care [facility]) - Progression Note    Patient Details  Name: Jerry Humphrey MRN: [id number] Date of Birth: [birth year]  Transition of Care [facility]) CM/SW Contact  Baldemar Lenis, [place] Phone Number: 08/25/2021, 4:30 PM  Clinical Narrative:   CSW received call from [facility] that they can accept patient tomorrow. CSW updated MD, completed LOG form, and updated patient and brother. Patient agreeable to transfer tomorrow. CSW to follow.    Expected Discharge Plan: Skilled Nursing Facility Barriers to Discharge: SNF Pending bed offer, SNF Pending payor source - LOG, SNF Pending Medicaid  Expected Discharge Plan and Services Expected Discharge Plan: Skilled Nursing Facility     Post Acute Care Choice: Skilled Nursing Facility Living arrangements for the past 2 months: Mobile Home                                       Social Determinants of Health (SDOH) Interventions    Readmission Risk Interventions No flowsheet data found.

## 2021-08-25 NOTE — Progress Notes (Signed)
FPTS Brief Note Reviewed patient's vitals, recent notes.  Vitals:   08/25/21 1949 08/25/21 2314  BP: 129/79 121/83  Pulse: 74 74  Resp: 20 20  Temp: 98.5 F (36.9 C) (!) 97.5 F (36.4 C)  SpO2: 98% 100%   COVID negative. Pt likely to discharge to [facility] tomorrow.  Lyndee Hensen, DO Page [phone] with questions about this patient.

## 2021-08-25 NOTE — Plan of Care (Signed)
°  Problem: Coping: Goal: Will verbalize positive feelings about self Outcome: Progressing Goal: Will identify appropriate support needs Outcome: Progressing   Problem: Spontaneous Subarachnoid Hemorrhage Tissue Perfusion: Goal: Complications of Spontaneous Subarachnoid Hemorrhage will be minimized Outcome: Progressing   Problem: Clinical Measurements: Goal: Ability to maintain clinical measurements within normal limits will improve Outcome: Progressing Goal: Will remain free from infection Outcome: Progressing Goal: Diagnostic test results will improve Outcome: Progressing Goal: Respiratory complications will improve Outcome: Progressing Goal: Cardiovascular complication will be avoided Outcome: Progressing

## 2021-08-25 NOTE — TOC Initial Note (Signed)
LATE NOTE SUBMISSION   Transition of Care [facility]) - Initial/Assessment Note    Patient Details  Name: Jerry Humphrey MRN: [id number] Date of Birth: 1961-02-26  Transition of Care [facility]) CM/SW Contact:    Baldemar Lenis, LCSW Phone Number: 08/25/2021, 4:28 PM  Clinical Narrative:     CSW alerted by rehab admissions that patient will need SNF placement. Patient has no payor source, Medicaid pending. CSW has faxed out referral for LOG bed, but no bed offers at this time. CSW to follow.              Expected Discharge Plan: Skilled Nursing Facility Barriers to Discharge: SNF Pending bed offer, SNF Pending payor source - LOG, SNF Pending Medicaid   Patient Goals and CMS Choice Patient states their goals for this hospitalization and ongoing recovery are:: to get rehab CMS Medicare.gov Compare Post Acute Care list provided to:: Patient Choice offered to / list presented to : Patient  Expected Discharge Plan and Services Expected Discharge Plan: Skilled Nursing Facility     Post Acute Care Choice: Skilled Nursing Facility Living arrangements for the past 2 months: Mobile Home                                      Prior Living Arrangements/Services Living arrangements for the past 2 months: Mobile Home Lives with:: Self Patient language and need for interpreter reviewed:: No Do you feel safe going back to the place where you live?: Yes      Need for Family Participation in Patient Care: No (Comment) Care giver support system in place?: No (comment)   Criminal Activity/Legal Involvement Pertinent to Current Situation/Hospitalization: No - Comment as needed  Activities of Daily Living      Permission Sought/Granted Permission sought to share information with : Facility Medical sales representative, Family Supports Permission granted to share information with : Yes, Verbal Permission Granted  Share Information with NAME: Mellody Dance  Permission granted to share info w AGENCY:  SNF  Permission granted to share info w Relationship: Brother     Emotional Assessment Appearance:: Appears stated age Attitude/Demeanor/Rapport: Engaged Affect (typically observed): Appropriate Orientation: : Oriented to Self, Oriented to Place, Oriented to  Time, Oriented to Situation Alcohol / Substance Use: Alcohol Use Psych Involvement: No (comment)  Admission diagnosis:  Subcortical hemorrhage (HCC) [I61.0] Right-sided nontraumatic intracerebral hemorrhage, unspecified cerebral location [facility]) [I61.9] Patient Active Problem List   Diagnosis Date Noted   Knee pain    Right-sided nontraumatic intracerebral hemorrhage (HCC)    Hypertension    Alcohol use    Subcortical hemorrhage (HCC) 08/13/2021   Closed fracture of right distal tibia    Tibia/fibula fracture 02/01/2019   Alcohol abuse 01/30/2019   Chest pain 01/30/2019   Leukocytosis 01/30/2019   Tobacco abuse 01/30/2019   Closed fracture of right tibia and fibula 01/30/2019   Fall due to stumbling 01/30/2019   Seizure disorder (HCC) 01/30/2019   PCP:  Pcp, No Pharmacy:   [facility] [address] [place] [address] Phone: [phone] Fax: [phone]  [id number] [place] [place] [address] AT [facility] [address] [place] [address] Phone: [phone] Fax: [phone]     Social Determinants of Health (SDOH) Interventions    Readmission Risk Interventions No flowsheet data found.

## 2021-08-25 NOTE — Progress Notes (Addendum)
Family Medicine Teaching Service Daily Progress Note Intern Pager: [phone]  Patient name: Jerry Humphrey Medical record number: [id number] Date of birth: 1960/09/08 Age: 61 y.o. Gender: male  Primary Care Provider: Pcp, No Consultants: Neurology s/o Code Status: Full   Pt Overview and Major Events to Date:  08/13/21: Admitted to [facility]  08/23/21: Awaiting SNF; medically optimized   Assessment and Plan:  Jerry Humphrey is a 61 year old male presenting with left-sided weakness and found to have a hemorrhagic stroke.  Past medical history significant for seizure due to alcohol withdrawal, hypertension, alcohol use and tobacco use.  The patient is medically optimized for SNF.   LUE weakness, dysarthria, hemorrhagic stroke Currently followed by PT/OT/SLP. Awaiting insurance and disability application. Medically stable for discharge.  - regular diet    Hyponatremia, chronic Stable.  - Weekly BMP to monitor   HTN BP range 120s/70s-80s. Given hemorrhagic stroke, prefer stricter blood pressure control. - Amlodipine 10mg  daily - HCTZ 12.5mg  daily    FEN/GI: Regular  PPx: SCDs Dispo:Awaiting SNF placement   Subjective:  Pt is doing well this AM, and was enjoying breakfast.   Objective: Temp:  [97.5 F (36.4 C)-98.7 F (37.1 C)] 98.7 F (37.1 C) (02/04 1603) Pulse Rate:  [67-75] 75 (02/04 1603) Resp:  [16-20] 20 (02/04 1603) BP: (120-129)/(71-102) 122/86 (02/04 1603) SpO2:  [97 %-100 %] 97 % (02/04 1603) Physical Exam: General: Alert and oriented in no apparent distress, sitting up in chair  Heart: Regular rate and rhythm with no murmurs appreciated Lungs: CTA bilaterally, no wheezing Abdomen: nondistended, no abdominal pain Skin: Warm and dry Extremities: Good ROM   Laboratory: No results for input(s): WBC, HGB, HCT, PLT in the last 168 hours. Recent Labs  Lab 08/22/21 0153  NA 134*  K 5.1  CL 97*  CO2 28  BUN 18  CREATININE 1.08  CALCIUM 9.3  GLUCOSE 115Erskine Emery, MD 08/26/2021, 4:27 PM PGY-1, [place] Intern pager: [phone], text pages welcome  [place] PAGER [phone] for any questions or notifications regarding this patient   FMTS Attending Daily Note: Dorcas Mcmurray MD  Attending pager:[phone]  office [phone]  I  have seen and examined this patient, reviewed their chart. I have discussed this patient with the resident. I agree with the resident's findings, assessment and care plan.   Patient initially scheduled to be admitted to SNF but due to an issue at SNF they are unable to take him today. SW is addressing.

## 2021-08-26 DIAGNOSIS — I619 Nontraumatic intracerebral hemorrhage, unspecified: Secondary | ICD-10-CM

## 2021-08-26 DIAGNOSIS — I612 Nontraumatic intracerebral hemorrhage in hemisphere, unspecified: Secondary | ICD-10-CM

## 2021-08-26 MED ORDER — HYDROCHLOROTHIAZIDE 25 MG PO TABS: 25.0000 mg | ORAL_TABLET | Freq: Every day | ORAL | 0 refills | Status: DC

## 2021-08-26 MED ORDER — THIAMINE HCL 100 MG PO TABS: 100.0000 mg | ORAL_TABLET | Freq: Every day | ORAL | 0 refills | Status: AC

## 2021-08-26 MED ORDER — AMLODIPINE BESYLATE 10 MG PO TABS: 10.0000 mg | ORAL_TABLET | Freq: Every day | ORAL | 0 refills | Status: DC

## 2021-08-26 MED ORDER — ROSUVASTATIN CALCIUM 20 MG PO TABS: 20.0000 mg | ORAL_TABLET | Freq: Every day | ORAL | 0 refills | Status: DC

## 2021-08-26 MED ORDER — FOLIC ACID 1 MG PO TABS: 1.0000 mg | ORAL_TABLET | Freq: Every day | ORAL | 0 refills | Status: AC

## 2021-08-26 NOTE — Discharge Instructions (Signed)
Thank you for letting us care for you during your stay!  You were admitted to the [facility] Medicine Teaching Service.   You were admitted for a stroke that caused you to have left sided weakness.   We recommend follow up specifically for your stroke with Neurology and your primary care physician for a hospital follow up.   Follow-up Information     [facility]. Schedule an appointment as soon as possible for a visit in 1 month(s).   Specialty: Neurology Why: stroke clinic Contact information: [address] [place] [address] [phone]                 Other important diagnoses identified during your stay were high blood pressure and low sodium levels. We treated your blood pressure with medications called HCTZ and amlodipine to be taken when you leave.   Please follow up with your primary care physician in 1 week.   If your symptoms worsen or return, please return to the hospital. If you begin to have worsening weakness, dizziness, blurry vision, chest pain, shortness of breath, confusion, please seek assistance with healthcare provider immediately.   Please let us know if you have questions about your stay at [facility].

## 2021-08-26 NOTE — Discharge Summary (Signed)
Family Medicine Teaching [facility] Discharge Summary  Patient name: Jerry Humphrey Medical record number: [id number] Date of birth: 02/25/1961 Age: 61 y.o. Gender: male Date of Admission: 08/13/2021  Date of Discharge: 08/26/21 Admitting Physician: Jerre Simon, MD  Primary Care Provider: Pcp, No Consultants: Neurology   Indication for Hospitalization: Left sided weakness  Discharge Diagnoses/Problem List:  Principal Problem:   Subcortical hemorrhage (HCC) Active Problems:   Hypertension   Alcohol use   Knee pain   Right-sided nontraumatic intracerebral hemorrhage (HCC)   Disposition: SNF  Discharge Condition: Stable   Discharge Exam:  Blood pressure 120/84, pulse 69, temperature 98.6 F (37 C), temperature source Oral, resp. rate 16, height 5\' 8"  (1.727 m), weight 79.9 kg, SpO2 97 %. General: Alert and oriented in no apparent distress, sitting up in chair  Heart: Regular rate and rhythm with no murmurs appreciated Lungs: CTA bilaterally, no wheezing Abdomen: nondistended, no abdominal pain Skin: Warm and dry Extremities: Good ROM    Brief Hospital Course:  Jerry Humphrey is a 61 y.o. male presenting with Left side weakness . PMH is significant for seizure due to alcohol withdrawal, HTN, Alcohol use and Tobacco use. His hospital course is below   Left side weakness 2/2 hemorrhagic stroke Patient reports falling 4 days prior to admission and was unable to get up due to left sided weakness.  He endorses paresthesia of extremities and left upper extremity weakness.  He also reports right upper extremity tingling but full strength and function of the right upper extremity.  On exam patient had left upper extremity weakness, profound dysarthria, facial droop and left tongue deviation. Muscle strength on RUE, RLE and LLE is 5/5 and 3/5 on LUE.  Initial labs show mildly elevated troponin 66 >68, CMP was unremarkable with no electrolyte abnormalities, other than slightly  elevated AST, ALT 64 and 74 respectively.  Creatinine was 1.28 and EKG showed sinus rhythm with no ST changes.  CT neck was unremarkable however CT head showed right subcortical hemorrhage with right to left midline shift.  Neurology was consulted who recommend avoiding anticoagulant and antiplatelet. PT/OT/SLP were consulted and saw patient during hospitalization. Patient had good progress with PT/OT/SLP and was recommended for SNF. By day of discharge, patient's neuro exam was stable.   AKI On admission Creatinine was 1.28 and improved to 1.20 on recheck. GFR >60. Patient Cr had improved and AKI resolved by discharge.  Hyponatremia Unlikely SIADH due to normal serum osmolality (should be low), with high urine osmolality. We monitored Na regularly and it was maintained from high 120s-low 130s. Patient was asymptomatic and this was stable at time of discharge.   HTN BP range on admission has ranged from normotensive to hypertensive requiring medications inpatient. Home medication include amlodipine 5 mg daily.  However patient has not had this medication in the last 2 years. Patient was started on amlodipine 10 mg as well as HCTZ 25 mg and had good control of BP with those two medications. By discharge patient BP had been well controlled.    Hx of Alcohol use  Hx of alcohol withdrawal with seizure Patient reports prior history of alcohol withdrawal with seizure requiring hospitalization.  He drinks on average two 64 ounce beer daily.  He reports his last drink was 4 days ago where he had to 64 ounce beer. While inpatient, patient CIWAS were monitored and remained low, requiring no treatment for/concern of ETOH withdrawal.   Right Knee Pain XR showed no evidence of fracture  or bony abnormality. Provided Diclofenac gel for pain relief.    Issues for follow up: Wheelchair at d/c w/ cushion Repeat MRI and CTA of head and neck once hematoma resolves in outpatient setting. Monitor BP to maintain  strict control due to previous hemorrhagic stroke  Hyponatremia to be followed outpatient with BMP    Significant Procedures: None  Significant Labs and Imaging:  No results for input(s): WBC, HGB, HCT, PLT in the last 168 hours. Recent Labs  Lab 08/22/21 0153  NA 134*  K 5.1  CL 97*  CO2 28  GLUCOSE 115*  BUN 18  CREATININE 1.08  CALCIUM 9.3   R knee XR 1/26 IMPRESSION: No recent fracture or dislocation is seen. There is no significant effusion in the right knee joint. There is soft tissue swelling along the anterior aspect of the knee, possibly suggesting contusion or cellulitis.  CT head w/o contrast 1/23 1. Unchanged right basal ganglia hemorrhage and 4 mm of leftward midline shift. 2. No new intracranial abnormality. 3. Severe chronic small vessel ischemic disease.  MR brain wo contrast 1/23 1. Biconvex intra-axial hemorrhage in the right basal ganglia with estimated blood volume of 29 mL. Surrounding edema and regional mass effect including mild leftward midline shift of 4 mm. No intraventricular extension. No ventriculomegaly or other complicating features. 2. Underlying advanced chronic small vessel disease.   CTA angio head and neck 08/13/21 1. Negative CTA for large vessel occlusion. 2. Atheromatous disease about the carotid bifurcations and carotid siphons without hemodynamically significant or correctable stenosis. 3. No vascular abnormality seen underlying the right basal ganglia hemorrhage.  Aortic Atherosclerosis   Results/Tests Pending at Time of Discharge: None  Discharge Medications:  Allergies as of 08/26/2021       Reactions   Codeine Rash        Medication List     TAKE these medications    amLODipine 10 MG tablet Commonly known as: NORVASC Take 1 tablet (10 mg total) by mouth daily.   folic acid 1 MG tablet Commonly known as: FOLVITE Take 1 tablet (1 mg total) by mouth daily.   hydrochlorothiazide 25 MG tablet Commonly known  as: HYDRODIURIL Take 1 tablet (25 mg total) by mouth daily.   rosuvastatin 20 MG tablet Commonly known as: CRESTOR Take 1 tablet (20 mg total) by mouth daily.   thiamine 100 MG tablet Take 1 tablet (100 mg total) by mouth daily.        Discharge Instructions: Please refer to Patient Instructions section of EMR for full details.  Patient was counseled important signs and symptoms that should prompt return to medical care, changes in medications, dietary instructions, activity restrictions, and follow up appointments.   Follow-Up Appointments:  Follow-up Information     [facility]. Schedule an appointment as soon as possible for a visit in 1 month(s).   Specialty: Neurology Why: stroke clinic Contact information: [address] [place] [address] [phone]               Follow up with PCP for monitoring of BP and Hyponatremia   Alfredo Martinez, MD 08/26/2021, 12:20 PM PGY-1, [facility]

## 2021-08-26 NOTE — TOC Progression Note (Addendum)
Transition of Care [facility]) - Initial/Assessment Note    Patient Details  Name: MASSAI MANDUJANO MRN: [id number] Date of Birth: [birth year]  Transition of Care [facility]) CM/SW Contact:    Milinda Antis, LCSWA Phone Number: 08/26/2021, 10:32 AM  Clinical Narrative:                 10:32-  CSW called Debbie with admissions at [facility] to coordinate patient's transfer to the facility.  There was no answer.  CSW left a VM requesting a returned call.    10:50-  Returned call received from Celina with [facility].  The facility is awaiting the signed LOG form which is needed prior to the patient being admitted.  TOC leadership notified and following up.  16:00-  CSW notified by [facility] leadership that the facility is unable to accept the patient today.    Expected Discharge Plan: Skilled Nursing Facility Barriers to Discharge: SNF Pending bed offer, SNF Pending payor source - LOG, SNF Pending Medicaid   Patient Goals and CMS Choice Patient states their goals for this hospitalization and ongoing recovery are:: to get rehab CMS Medicare.gov Compare Post Acute Care list provided to:: Patient Choice offered to / list presented to : Patient  Expected Discharge Plan and Services Expected Discharge Plan: [place] Choice: [place] Living arrangements for the past 2 months: Mobile Home                                      Prior Living Arrangements/Services Living arrangements for the past 2 months: Mobile Home Lives with:: Self Patient language and need for interpreter reviewed:: No Do you feel safe going back to the place where you live?: Yes      Need for Family Participation in Patient Care: No (Comment) Care giver support system in place?: No (comment)   Criminal Activity/Legal Involvement Pertinent to Current Situation/Hospitalization: No - Comment as needed  Activities of Daily Living Home Assistive Devices/Equipment: Cane  (specify quad or straight) ADL Screening (condition at time of admission) Patient's cognitive ability adequate to safely complete daily activities?: Yes Is the patient deaf or have difficulty hearing?: No Does the patient have difficulty seeing, even when wearing glasses/contacts?: No Does the patient have difficulty concentrating, remembering, or making decisions?: No Patient able to express need for assistance with ADLs?: Yes Does the patient have difficulty dressing or bathing?: Yes Independently performs ADLs?: No Communication: Independent Dressing (OT): Needs assistance Is this a change from baseline?: Change from baseline, expected to last >3 days Grooming: Needs assistance Is this a change from baseline?: Change from baseline, expected to last >3 days Feeding: Independent Bathing: Needs assistance Is this a change from baseline?: Change from baseline, expected to last >3 days Toileting: Needs assistance Is this a change from baseline?: Change from baseline, expected to last >3days In/Out Bed: Needs assistance Is this a change from baseline?: Change from baseline, expected to last >3 days Walks in Home: Needs assistance Is this a change from baseline?: Change from baseline, expected to last >3 days Does the patient have difficulty walking or climbing stairs?: Yes Weakness of Legs: Both Weakness of Arms/Hands: Left  Permission Sought/Granted Permission sought to share information with : Facility Sport and exercise psychologist, Family Supports Permission granted to share information with : Yes, Verbal Permission Granted  Share Information with NAME: [name]  Permission  granted to share info w AGENCY: SNF  Permission granted to share info w Relationship: Brother     Emotional Assessment Appearance:: Appears stated age Attitude/Demeanor/Rapport: Engaged Affect (typically observed): Appropriate Orientation: : Oriented to Self, Oriented to Place, Oriented to  Time, Oriented to  Situation Alcohol / Substance Use: Alcohol Use Psych Involvement: No (comment)  Admission diagnosis:  Subcortical hemorrhage (HCC) [I61.0] Right-sided nontraumatic intracerebral hemorrhage, unspecified cerebral location [facility]) [I61.9] Patient Active Problem List   Diagnosis Date Noted   Knee pain    Right-sided nontraumatic intracerebral hemorrhage ([place])    Hypertension    Alcohol use    Subcortical hemorrhage ([place]) 08/13/2021   Closed fracture of right distal tibia    Tibia/fibula fracture 02/01/2019   Alcohol abuse 01/30/2019   Chest pain 01/30/2019   Leukocytosis 01/30/2019   Tobacco abuse 01/30/2019   Closed fracture of right tibia and fibula 01/30/2019   Fall due to stumbling 01/30/2019   Seizure disorder ([place]) 01/30/2019   PCP:  Pcp, No Pharmacy:   [facility] [address] [place] [address] Phone: [phone] Fax: [phone]  [id number] [place], [place] [id number] [id number] Phone: [phone] Fax: [phone]     Social Determinants of Health (SDOH) Interventions    Readmission Risk Interventions No flowsheet data found.

## 2021-08-26 NOTE — Discharge Summary (Incomplete Revision)
Family Medicine Teaching [facility] Discharge Summary *** Change Date  Patient name: Jerry Humphrey Medical record number: [id number] Date of birth: Mar 20, 1961 Age: 61 y.o. Gender: male Date of Admission: 08/13/2021  Date of Discharge: 08/26/21 Admitting Physician: Jerre Simon, MD  Primary Care Provider: Pcp, No Consultants: Neurology   Indication for Hospitalization: Left sided weakness  Discharge Diagnoses/Problem List:  Principal Problem:   Subcortical hemorrhage (HCC) Active Problems:   Hypertension   Alcohol use   Knee pain   Right-sided nontraumatic intracerebral hemorrhage (HCC)   Disposition: SNF  Discharge Condition: Stable   Discharge Exam:  Blood pressure 120/84, pulse 69, temperature 98.6 F (37 C), temperature source Oral, resp. rate 16, height 5\' 8"  (1.727 m), weight 79.9 kg, SpO2 97 %. ***   Brief Hospital Course:  Jerry Humphrey is a 61 y.o. male presenting with Left side weakness . PMH is significant for seizure due to alcohol withdrawal, HTN, Alcohol use and Tobacco use. His hospital course is below   Left side weakness 2/2 hemorrhagic stroke Patient reports falling 4 days prior to admission and was unable to get up due to left sided weakness.  He endorses paresthesia of extremities and left upper extremity weakness.  He also reports right upper extremity tingling but full strength and function of the right upper extremity.  On exam patient had left upper extremity weakness, profound dysarthria, facial droop and left tongue deviation. Muscle strength on RUE, RLE and LLE is 5/5 and 3/5 on LUE.  Initial labs show mildly elevated troponin 66 >68, CMP was unremarkable with no electrolyte abnormalities, other than slightly elevated AST, ALT 64 and 74 respectively.  Creatinine was 1.28 and EKG showed sinus rhythm with no ST changes.  CT neck was unremarkable however CT head showed right subcortical hemorrhage with right to left midline shift.  Neurology was  consulted who recommend avoiding anticoagulant and antiplatelet. PT/OT/SLP were consulted and saw patient during hospitalization. Patient had good progress with PT/OT/SLP and was recommended for SNF. By day of discharge, patient's neuro exam was stable.   AKI On admission Creatinine was 1.28 and improved to 1.20 on recheck. GFR >60. Patient Cr had improved and AKI resolved by discharge.  Hyponatremia Unlikely SIADH due to normal serum osmolality (should be low), with high urine osmolality. We monitored Na regularly and it was maintained from high 120s-low 130s. Patient was asymptomatic and this was stable at time of discharge.   HTN BP range on admission has ranged from normotensive to hypertensive requiring medications inpatient. Home medication include amlodipine 5 mg daily.  However patient has not had this medication in the last 2 years. Patient was started on amlodipine 10 mg as well as HCTZ 25 mg and had good control of BP with those two medications. By discharge patient BP had been well controlled.    Hx of Alcohol use  Hx of alcohol withdrawal with seizure Patient reports prior history of alcohol withdrawal with seizure requiring hospitalization.  He drinks on average two 64 ounce beer daily.  He reports his last drink was 4 days ago where he had to 64 ounce beer. While inpatient, patient CIWAS were monitored and remained low, requiring no treatment for/concern of ETOH withdrawal.   Right Knee Pain XR showed no evidence of fracture or bony abnormality. Provided Diclofenac gel for pain relief.    Issues for follow up: Wheelchair at d/c w/ cushion Repeat MRI and CTA of head and neck once hematoma resolves in outpatient setting. Monitor  BP to maintain strict control due to previous hemorrhagic stroke  Hyponatremia to be followed outpatient with BMP    Significant Procedures: None  Significant Labs and Imaging:  No results for input(s): WBC, HGB, HCT, PLT in the last 168  hours. Recent Labs  Lab 08/22/21 0153  NA 134*  K 5.1  CL 97*  CO2 28  GLUCOSE 115*  BUN 18  CREATININE 1.08  CALCIUM 9.3   R knee XR 1/26 IMPRESSION: No recent fracture or dislocation is seen. There is no significant effusion in the right knee joint. There is soft tissue swelling along the anterior aspect of the knee, possibly suggesting contusion or cellulitis.  CT head w/o contrast 1/23 1. Unchanged right basal ganglia hemorrhage and 4 mm of leftward midline shift. 2. No new intracranial abnormality. 3. Severe chronic small vessel ischemic disease.  MR brain wo contrast 1/23 1. Biconvex intra-axial hemorrhage in the right basal ganglia with estimated blood volume of 29 mL. Surrounding edema and regional mass effect including mild leftward midline shift of 4 mm. No intraventricular extension. No ventriculomegaly or other complicating features. 2. Underlying advanced chronic small vessel disease.   CTA angio head and neck 08/13/21 1. Negative CTA for large vessel occlusion. 2. Atheromatous disease about the carotid bifurcations and carotid siphons without hemodynamically significant or correctable stenosis. 3. No vascular abnormality seen underlying the right basal ganglia hemorrhage.  Aortic Atherosclerosis   Results/Tests Pending at Time of Discharge: None  Discharge Medications:  Allergies as of 08/26/2021       Reactions   Codeine Rash        Medication List     TAKE these medications    amLODipine 10 MG tablet Commonly known as: NORVASC Take 1 tablet (10 mg total) by mouth daily.   folic acid 1 MG tablet Commonly known as: FOLVITE Take 1 tablet (1 mg total) by mouth daily.   hydrochlorothiazide 25 MG tablet Commonly known as: HYDRODIURIL Take 1 tablet (25 mg total) by mouth daily.   rosuvastatin 20 MG tablet Commonly known as: CRESTOR Take 1 tablet (20 mg total) by mouth daily.   thiamine 100 MG tablet Take 1 tablet (100 mg total) by mouth  daily.        Discharge Instructions: Please refer to Patient Instructions section of EMR for full details.  Patient was counseled important signs and symptoms that should prompt return to medical care, changes in medications, dietary instructions, activity restrictions, and follow up appointments.   Follow-Up Appointments:  Follow-up Information     [facility]. Schedule an appointment as soon as possible for a visit in 1 month(s).   Specialty: Neurology Why: stroke clinic Contact information: [address] [place] [address] [phone]               Follow up with PCP for monitoring of BP and Hyponatremia   Alfredo Martinez, MD 08/26/2021, 12:20 PM PGY-1, [facility] Family [facility]

## 2021-08-26 NOTE — Plan of Care (Signed)
Problem: Acute Rehab OT Goals (only OT should resolve) Goal: Pt. Will Perform Grooming Outcome: Adequate for Discharge Goal: Pt. Will Perform Upper Body Bathing Outcome: Adequate for Discharge Goal: Pt. Will Perform Lower Body Dressing Outcome: Adequate for Discharge Goal: Pt. Will Transfer To Toilet Outcome: Adequate for Discharge Goal: Pt/Caregiver Will Perform Home Exercise Program Outcome: Adequate for Discharge Goal: OT Additional ADL Goal #1 Outcome: Adequate for Discharge   Problem: Acute Rehab PT Goals(only PT should resolve) Goal: Pt Will Go Supine/Side To Sit Outcome: Adequate for Discharge Goal: Pt Will Go Sit To Supine/Side Outcome: Adequate for Discharge Goal: Patient Will Transfer Sit To/From Stand Outcome: Adequate for Discharge Goal: Pt Will Transfer Bed To Chair/Chair To Bed Outcome: Adequate for Discharge Goal: Pt Will Ambulate Outcome: Adequate for Discharge   Problem: SLP Dysphagia Goals Goal: Patient will utilize recommended strategies Description: Patient will utilize recommended strategies during swallow to increase swallowing safety with Outcome: Adequate for Discharge   Problem: SLP Cognition Goals Goal: Patient will demonstrate problem solving skills Description: Patient will demonstrate problem solving skills during functional ADL's with Outcome: Adequate for Discharge Goal: Misc Cognitive Goal #1 Outcome: Adequate for Discharge   Problem: SLP Dysphagia Goals Goal: Patient will utilize recommended strategies Description: Patient will utilize recommended strategies during swallow to increase swallowing safety with Outcome: Adequate for Discharge   Problem: SLP Language Goals Goal: Patient will utilize speech intelligibility Description: Patient will utilize speech intelligibility strategies to  enhance communication with Outcome: Adequate for Discharge   Problem: Coping: Goal: Will verbalize positive feelings about self Outcome: Adequate  for Discharge Goal: Will identify appropriate support needs Outcome: Adequate for Discharge   Problem: Nutrition: Goal: Risk of aspiration will decrease Outcome: Adequate for Discharge   Problem: Ischemic Stroke/TIA Tissue Perfusion: Goal: Complications of ischemic stroke/TIA will be minimized Outcome: Adequate for Discharge   Problem: Spontaneous Subarachnoid Hemorrhage Tissue Perfusion: Goal: Complications of Spontaneous Subarachnoid Hemorrhage will be minimized Outcome: Adequate for Discharge   Problem: Education: Goal: Knowledge of General Education information will improve Description: Including pain rating scale, medication(s)/side effects and non-pharmacologic comfort measures Outcome: Adequate for Discharge   Problem: Health Behavior/Discharge Planning: Goal: Ability to manage health-related needs will improve Outcome: Adequate for Discharge   Problem: Clinical Measurements: Goal: Ability to maintain clinical measurements within normal limits will improve Outcome: Adequate for Discharge Goal: Will remain free from infection Outcome: Adequate for Discharge Goal: Diagnostic test results will improve Outcome: Adequate for Discharge Goal: Respiratory complications will improve Outcome: Adequate for Discharge Goal: Cardiovascular complication will be avoided Outcome: Adequate for Discharge   Problem: Activity: Goal: Risk for activity intolerance will decrease Outcome: Adequate for Discharge   Problem: Nutrition: Goal: Adequate nutrition will be maintained Outcome: Adequate for Discharge   Problem: Coping: Goal: Level of anxiety will decrease Outcome: Adequate for Discharge   Problem: Elimination: Goal: Will not experience complications related to bowel motility Outcome: Adequate for Discharge Goal: Will not experience complications related to urinary retention Outcome: Adequate for Discharge   Problem: Pain Managment: Goal: General experience of comfort will  improve Outcome: Adequate for Discharge   Problem: Safety: Goal: Ability to remain free from injury will improve Outcome: Adequate for Discharge   Problem: Skin Integrity: Goal: Risk for impaired skin integrity will decrease Outcome: Adequate for Discharge  Discharge instructions reviewed with patient.  These included, but were not limited to, the following:  discharge medications, when to call the MD,  S&S of stroke, activity recommendations, dietary recommendations, ambulatory status,  necessity of calling for assistance for eating / activity, follow-up appointments, etc.

## 2021-08-26 NOTE — Progress Notes (Signed)
FPTS Brief Note Reviewed patient's vitals, recent notes.  Vitals:   08/26/21 2130 08/26/21 2337  BP: (!) 144/92 97/73  Pulse: 71 78  Resp: 16 16  Temp: 98.3 F (36.8 C) 98.4 F (36.9 C)  SpO2: 100% 99%   At this time, no change in plan from day progress note.  Cora Collum, DO Page [phone] with questions about this patient.

## 2021-08-27 DIAGNOSIS — F17200 Nicotine dependence, unspecified, uncomplicated: Secondary | ICD-10-CM

## 2021-08-27 NOTE — Progress Notes (Addendum)
Family Medicine Teaching Service Daily Progress Note Intern Pager: [phone]  Patient name: Jerry Humphrey Medical record number: [id number] Date of birth: [birth year] Age: 61 y.o. Gender: male  Primary Care Provider: Pcp, No Consultants: Neurology (signed off)  Code Status: FULL CODE   Pt Overview and Major Events to Date:  08/13/21: Admitted 2/1: Stable for SNF placement  Assessment and Plan: Jerry Humphrey is a 61 y.o. male who was admitted for left-sided weakness, found to have a hemorrhagic CVA.  His past medical history significant for alcohol withdrawal seizure, hypertension, alcohol use disorder, tobacco use disorder.  At this time, patient is stable for placement to SNF.  Hemorrhagic CVA  Thought to be related to poorly controlled HTN and alcohol use disorder. PT/OT/SLP working with patient, has been recommended for discharge to SNF. SNF placement delayed as LOG form needed prior to admission. Patient stable from CVA, neurology has signed off. No new neurological concerns at this time.  -Needs repeat MRI and CTA head and neck outpatient once hematoma resolves  -Continue rosuvastatin 20 mg daily  -Not on anti-platelet therapy   Chronic Hyponatremia: stable  Na 134 on 1/31. Has been chronically low but stable.  -Weekly BMP; next due 2/7  HTN: Chronic, stable  BP stable.  -Continue Amlodipine 10 mg daily -Continue HCTZ 12.5 mg daily   Alcohol Use Disorder   Tobacco Use Disorder  -Encourage cessation -Continue thiamine, folic acid and MVI supplementation daily   FEN/GI: Regular diet  PPx: SCD Dispo:SNF. Barriers include bed availability.   Subjective:  Patient feels okay. States his legs get restless. Wishes he could work with PT more often. He would like his family updated once he goes to SNF so they know where he's at. Last BM yesterday.   Objective: Temp:  [98.3 F (36.8 C)-98.7 F (37.1 C)] 98.4 F (36.9 C) (02/04 2337) Pulse Rate:  [67-78] 78 (02/04  2337) Resp:  [16-20] 16 (02/04 2337) BP: (97-144)/(71-102) 97/73 (02/04 2337) SpO2:  [97 %-100 %] 99 % (02/04 2337) Physical Exam: General: Disheveled, poor dentition, pleasant, NAD Cardiovascular: RRR without murmur Respiratory: CTAB anterior fields, normal work of breathing Abdomen: Normoactive bowel sounds, soft, mild tenderness to suprapubic region without R/G Extremities: Warm, no edema Neuro: Dysarthric, 5/5 strength right UE, b/l lower extremities, 4/5 strength left UE, no facial droop, normal sensation, able to follow commands, EOMI  Laboratory: No results for input(s): WBC, HGB, HCT, PLT in the last 168 hours. Recent Labs  Lab 08/22/21 0153  NA 134*  K 5.1  CL 97*  CO2 28  BUN 18  CREATININE 1.08  CALCIUM 9.3  GLUCOSE 115*    Imaging/Diagnostic Tests: No results found.   Jerry Dick, DO 08/27/2021, 2:55 AM PGY-2, [place] [facility] FPTS Intern pager: [phone], text pages welcome CALL PAGER [phone] for any questions or notifications regarding this patient   FMTS Attending Daily Note: Jerry Levy MD  Attending pager:[phone]  office [phone]  I  have seen and examined this patient, reviewed their chart. I have discussed this patient with the resident. I agree with the resident's findings, assessment and care plan.

## 2021-08-27 NOTE — Progress Notes (Signed)
Family Medicine Teaching Service Daily Progress Note Intern Pager: [phone]  Patient name: Jerry Humphrey Medical record number: [id number] Date of birth: July 17, 1961 Age: 61 y.o. Gender: male  Primary Care Provider: Pcp, No Consultants: Neuro s/o Code Status: Full  Pt Overview and Major Events to Date:  1/22 Admitted 2/1 stable for SNF placement  Assessment and Plan:  Jerry Humphrey is a 61 year old male who presented with left-sided weakness found to have a hemorrhagic CVA.  His past medical history significant for alcohol withdrawal seizures, hypertension, alcohol use disorder, tobacco use disorder.    Suicidal Ideations This morning patient mentioned that he wanted to "shoot himself in the head" or walk into oncoming traffic because of being inside for so long.  I asked him if he would act on these thoughts and he said that the only thing stopping is being in the hospital and not being able to walk on his own well.  I asked if he would feel better at the SNF/if the suicidal thoughts would go away and he said he was not sure.  He said he has been having these since being in the hospital and not before. -Suicide monitoring  Hemorrhagic CVA Likely secondary to poorly controlled hypertension and alcohol use disorder.  PT/OT working with patient recommended discharge to SNF.  Discharge has been delayed due to LOG form needed prior to admission.  Neurology has signed off and currently says he has some tingling on the left arm and left leg but otherwise okay. -Outpatient repeat MRI and CTA head and neck once hematoma resolves -Rosuvastatin 20 mg daily -Not on antiplatelet therapy currently  Hyponatremia, chronic and stable Last NA of 134 on 1/31. -Weekly BMP due 2/7  HTN Blood pressure ranges have been 115-139/74 -amlodipine 10 mg daily -HCTZ 12.5 mg daily  Alcohol use disorder   tobacco use disorder -Encourage cessation -Thiamine, folic acid, MVI  FEN/GI: Regular PPx:  SCD Dispo: SNF, barriers include bed availability and suicidal ideation Subjective:  Patient did discuss suicidal ideations with me this morning  Objective: Temp:  [97.5 F (36.4 C)-98.8 F (37.1 C)] 97.9 F (36.6 C) (02/06 0330) Pulse Rate:  [70-79] 72 (02/06 0330) Resp:  [15-19] 15 (02/06 0330) BP: (115-139)/(74-104) 126/85 (02/06 0330) SpO2:  [97 %-100 %] 99 % (02/06 0330) Physical Exam: General: NAD, laying in bed comfortably Psych: active SI  Cardiovascular: RRR no murmurs rubs or gallops Respiratory: Clear to auscultation bilaterally no wheezes rales or crackles Abdomen: Nondistended nontender to palpation soft Extremities: No lower extremity edema, moving both  Laboratory: No results for input(s): WBC, HGB, HCT, PLT in the last 168 hours. Recent Labs  Lab 08/22/21 0153  NA 134*  K 5.1  CL 97*  CO2 28  BUN 18  CREATININE 1.08  CALCIUM 9.3  GLUCOSE 115*      Imaging/Diagnostic Tests: No results found.   Levin Erp, MD 08/28/2021, 7:02 AM PGY-1, [facility] Family [facility] FPTS Intern pager: [phone], text pages welcome

## 2021-08-27 NOTE — TOC Progression Note (Signed)
Transition of Care [facility]) - Initial/Assessment Note    Patient Details  Name: Jerry Humphrey MRN: [id number] Date of Birth: 14-May-1961  Transition of Care [facility]) CM/SW Contact:    Milinda Antis, LCSWA Phone Number: 08/27/2021, 1:15 PM  Clinical Narrative:                 CSW contacted [facility] leadership to inquire about the patient's ability to dc to facility today.  Leadership has messaged the facility and has not received a response.  Expected Discharge Plan: Skilled Nursing Facility Barriers to Discharge: SNF Pending bed offer, SNF Pending payor source - LOG, SNF Pending Medicaid   Patient Goals and CMS Choice Patient states their goals for this hospitalization and ongoing recovery are:: to get rehab CMS Medicare.gov Compare Post Acute Care list provided to:: Patient Choice offered to / list presented to : Patient  Expected Discharge Plan and Services Expected Discharge Plan: [place] Choice: [place] Living arrangements for the past 2 months: Mobile Home Expected Discharge Date: 08/26/21                                    Prior Living Arrangements/Services Living arrangements for the past 2 months: Mobile Home Lives with:: Self Patient language and need for interpreter reviewed:: No Do you feel safe going back to the place where you live?: Yes      Need for Family Participation in Patient Care: No (Comment) Care giver support system in place?: No (comment)   Criminal Activity/Legal Involvement Pertinent to Current Situation/Hospitalization: No - Comment as needed  Activities of Daily Living Home Assistive Devices/Equipment: Cane (specify quad or straight) ADL Screening (condition at time of admission) Patient's cognitive ability adequate to safely complete daily activities?: Yes Is the patient deaf or have difficulty hearing?: No Does the patient have difficulty seeing, even when wearing glasses/contacts?:  No Does the patient have difficulty concentrating, remembering, or making decisions?: No Patient able to express need for assistance with ADLs?: Yes Does the patient have difficulty dressing or bathing?: Yes Independently performs ADLs?: No Communication: Independent Dressing (OT): Needs assistance Is this a change from baseline?: Change from baseline, expected to last >3 days Grooming: Needs assistance Is this a change from baseline?: Change from baseline, expected to last >3 days Feeding: Independent Bathing: Needs assistance Is this a change from baseline?: Change from baseline, expected to last >3 days Toileting: Needs assistance Is this a change from baseline?: Change from baseline, expected to last >3days In/Out Bed: Needs assistance Is this a change from baseline?: Change from baseline, expected to last >3 days Walks in Home: Needs assistance Is this a change from baseline?: Change from baseline, expected to last >3 days Does the patient have difficulty walking or climbing stairs?: Yes Weakness of Legs: Both Weakness of Arms/Hands: Left  Permission Sought/Granted Permission sought to share information with : Facility Sport and exercise psychologist, Family Supports Permission granted to share information with : Yes, Verbal Permission Granted  Share Information with NAME: [name]  Permission granted to share info w AGENCY: SNF  Permission granted to share info w Relationship: Brother     Emotional Assessment Appearance:: Appears stated age Attitude/Demeanor/Rapport: Engaged Affect (typically observed): Appropriate Orientation: : Oriented to Self, Oriented to Place, Oriented to  Time, Oriented to Situation Alcohol / Substance Use: Alcohol Use Psych Involvement: No (comment)  Admission diagnosis:  Subcortical hemorrhage ([place]) [I61.0] Right-sided nontraumatic intracerebral hemorrhage, unspecified cerebral location [facility]) [I61.9] Patient Active Problem List   Diagnosis Date Noted    Knee pain    Right-sided nontraumatic intracerebral hemorrhage ([place])    Hypertension    Alcohol use    Subcortical hemorrhage ([place]) 08/13/2021   Closed fracture of right distal tibia    Tibia/fibula fracture 02/01/2019   Alcohol abuse 01/30/2019   Chest pain 01/30/2019   Leukocytosis 01/30/2019   Tobacco use disorder 01/30/2019   Closed fracture of right tibia and fibula 01/30/2019   Fall due to stumbling 01/30/2019   Seizure disorder ([place]) 01/30/2019   PCP:  Pcp, No Pharmacy:   [facility] [address] [place] [address] Phone: [phone] Fax: [phone]  [id number] [place], [place] [id number] [place] [id number] Phone: [phone] Fax: [phone]     Social Determinants of Health (SDOH) Interventions    Readmission Risk Interventions No flowsheet data found.

## 2021-08-28 LAB — TROPONIN I (HIGH SENSITIVITY): Troponin I (High Sensitivity): 5 ng/L (ref <18)

## 2021-08-28 MED ORDER — SIMETHICONE 80 MG PO CHEW
80.0000 mg | CHEWABLE_TABLET | Freq: Four times a day (QID) | ORAL | Status: DC | PRN
  Filled 2021-08-28: qty 1

## 2021-08-28 MED ORDER — FLUOXETINE HCL 10 MG PO CAPS
10.0000 mg | ORAL_CAPSULE | Freq: Every day | ORAL | Status: DC
  Administered 2021-08-28 – 2021-08-31 (×4): 10 mg via ORAL
  Filled 2021-08-28 (×4): qty 1

## 2021-08-28 NOTE — Progress Notes (Addendum)
FPTS Interim Progress Note  S: Paged by nursing that patient had episode of chest pain for around 4 minutes.  Vitals remained stable during the time but was in the middle of his chest.  Went to see patient and chest pain had resolved.  Denied any pleuritic component or tenderness to palpation.  Denied any radiation of pain to his shoulders or down his back.  Did say his stomach hurt as well during the episode and "feels like it was his gas".  Said that the chest pain had started when he started "thinking about lots of different things." Felt better after drinking his juice. Looked at the EKG in the room and there were no acute ST elevations/changes.   Also said that he is very interested in antidepressants and "wanting to make his mind feel better."  Says he has never tried antidepressants before.  Informed him that psychiatry will also be seeing him tomorrow.  O: BP 122/71 (BP Location: Left Arm)    Pulse 83    Temp 98 F (36.7 C) (Oral)    Resp 20    Ht 5\' 8"  (1.727 m)    Wt 79.9 kg    SpO2 98%    BMI 26.78 kg/m   Gen: Comfortable appearing, alert and responsive to questions Resp: no iWOB, CTAB CV: RRR no m/r/g  A/P: Less likely to be cardiac in etiology given patient's pain is resolved and EKG was normal. Could be secondary to gas pain or anxiety. Will order troponins given patient high risk for MI given he was admitted for stroke. -Simethicone 80 mg 4 times daily as needed -Tylenol 650 mg every 6 hours as needed -Troponins ordered -Fluoxetine 10 mg -Monitor clinically  , MD 08/28/2021, 6:15 PM PGY-1, [facility] pager [phone]

## 2021-08-28 NOTE — Progress Notes (Signed)
Physical Therapy Treatment Patient Details Name: Jerry Humphrey MRN: [id number] DOB: 1960/10/25 Today's Date: 08/28/2021   History of Present Illness Pt is a 61 y/o male presenting on 1/22 with L sided weakness, facial droop and dysarthria.  CT head with R subcortical hemorrhage and midline shift. PMH includes: ETOH abuse, HTN, seizures.    PT Comments    Pt was very eager to participate today. Patient requiring min to mod assist x2 for safety today during ambulation. Pt continues to present with L lateral lean during gait that increases with fatigue even with cues. Blood pressure during ambulation was 146/104 mmHg. RN notified. Patient required one seated break during ambulation. Needed frequent stops to normalize midline positioning. Patient engaged LUE in transfers throughout session with VC's. PT to continue to recommend SNF post-acutely. PT will continue to follow acutely to maximize functional mobility, safety, and highest level of independence.  Recommendations for follow up therapy are one component of a multi-disciplinary discharge planning process, led by the attending physician.  Recommendations may be updated based on patient status, additional functional criteria and insurance authorization.  Follow Up Recommendations  Skilled nursing-short term rehab (<3 hours/day)     Assistance Recommended at Discharge Frequent or constant Supervision/Assistance  Patient can return home with the following Two people to help with walking and/or transfers;A lot of help with bathing/dressing/bathroom;Assistance with cooking/housework;Direct supervision/assist for medications management;Assist for transportation;Help with stairs or ramp for entrance   Equipment Recommendations  Wheelchair cushion (measurements PT);Wheelchair (measurements PT)    Recommendations for Other Services       Precautions / Restrictions Precautions Precautions: Fall Precaution Comments: impulsivity, L  inattention Restrictions Weight Bearing Restrictions: No     Mobility  Bed Mobility Overal bed mobility: Needs Assistance Bed Mobility: Supine to Sit     Supine to sit: Min guard     General bed mobility comments: min guard for safety    Transfers Overall transfer level: Needs assistance Equipment used: Rolling walker (2 wheels) Transfers: Sit to/from Stand Sit to Stand: Min assist           General transfer comment: assist and VC's to correct moderate L lean toward midline    Ambulation/Gait Ambulation/Gait assistance: +2 safety/equipment, +2 physical assistance, Mod assist, Min assist Gait Distance (Feet): 75 Feet (needed seated rest at 41ft due to fatigue) Assistive device: Rolling walker (2 wheels) Gait Pattern/deviations: Step-to pattern, Decreased step length - right, Decreased step length - left, Decreased stride length, Decreased weight shift to right, Staggering left, Drifts right/left Gait velocity: decr Gait velocity interpretation: <1.31 ft/sec, indicative of household ambulator Pre-gait activities: standing in front of mirror at sink with UE positioning on walker pt was able to correct L lateral lean and sustain midline positioning while looking in mirror. General Gait Details: Pt with heavy L lateral lean, min upt o mod assist to help w/shift R as well as VC's and tactile cues to remain in a more midline positioning with gait. As pt fatigued patient required increased assist and required seated rest break. Required cues for safe walker usage. (tactile cues to push against PT w/ pt's right shoulder were ineffective in correcting L lean during ambulation.)   Stairs             Wheelchair Mobility    Modified Rankin (Stroke Patients Only)       Balance Overall balance assessment: Needs assistance Sitting-balance support: Feet supported, Single extremity supported Sitting balance-Leahy Scale: Good Sitting balance - Comments: able to  maintain  static sitting Postural control: Left lateral lean Standing balance support: Bilateral upper extremity supported Standing balance-Leahy Scale: Poor Standing balance comment: reliant on UE and external support                            Cognition Arousal/Alertness: Awake/alert Behavior During Therapy: WFL for tasks assessed/performed, Impulsive Overall Cognitive Status: No family/caregiver present to determine baseline cognitive functioning Area of Impairment: Following commands, Attention, Safety/judgement, Awareness                   Current Attention Level: Selective   Following Commands: Follows one step commands with increased time, Follows one step commands inconsistently Safety/Judgement: Decreased awareness of deficits, Decreased awareness of safety Awareness: Emergent Problem Solving: Slow processing General Comments: provided pt with maximal multimodal cues however pt had difficulty following        Exercises      General Comments        Pertinent Vitals/Pain Pain Assessment Pain Assessment: Faces Faces Pain Scale: Hurts little more Pain Location: bilateral toes Pain Descriptors / Indicators: Sore, Grimacing Pain Intervention(s): Limited activity within patient's tolerance, Monitored during session    Home Living                          Prior Function            PT Goals (current goals can now be found in the care plan section) Acute Rehab PT Goals Patient Stated Goal: to go home PT Goal Formulation: With patient Time For Goal Achievement: 09/11/21 Potential to Achieve Goals: Good Progress towards PT goals: Progressing toward goals    Frequency    Min 4X/week      PT Plan Current plan remains appropriate    Co-evaluation              AM-PAC PT "6 Clicks" Mobility   Outcome Measure  Help needed turning from your back to your side while in a flat bed without using bedrails?: A Little Help needed moving  from lying on your back to sitting on the side of a flat bed without using bedrails?: A Little Help needed moving to and from a bed to a chair (including a wheelchair)?: A Lot Help needed standing up from a chair using your arms (e.g., wheelchair or bedside chair)?: A Lot Help needed to walk in hospital room?: Total Help needed climbing 3-5 steps with a railing? : Total 6 Click Score: 12    End of Session Equipment Utilized During Treatment: Gait belt Activity Tolerance: Patient tolerated treatment well Patient left: in chair;with nursing/sitter in room (sitting at sink with NT for bath) Nurse Communication: Mobility status PT Visit Diagnosis: Unsteadiness on feet (R26.81);Other abnormalities of gait and mobility (R26.89);Muscle weakness (generalized) (M62.81)     Time: 2706-2376 PT Time Calculation (min) (ACUTE ONLY): 15 min  Charges:  $Gait Training: 8-22 mins                     Enis Slipper, SPT   [place] 08/28/2021, 1:05 PM

## 2021-08-28 NOTE — Progress Notes (Signed)
FPTS Brief Note Reviewed patient's vitals, recent notes.  Vitals:   08/27/21 1932 08/27/21 2339  BP: (!) 139/91 115/74  Pulse: 70 71  Resp: 17 16  Temp: 98.8 F (37.1 C) (!) 97.5 F (36.4 C)  SpO2: 99% 97%   At this time, no change in plan from day progress note. Stable for discharge when bed available.  Lyndee Hensen, DO Page [phone] with questions about this patient.

## 2021-08-28 NOTE — TOC Progression Note (Signed)
Transition of Care [facility]) - Progression Note    Patient Details  Name: Jerry Humphrey MRN: [id number] Date of Birth: 08/24/1960  Transition of Care [facility]) CM/SW Contact  Baldemar Lenis, [place] Phone Number: 08/28/2021, 4:02 PM  Clinical Narrative:   CSW noting per chart review and update from RN this morning that patient now expressing suicidal ideation. CSW asked MD for psych consult to clear, patient cannot discharge to SNF with active suicidal ideation and sitter. CSW to follow.    Expected Discharge Plan: Skilled Nursing Facility Barriers to Discharge: SNF Pending bed offer, SNF Pending payor source - LOG, SNF Pending Medicaid  Expected Discharge Plan and Services Expected Discharge Plan: Skilled Nursing Facility     Post Acute Care Choice: Skilled Nursing Facility Living arrangements for the past 2 months: Mobile Home Expected Discharge Date: 08/26/21                                     Social Determinants of Health (SDOH) Interventions    Readmission Risk Interventions No flowsheet data found.

## 2021-08-28 NOTE — Progress Notes (Addendum)
This nurse spoke with Dr. Laroy Apple re: pt and his admission to wanting to harm himself. Spoke with Network engineer and was calling the [facility] to see if we had any staff to sit with pt. This nurse has remained in the room while awaiting staff to take my place. This nurse cleaned the room off all lines/ chords, removed trash cans, latex gloves, secured drawers, doors and plugs, will amend diet order to state suicide precautions, so only disposable trays are sent. Will continue to monitor.     This nurse also packed up patients belongings, placed a name sticker on them and placed in storage until d/c.

## 2021-08-28 NOTE — Progress Notes (Signed)
Family Medicine Teaching Service Daily Progress Note Intern Pager: [phone]  Patient name: Jerry Humphrey Medical record number: [id number] Date of birth: 1961-03-21 Age: 61 y.o. Gender: male  Primary Care Provider: Pcp, No Consultants: Neuro s/o, Psychiatry Code Status: Full  Pt Overview and Major Events to Date:  1/22 admitted 2/1 stable 2/6 SI  Assessment and Plan:  Jerry Humphrey is a 61 yo male who presented with left sided weakness found to have a hemmorhaghic CVA. PMH significant for alcohol withdrawal seizures, htn, alcohol use disorder, tobacco use disorder. Medically stable  Suicidal Ideations This morning patient denies any SI. Started him on prozac yesterday and patient and has no side effects currently -prozac 10 mg. -Psychiatry consulted, appreciate assistance -Suicide monitoring with sitter  Chest Pain, resolved Had an episode yesterday of 4 minutes of chest pain that resolved. Troponins were 5. EKG without acute ST changes. Denies any additional episodes of chest pain -monitor -simethicone qid prn -manage mood symptoms  Hemorrhagic CVA Likely 2/2 to poorly controlle htn and alcohol use disorder. PT/OT rec SNF. Neurology has signed off and not acute neurologic issues. -outpt repeat MRI and CTA head and neck once hematoma resolved -rosuvastatin 20 mg daily -not on antiplatelet currently  Hypokalemia 3.4 -repleted  Hyponatremia, chronic/stable Na today 135 from 134 last week. -weekly bmp  HTN Blood pressure ranges have been mostly normotensive -amlodipine 10 mg daily  -hctz 12.5 mg daily  Alcohol Use Disorder Tobacco Use Disorder -Encourage cessation -thiamine, folic acid, MVI  FEN/GI: Regular PPx: SCDs Dispo:SNF barriers include bed availability + psych clearance  Subjective:  No SI this morning, feels fine. Denies any chest pain  Objective: Temp:  [97.5 F (36.4 C)-98.6 F (37 C)] 98.6 F (37 C) (02/07 0826) Pulse Rate:  [71-90] 76  (02/07 0826) Resp:  [18-21] 20 (02/07 0826) BP: (117-135)/(71-92) 126/88 (02/07 0826) SpO2:  [95 %-99 %] 96 % (02/07 0826) Physical Exam: General: NAD, laying in bed comfortably Cardiovascular: RRR no m/r/g Respiratory: CTAB no w/r/c Abdomen: Soft, nontender to palpation Extremities: No LE edema  Laboratory: No results for input(s): WBC, HGB, HCT, PLT in the last 168 hours. Recent Labs  Lab 08/29/21 0554  NA 135  K 3.4*  CL 95*  CO2 29  BUN 17  CREATININE 0.96  CALCIUM 9.5  GLUCOSE 110*      Imaging/Diagnostic Tests: No results found.   Gerrit Heck, MD 08/29/2021, 12:36 PM PGY-1, [facility] Intern pager: [phone], text pages welcome

## 2021-08-29 LAB — BASIC METABOLIC PANEL
Anion gap: 11 (ref 5–15)
BUN: 17 mg/dL (ref 8–23)
CO2: 29 mmol/L (ref 22–32)
Calcium: 9.5 mg/dL (ref 8.9–10.3)
Chloride: 95 mmol/L — ABNORMAL LOW (ref 98–111)
Creatinine, Ser: 0.96 mg/dL (ref 0.61–1.24)
GFR, Estimated: >60 mL/min (ref >60)
Glucose, Bld: 110 mg/dL — ABNORMAL HIGH (ref 70–99)
Potassium: 3.4 mmol/L — ABNORMAL LOW (ref 3.5–5.1)
Sodium: 135 mmol/L (ref 135–145)

## 2021-08-29 MED ORDER — POTASSIUM CHLORIDE 20 MEQ PO PACK
40.0000 meq | PACK | Freq: Once | ORAL | Status: AC
  Administered 2021-08-29: 40 meq via ORAL
  Filled 2021-08-29: qty 2

## 2021-08-29 NOTE — Progress Notes (Signed)
Physical Therapy Treatment Patient Details Name: Jerry Humphrey MRN: [id number] DOB: 1961/04/05 Today's Date: 08/29/2021   History of Present Illness Pt is a 61 y/o male presenting on 1/22 with L sided weakness, facial droop and dysarthria.  CT head with R subcortical hemorrhage and midline shift. PMH includes: ETOH abuse, HTN, seizures.    PT Comments    Pt motivated to participate in session, expresses desire to work on balance and gait training. Pt continues present with heavy L lateral bias in sitting and standing activity, benefits from verbal, tactile, and visual cues through use of full-length mirror to correct. Pt ambulatory for multiple short hallway distances today, requiring min-mod physical and cuing assist to correct L bias and prevent falls x2. Pt continues to lack insight into deficits, but L inattention improving. PT to continue to follow.    Recommendations for follow up therapy are one component of a multi-disciplinary discharge planning process, led by the attending physician.  Recommendations may be updated based on patient status, additional functional criteria and insurance authorization.  Follow Up Recommendations  Skilled nursing-short term rehab (<3 hours/day)     Assistance Recommended at Discharge Frequent or constant Supervision/Assistance  Patient can return home with the following A lot of help with bathing/dressing/bathroom;Assistance with cooking/housework;Direct supervision/assist for medications management;Assist for transportation;Help with stairs or ramp for entrance;A lot of help with walking and/or transfers   Equipment Recommendations  Wheelchair cushion (measurements PT);Wheelchair (measurements PT)    Recommendations for Other Services       Precautions / Restrictions Precautions Precautions: Fall Precaution Comments: impulsivity, L inattention (improving) Restrictions Weight Bearing Restrictions: No     Mobility  Bed Mobility Overal bed  mobility: Needs Assistance Bed Mobility: Supine to Sit, Sit to Supine     Supine to sit: Supervision, HOB elevated Sit to supine: Supervision, HOB elevated   General bed mobility comments: safety, HOB elevated for exit from and entry into bed. Bed exit towards L    Transfers Overall transfer level: Needs assistance Equipment used: None Transfers: Sit to/from Stand Sit to Stand: Min assist           General transfer comment: assist for correcting heavy L lateral lean, SPT positioned on pt's L side to prevent heavy L translation. Mirror placed in front of pt for sit<>stands x5 at start of session to provide external cue for righting balance. STS x5 as intervention, x2 in hallway chair in between bouts of gait    Ambulation/Gait Ambulation/Gait assistance: Min assist, Mod assist, +2 safety/equipment Gait Distance (Feet): 75 Feet (x3) Assistive device: None, 1 person hand held assist Gait Pattern/deviations: Decreased stride length, Decreased weight shift to right, Staggering left, Drifts right/left, Step-through pattern Gait velocity: decr     General Gait Details: min assist to steady with periods of mod to correct LOB towards L x2. Midline positioning encouraged with use of tactile facilitation though L trunk and R pelvic girdle, use of mirror as external cue for first 35-40 ft, followed by 35-40 ft without. Seated rest breaks to recover fatigue, worsening L lateral leaning with fatigue. Verbal cuing for upright posture, avoiding drift L by using external cues of tiles on the floor.   Stairs             Wheelchair Mobility    Modified Rankin (Stroke Patients Only) Modified Rankin (Stroke Patients Only) Pre-Morbid Rankin Score: No symptoms Modified Rankin: Moderately severe disability     Balance Overall balance assessment: Needs assistance Sitting-balance support: Feet  supported, Single extremity supported Sitting balance-Leahy Scale: Good   Postural control:  Left lateral lean Standing balance support: No upper extremity supported Standing balance-Leahy Scale: Poor Standing balance comment: reliant on PT/SPT correction, heavy L bias worse with fatigue                            Cognition Arousal/Alertness: Awake/alert Behavior During Therapy: WFL for tasks assessed/performed, Impulsive Overall Cognitive Status: No family/caregiver present to determine baseline cognitive functioning Area of Impairment: Following commands, Attention, Safety/judgement, Awareness                   Current Attention Level: Selective   Following Commands: Follows one step commands with increased time, Follows one step commands inconsistently Safety/Judgement: Decreased awareness of deficits, Decreased awareness of safety Awareness: Emergent Problem Solving: Slow processing, Requires verbal cues, Requires tactile cues General Comments: pt lacks insight into deficits, at one point during gait training pt states "I'd do better on my own" even after LOB x2 requiring significant PT intervention. Max cues for safety and attending to upright balance/L side during mobility        Exercises      General Comments        Pertinent Vitals/Pain Pain Assessment Pain Assessment: Faces Faces Pain Scale: Hurts a little bit Pain Location: L toes Pain Descriptors / Indicators: Tingling Pain Intervention(s): Limited activity within patient's tolerance, Monitored during session, Repositioned    Home Living                          Prior Function            PT Goals (current goals can now be found in the care plan section) Acute Rehab PT Goals Patient Stated Goal: to go home PT Goal Formulation: With patient Time For Goal Achievement: 09/11/21 Potential to Achieve Goals: Good Progress towards PT goals: Progressing toward goals    Frequency    Min 4X/week      PT Plan Current plan remains appropriate    Co-evaluation               AM-PAC PT "6 Clicks" Mobility   Outcome Measure  Help needed turning from your back to your side while in a flat bed without using bedrails?: A Little Help needed moving from lying on your back to sitting on the side of a flat bed without using bedrails?: A Little Help needed moving to and from a bed to a chair (including a wheelchair)?: A Lot Help needed standing up from a chair using your arms (e.g., wheelchair or bedside chair)?: A Little Help needed to walk in hospital room?: A Lot Help needed climbing 3-5 steps with a railing? : Total 6 Click Score: 14    End of Session Equipment Utilized During Treatment: Gait belt Activity Tolerance: Patient tolerated treatment well Patient left: with nursing/sitter in room;in bed;with call bell/phone within reach (sitter present, declines need for turning on bed alarm) Nurse Communication: Mobility status PT Visit Diagnosis: Unsteadiness on feet (R26.81);Other abnormalities of gait and mobility (R26.89);Muscle weakness (generalized) (M62.81)     Time: 6294-7654 PT Time Calculation (min) (ACUTE ONLY): 23 min  Charges:  $Gait Training: 8-22 mins $Neuromuscular Re-education: 8-22 mins                     Drue Harr S, PT DPT Acute Rehabilitation Services Pager [phone]  Office  [phone]    Diago Haik E [place] 08/29/2021, 1:07 PM

## 2021-08-29 NOTE — Plan of Care (Signed)
Pt is alert oriented x 4. Pt has left sided weakness. Denies pain. Denies desires to hurt self or plan do to so. Pt states he is feeling better today.  Pt has 1:1 sitter for suicide precautions.    Problem: Education: Goal: Knowledge of General Education information will improve Description: Including pain rating scale, medication(s)/side effects and non-pharmacologic comfort measures Outcome: Progressing   Problem: Health Behavior/Discharge Planning: Goal: Ability to manage health-related needs will improve Outcome: Progressing   Problem: Clinical Measurements: Goal: Ability to maintain clinical measurements within normal limits will improve Outcome: Progressing Goal: Will remain free from infection Outcome: Progressing Goal: Diagnostic test results will improve Outcome: Progressing Goal: Respiratory complications will improve Outcome: Progressing Goal: Cardiovascular complication will be avoided Outcome: Progressing   Problem: Activity: Goal: Risk for activity intolerance will decrease Outcome: Progressing   Problem: Nutrition: Goal: Adequate nutrition will be maintained Outcome: Progressing   Problem: Coping: Goal: Level of anxiety will decrease Outcome: Progressing   Problem: Elimination: Goal: Will not experience complications related to bowel motility Outcome: Progressing Goal: Will not experience complications related to urinary retention Outcome: Progressing   Problem: Pain Managment: Goal: General experience of comfort will improve Outcome: Progressing   Problem: Safety: Goal: Ability to remain free from injury will improve Outcome: Progressing   Problem: Skin Integrity: Goal: Risk for impaired skin integrity will decrease Outcome: Progressing

## 2021-08-29 NOTE — Progress Notes (Signed)
Family Medicine Teaching Service Daily Progress Note Intern Pager: [phone]  Patient name: Jerry Humphrey Medical record number: [id number] Date of birth: Apr 05, 1961 Age: 61 y.o. 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 PMH significant for alcohol withdrawal seizures, htn, alcohol use disorder, tobacco use disorder. Medically stable for SNF placement  Suicidal Ideations, resolved- Cleared by Psychiatry This morning patient denies any SI. Psychiatry cleared patient for SNF yesterday and contracted safety -prozac 10 mg daily -d/c'd sitter -monitor  Hemmorhagic CVA Likely 2/2 to poorly controlled HTN and alcohol use disorder. PT/OT rec SNF. Neuro signed off. Did have some tingling in his feet. -consider B12/Folate if worsening -Outpatient repeat MRI and CTA head/neck for hematoma resolution -rosuvastatin 20 mg daily -not on antiplatelet currently  Hyponatremia chronic/stable Na stable yesterday at 135. -Weekly BMPs  HTN BP ranges 100-143/79-91 -amlodipine 10 mg daily -hctz 12.5 mg daily  Chronic/stable Alcohol use disorder-thiamine, folic acid, MVI Chest pain, resolved  FEN/GI: Regular PPx: SCDs Dispo: Medically stable SNF barriers include bed availability  Subjective:  Did not have any complaints today except for some tingling in his feet after walking with PT yesterday that is still there today.  Objective: Temp:  [97.8 F (36.6 C)-98.5 F (36.9 C)] 98.5 F (36.9 C) (02/08 0824) Pulse Rate:  [64-81] 64 (02/08 0824) Resp:  [16-20] 16 (02/08 0824) BP: (100-143)/(79-91) 100/79 (02/08 0824) SpO2:  [95 %-98 %] 97 % (02/08 0824) Physical Exam: General: NAD, laying in  bed comfortably Cardiovascular: RRR no murmurs rubs or gallops Respiratory: Clear to auscultation bilaterally no wheezes rales or crackles Abdomen: Nontender to palpation, soft, bowel sounds normoactive Extremities: No lower extremity edema, feet without ulceration, toenails with onychomycosis no joint swelling  Laboratory: No results for input(s): WBC, HGB, HCT, PLT in the last 168 hours. Recent Labs  Lab 08/29/21 0554  NA 135  K 3.4*  CL 95*  CO2 29  BUN 17  CREATININE 0.96  CALCIUM 9.5  GLUCOSE 110*      Imaging/Diagnostic Tests: No results found.   Levin Erp, MD 08/30/2021, 9:33 AM PGY-1, [facility] Family Medicine FPTS Intern pager: [phone], text pages welcome

## 2021-08-29 NOTE — Progress Notes (Signed)
FPTS Brief Progress Note  S: Pt sleeping    O: BP 119/72 (BP Location: Right Arm)    Pulse 71    Temp 98.2 F (36.8 C) (Oral)    Resp 19    Ht 5\' 8"  (1.727 m)    Wt 79.9 kg    SpO2 99%    BMI 26.78 kg/m    GEN: sleeping peacefully  RESP: equal chest rise and fall   A/P: VSS. No change to plan. See AM progress note  - Orders reviewed. Labs for AM ordered, which was adjusted as needed.    , DO 08/29/2021, 2:58 AM PGY-3, [facility] Night Resident  Please page [phone] with questions.

## 2021-08-29 NOTE — Progress Notes (Signed)
Speech Language Pathology Treatment: Cognitive-Linquistic  Patient Details Name: Jerry Humphrey MRN: [id number] DOB: [birth year] Today's Date: 08/29/2021 Time: 4967-5916 SLP Time Calculation (min) (ACUTE ONLY): 19 min  Assessment / Plan / Recommendation Clinical Impression  Pt was seen for cognitive and communicative treatment. SLP provided written passage about topic of pt's choice. He had good attention from L to R across the page, but needed cues to find what was written at the top of the page. He sustained attention well while reading this short passage, with Min cues also provided to practice speech intelligibility strategies. He slows his rate well, attributing this to speech therapy that he did when he was a child too (question what his pronunciation was like PTA) but does still have imprecise articulation. Will continue to follow.    HPI HPI: 61 yo with PMHx of ETOH and Tobacco abuse, HTN, Alcohol withdrawal Seizures presented with left-sided weakness. He endorses a slight Headache this morning with a reduced sensation in his left extremities. MRI Biconvex intra-axial hemorrhage in the right basal ganglia with estimated blood volume of 29 mL. Surrounding edema and regional mass effect including mild leftward midline shift of 4 mm.      SLP Plan  Continue with current plan of care      Recommendations for follow up therapy are one component of a multi-disciplinary discharge planning process, led by the attending physician.  Recommendations may be updated based on patient status, additional functional criteria and insurance authorization.    Recommendations                   Follow Up Recommendations: Skilled nursing-short term rehab (<3 hours/day) Assistance recommended at discharge: Set up Supervision/Assistance SLP Visit Diagnosis: Dysarthria and anarthria (R47.1);Cognitive communication deficit (R41.841) Plan: Continue with current plan of care           Mahala Menghini.,  M.A. CCC-SLP Acute Rehabilitation Services Pager [phone] Office [phone]  08/29/2021, 11:43 AM

## 2021-08-29 NOTE — Consult Note (Signed)
[facility] Face-to-Face Psychiatry Consult   Reason for Consult:  SI, needs clearance for SNF.  Referring Physician:  Dr Jinny Sanders Patient Identification: Jerry Humphrey MRN:  [id number] Principal Diagnosis: Subcortical hemorrhage ([place]) Diagnosis:  Principal Problem:   Subcortical hemorrhage ([place]) Active Problems:   Tobacco use disorder   Hypertension   Alcohol use   Knee pain   Right-sided nontraumatic intracerebral hemorrhage (HCC)   Total Time spent with patient: 45 minutes  HPI  Assessment: Jerry Humphrey is a 61 y.o. male patient with past medical h/o HTN, Alcohol use, tobacco abuse, Alcohol withdrawal seizures admitted medically with left sided weakness found to have hemorrhagic CVA. Psych consulted for SI and clearance for SNF.  Patient is frustrated as patient has been here in the hospital for 15 days and not able to walk without help and use her left hand.  Patient made suicidal statements yesterday due to this frustration.  He felt much better after working with PT yesterday.  Patient denies any active or passive suicidal ideation today.  He denies any plan or intent.  He contracts for safety at this time.  Patient has good support system, has a home and living with brother, has 2 daughters who live in [place].  Patient does have hunting guns in his house which are not locked.  Called brother to ask him if he can put away the guns.  Safety planning done with brother.  Brother does not have any safety concerns at this time.  Patient does not meet criteria for inpatient psych hospitalization.  He is psych cleared for SNF.  Recommendations Safety Patient is low risk for self-harm. -Can continue safety sitter as patient is high fall risk.  Medications Continue Prozac 10 mg daily for depression.  Started by primary recently. Continue ramelteon 8 mg nightly for insomnia. Continue melatonin 5 mg nightly for insomnia  Disposition Patient does not meet criteria for inpatient  hospitalization.  Denies SI, plan or intent.  Contracts for safety. Safety planning done with brother.  Brother does not have any safety concerns.  Asked him to put the guns away.  Patient is psych cleared for SNF.  Thank you for psych consult.  Psych will sign off.   Subjective : Pt is seen and examined today. Pt state he made suicidal statements yesterday as he was frustrated about being in the hospital for many days. He states he didn't actually mean about hurting himself.  He states he cannot walk without help and cannot use his left hand fully.  He states sometimes when he sees outside he feels that he should be out there, walking and doing other activities but instead he is stuck in the hospital and cannot go out. He states PT worked with him yesterday and now he feels much better. He denies any active or passive suicidal thoughts, homicidal ideations and auditory and visual hallucinations. He contracts to safety at this time.  Denies plan or intent.  He denies any paranoia. He states he was homeless before but has been living with his brother for last 3 years. Pt's other brother died due to overdose in 11/24/2022 of last year. Pt has 2 daughters who live in [place].  He states he visits them whenever he can.  Patient states he has few deer hunting guns in his home which are not locked.  He states his brother keep them in a cabin.  Discussed that we need to talk to his brother to see if all guns  can be put in the locker.  Patient agrees with the plan and gives consent to talk to brother.  Patient denies depressed mood, problems with appetite, memory, concentration.  He states he feels hopeless sometimes and does not sleep well mainly due to being in the hospital. Pt denies any past Suicidal attempts. Pt has never tried any psychotropic meds in the past.  Patient denies any psychiatric diagnoses.  She states that he thinks he has bipolar because his mind races sometimes and he often gets mad but denies  any other manic symptoms or episodes.  Patient states he does not have a driving license because in his 20's, he used to drink and drive and got 2 DUIs.  He denies drinking alcohol and denies use of illicit drugs.  He endorses smoking cigarettes half a pack a day.  Encouraged to work with PT to get strength back to walk and use his left hand again.  He agrees with the plan. Patient is alert and oriented x4.  His mood is good and affect is appropriate.  Denies SI, HI, AVH.  Denies plan or intent.  Contracts to safety at this time.  Called brother Jerry Humphrey at [phone]- for collateral and safety planning.  Brother states patient has never attempted suicide in the past but talks junk sometimes specially when he drinks alcohol.  He states he sometimes says things just to get attention.  Brother does not have any safety concerns.  Asked him to put away all the guns from home.  He verbalizes understanding.  He states the social worker called him to let him know that patient is going to SNF.  Past Psychiatric History: Alcohol use, tobacco abuse  Risk to Self:  No Risk to Others:  No Prior Inpatient Therapy:  No Prior Outpatient Therapy:  No  Past Medical History:  Past Medical History:  Diagnosis Date   ETOH abuse    Hypertension    Seizures ([place])     Past Surgical History:  Procedure Laterality Date   OPEN REDUCTION INTERNAL FIXATION (ORIF) TIBIA/FIBULA FRACTURE Right 02/01/2019   Procedure: OPEN REDUCTION INTERNAL FIXATION (ORIF) TIBIA/FIBULA FRACTURE;  Surgeon: Netta Cedars, MD;  Location: [facility] ORS;  Service: Orthopedics;  Laterality: Right;   Family History:  Family History  Problem Relation Age of Onset   Diabetes Neg Hx    CAD Neg Hx    Family Psychiatric  History:  Social History:  Social History   Substance and Sexual Activity  Alcohol Use Yes   Alcohol/week: 3.0 standard drinks   Types: 3 Cans of beer per week   Comment: 3 64 oz can of beer     Social History    Substance and Sexual Activity  Drug Use Never    Social History   Socioeconomic History   Marital status: Single    Spouse name: Not on file   Number of children: Not on file   Years of education: Not on file   Highest education level: Not on file  Occupational History   Not on file  Tobacco Use   Smoking status: Every Day    Packs/day: 0.50    Types: Cigarettes   Smokeless tobacco: Never  Substance and Sexual Activity   Alcohol use: Yes    Alcohol/week: 3.0 standard drinks    Types: 3 Cans of beer per week    Comment: 3 64 oz can of beer   Drug use: Never   Sexual activity: Not on file  Other  Topics Concern   Not on file  Social History Narrative   Not on file   Social Determinants of Health   Financial Resource Strain: Not on file  Food Insecurity: Not on file  Transportation Needs: Not on file  Physical Activity: Not on file  Stress: Not on file  Social Connections: Not on file   Additional Social History:    Allergies:   Allergies  Allergen Reactions   Codeine Rash    Labs:  Results for orders placed or performed during the hospital encounter of 08/13/21 (from the past 48 hour(s))  Troponin I (High Sensitivity)     Status: None   Collection Time: 08/28/21  8:05 PM  Result Value Ref Range   Troponin I (High Sensitivity) 5 <18 ng/L    Comment: (NOTE) Elevated high sensitivity troponin I (hsTnI) values and significant  changes across serial measurements may suggest ACS but many other  chronic and acute conditions are known to elevate hsTnI results.  Refer to the Links section for chest pain algorithms and additional  guidance. Performed at [facility], [place] [address], [place], [place] [id number]   Basic metabolic panel     Status: Abnormal   Collection Time: 08/29/21  5:54 AM  Result Value Ref Range   Sodium 135 135 - 145 mmol/L   Potassium 3.4 (L) 3.5 - 5.1 mmol/L   Chloride 95 (L) 98 - 111 mmol/L   CO2 29 22 - 32 mmol/L   Glucose, Bld  110 (H) 70 - 99 mg/dL    Comment: Glucose reference range applies only to samples taken after fasting for at least 8 hours.   BUN 17 8 - 23 mg/dL   Creatinine, Ser 0.96 0.61 - 1.24 mg/dL   Calcium 9.5 8.9 - 10.3 mg/dL   GFR, Estimated >60 >60 mL/min    Comment: (NOTE) Calculated using the CKD-EPI Creatinine Equation (2021)    Anion gap 11 5 - 15    Comment: Performed at [facility] [address], [place], [place] [address]    Current Facility-Administered Medications  Medication Dose Route Frequency Provider Last Rate Last Admin   acetaminophen (TYLENOL) tablet 650 mg  650 mg Oral Q6H PRN Alen Bleacher, MD   650 mg at 08/19/21 0900   Or   acetaminophen (TYLENOL) suppository 650 mg  650 mg Rectal Q6H PRN Alen Bleacher, MD       amLODipine (NORVASC) tablet 10 mg  10 mg Oral Daily Gerrit Heck, MD   10 mg at 08/29/21 [id number]   diclofenac Sodium (VOLTAREN) 1 % topical gel 2 g  2 g Topical BID PRN Gerrit Heck, MD   2 g at 08/19/21 2112   feeding supplement (BOOST / RESOURCE BREEZE) liquid 1 Container  1 Container Oral TID BM Gerrit Heck, MD   1 Container at 08/29/21 0859   FLUoxetine (PROZAC) capsule 10 mg  10 mg Oral Daily Gerrit Heck, MD   10 mg at [id number]   folic acid (FOLVITE) tablet 1 mg  1 mg Oral Daily Alen Bleacher, MD   1 mg at 08/29/21 0859   hydrochlorothiazide (HYDRODIURIL) tablet 25 mg  25 mg Oral Daily Eppie Gibson, MD   25 mg at 08/29/21 0858   melatonin tablet 5 mg  5 mg Oral QHS Holley Bouche, MD   5 mg at 08/28/21 2321   multivitamin with minerals tablet 1 tablet  1 tablet Oral Daily Alen Bleacher, MD   1  tablet at 08/29/21 0859   polyethylene glycol (MIRALAX / GLYCOLAX) packet 17 g  17 g Oral Daily Gerrit Heck, MD   17 g at 08/28/21 [id number]   ramelteon (ROZEREM) tablet 8 mg  8 mg Oral QHS Gifford Shave, MD   8 mg at 08/28/21 2321   rosuvastatin (CRESTOR) tablet 20 mg  20 mg Oral Daily Gerrit Heck, MD   20 mg at 08/29/21 0859    simethicone (MYLICON) chewable tablet 80 mg  80 mg Oral QID PRN Gerrit Heck, MD       thiamine tablet 100 mg  100 mg Oral Daily Alen Bleacher, MD   100 mg at 08/29/21 [id number]   Or   thiamine (B-1) injection 100 mg  100 mg Intravenous Daily Alen Bleacher, MD   100 mg at 08/14/21 1203    Musculoskeletal: Strength & Muscle Tone: decreased Gait & Station:  Deferred Patient leans: Backward            Psychiatric Specialty Exam:  Presentation  General Appearance: Appropriate for Environment  Eye Contact:Good  Speech:Clear and Coherent  Speech Volume:Normal  Handedness:No data recorded  Mood and Affect  Mood:Anxious  Affect:Constricted   Thought Process  Thought Processes:Coherent; Goal Directed; Linear  Descriptions of Associations:Intact  Orientation:Full (Time, Place and Person)  Thought Content:Logical  History of Schizophrenia/Schizoaffective disorder:No data recorded Duration of Psychotic Symptoms:No data recorded Hallucinations:Hallucinations: None  Ideas of Reference:None  Suicidal Thoughts:Suicidal Thoughts: No  Homicidal Thoughts:Homicidal Thoughts: No   Sensorium  Memory:Immediate Good; Recent Good; Remote Good  Judgment:Fair  Insight:Good   Executive Functions  Concentration:Good  Attention Span:Good  [place] of Knowledge:Good  Language:Good   Psychomotor Activity  Psychomotor Activity:Psychomotor Activity: Normal   Assets  Assets:Communication Skills; Desire for Improvement; Housing; Social Support; Physical Health   Sleep  Sleep:Sleep: Poor   Physical Exam: Physical Exam Neurological:     Mental Status: He is oriented to person, place, and time.   Review of Systems  Psychiatric/Behavioral:  Negative for depression, hallucinations and suicidal ideas. The patient is nervous/anxious and has insomnia.   Blood pressure 126/88, pulse 76, temperature 98.6 F (37 C), temperature source Oral, resp. rate 20,  height 5\' 8"  (1.727 m), weight 79.9 kg, SpO2 96 %. Body mass index is 26.78 kg/m.   Armando Reichert, MD 08/29/2021 1:20 PM

## 2021-08-29 NOTE — Plan of Care (Signed)
Pt is alert oriented x 4. Pt uses walker, +1 assist pt has left sided weakness. Pt denies pain. Pt resting, scheduled medication given.   Problem: Education: Goal: Knowledge of General Education information will improve Description: Including pain rating scale, medication(s)/side effects and non-pharmacologic comfort measures Outcome: Progressing   Problem: Health Behavior/Discharge Planning: Goal: Ability to manage health-related needs will improve Outcome: Progressing   Problem: Clinical Measurements: Goal: Ability to maintain clinical measurements within normal limits will improve Outcome: Progressing Goal: Will remain free from infection Outcome: Progressing Goal: Diagnostic test results will improve Outcome: Progressing Goal: Respiratory complications will improve Outcome: Progressing Goal: Cardiovascular complication will be avoided Outcome: Progressing   Problem: Activity: Goal: Risk for activity intolerance will decrease Outcome: Progressing   Problem: Nutrition: Goal: Adequate nutrition will be maintained Outcome: Progressing   Problem: Coping: Goal: Level of anxiety will decrease Outcome: Progressing   Problem: Elimination: Goal: Will not experience complications related to bowel motility Outcome: Progressing Goal: Will not experience complications related to urinary retention Outcome: Progressing   Problem: Safety: Goal: Ability to remain free from injury will improve Outcome: Progressing   Problem: Skin Integrity: Goal: Risk for impaired skin integrity will decrease Outcome: Progressing

## 2021-08-29 NOTE — Progress Notes (Signed)
Occupational Therapy Treatment Patient Details Name: Jerry Humphrey MRN: [id number] DOB: [birth year] Today's Date: 08/29/2021   History of present illness Pt is a 61 y/o male presenting on 1/22 with L sided weakness, facial droop and dysarthria.  CT head with R subcortical hemorrhage and midline shift. PMH includes: ETOH abuse, HTN, seizures.   OT comments  [name] is progressing well, see updated acute OT goals in the care plan. Pt sat EOB with good dynamic sitting balance for LUE exercises and mirror for midline posture. Overall he needed min A for functional ambulation and toilet transfer. Pt benefits from cues for L attention, safety, balance, RW management and to minimize impulsivity. D/c remains appropriate, OT to continue to follow acutely.    Recommendations for follow up therapy are one component of a multi-disciplinary discharge planning process, led by the attending physician.  Recommendations may be updated based on patient status, additional functional criteria and insurance authorization.    Follow Up Recommendations  Skilled nursing-short term rehab (<3 hours/day)    Assistance Recommended at Discharge Frequent or constant Supervision/Assistance  Patient can return home with the following  A lot of help with walking and/or transfers;A lot of help with bathing/dressing/bathroom;Assistance with cooking/housework;Assistance with feeding;Direct supervision/assist for medications management;Direct supervision/assist for financial management;Assist for transportation;Help with stairs or ramp for entrance   Equipment Recommendations  Other (comment)    Recommendations for Other Services      Precautions / Restrictions Precautions Precautions: Fall Precaution Comments: impulsivity, L inattention (improving) Restrictions Weight Bearing Restrictions: No       Mobility Bed Mobility Overal bed mobility: Needs Assistance Bed Mobility: Supine to Sit, Sit to Supine     Supine to  sit: Supervision Sit to supine: Supervision   General bed mobility comments: no physical assist needed    Transfers Overall transfer level: Needs assistance Equipment used: Rolling walker (2 wheels) Transfers: Sit to/from Stand Sit to Stand: Min assist           General transfer comment: therapist positioned to the L of the pt for transfer to encourage midline posture, min A for steadying     Balance Overall balance assessment: Needs assistance Sitting-balance support: Feet supported, Single extremity supported Sitting balance-Leahy Scale: Good   Postural control: Left lateral lean Standing balance support: Single extremity supported, During functional activity Standing balance-Leahy Scale: Poor                             ADL either performed or assessed with clinical judgement   ADL                           Toilet Transfer: Minimal assistance;Rolling walker (2 wheels);Ambulation;Regular Glass blower/designer Details (indicate cue type and reason): min A for midline posture, L attention and safety         Functional mobility during ADLs: Minimal assistance;Rolling walker (2 wheels);Cueing for sequencing;Cueing for safety General ADL Comments: pt with improved balance this session, benefits from cues for attention and safety    Extremity/Trunk Assessment Upper Extremity Assessment Upper Extremity Assessment: LUE deficits/detail LUE Deficits / Details: grossly 3-/5 MMT with poor coordination, sensation and neglect vs inattention LUE Sensation: decreased light touch;decreased proprioception LUE Coordination: decreased fine motor;decreased gross motor   Lower Extremity Assessment Lower Extremity Assessment: Defer to PT evaluation        Vision   Vision Assessment?: Yes Eye Alignment: Within  Functional Limits Ocular Range of Motion: Within Functional Limits Alignment/Gaze Preference: Within Defined Limits Tracking/Visual Pursuits:  Impaired - to be further tested in functional context Visual Fields: Impaired-to be further tested in functional context   Perception Perception Perception: Not tested   Praxis Praxis Praxis: Not tested    Cognition Arousal/Alertness: Awake/alert Behavior During Therapy: WFL for tasks assessed/performed, Impulsive Overall Cognitive Status: No family/caregiver present to determine baseline cognitive functioning Area of Impairment: Attention, Following commands, Safety/judgement, Awareness, Problem solving                   Current Attention Level: Selective Memory: Decreased short-term memory Following Commands: Follows one step commands consistently Safety/Judgement: Decreased awareness of deficits, Decreased awareness of safety Awareness: Emergent Problem Solving: Slow processing, Requires verbal cues, Requires tactile cues General Comments: poor carry over of education, benefits from constant cues for safety. lacks insight        Exercises Other Exercises Other Exercises: Yellow theraputty exercises with LUE: log rolls, finger to thumb pinches, bimanual ball roll, gross grasp squeeze.    Shoulder Instructions       General Comments VSS on RA    Pertinent Vitals/ Pain       Pain Assessment Pain Assessment: No/denies pain Pain Intervention(s): Monitored during session  Home Living                                          Prior Functioning/Environment              Frequency  Min 2X/week        Progress Toward Goals  OT Goals(current goals can now be found in the care plan section)  Progress towards OT goals: Progressing toward goals;Goals met and updated - see care plan  Acute Rehab OT Goals Patient Stated Goal: get walking again OT Goal Formulation: With patient Time For Goal Achievement: 09/11/21 Potential to Achieve Goals: Good ADL Goals Pt Will Perform Grooming: with modified independence;standing Pt Will Perform Upper  Body Dressing: with modified independence;sitting Pt Will Perform Lower Body Dressing: with set-up;sit to/from stand Pt/caregiver will Perform Home Exercise Program: Increased ROM;Increased strength;Left upper extremity;With written HEP provided;With theraputty Additional ADL Goal #1: pt will locate 100% of items to his L with compensatory techniques to complete BADLs  Plan Discharge plan remains appropriate    Co-evaluation                 AM-PAC OT "6 Clicks" Daily Activity     Outcome Measure   Help from another person eating meals?: A Little Help from another person taking care of personal grooming?: A Little Help from another person toileting, which includes using toliet, bedpan, or urinal?: A Little Help from another person bathing (including washing, rinsing, drying)?: A Lot Help from another person to put on and taking off regular upper body clothing?: A Little Help from another person to put on and taking off regular lower body clothing?: A Lot 6 Click Score: 16    End of Session Equipment Utilized During Treatment: Gait belt (yellow theraputty)  OT Visit Diagnosis: Other abnormalities of gait and mobility (R26.89);Muscle weakness (generalized) (M62.81);Hemiplegia and hemiparesis;Other symptoms and signs involving cognitive function Hemiplegia - Right/Left: Left Hemiplegia - dominant/non-dominant: Non-Dominant Hemiplegia - caused by: Nontraumatic intracerebral hemorrhage   Activity Tolerance Patient tolerated treatment well   Patient Left in bed;with call bell/phone within reach;with  bed alarm set;with nursing/sitter in room   Nurse Communication Mobility status        Time: 2094-7096 OT Time Calculation (min): 24 min  Charges: OT General Charges $OT Visit: 1 Visit OT Treatments $Self Care/Home Management : 8-22 mins $Therapeutic Activity: 8-22 mins   Monay Houlton A Katja Blue 08/29/2021, 5:19 PM

## 2021-08-30 NOTE — Progress Notes (Signed)
FPTS Interim Progress Note  S:Patient sleeping and resting comfortably.    O: BP 111/83 (BP Location: Right Arm)    Pulse 75    Temp 98.2 F (36.8 C)    Resp 18    Ht 5\' 8"  (1.727 m)    Wt 79.9 kg    SpO2 95%    BMI 26.78 kg/m    GEN: sleeping  RESP: equal chest rise and fall    A/P: No changes to current plan. See daily progress note.  - Orders reviewed. Labs for AM not ordered, which was adjusted as needed.   , DO 08/30/2021, 12:34 AM PGY-3, [facility] Night Resident  Please page [phone] with questions.

## 2021-08-30 NOTE — Progress Notes (Addendum)
Family Medicine Teaching Service Daily Progress Note Intern Pager: [phone]  Patient name: Jerry Humphrey Medical record number: [id number] Date of birth: 05-10-1961 Age: 61 y.o. Gender: male  Primary Care Provider: Pcp, No Consultants: Neuro s/o, Psychiatry s/o Code Status: Full  Pt Overview and Major Events to Date:  1/22 admitted 2/1 stable 2/6 SI 2/7 cleared by psychiatry for SNF  Assessment and Plan:  Jerry Humphrey is a 61 yo male who presented with left sided weakness found to have a hemmorhaghic CVA. PMH significant for alcohol withdrawal seizures, htn, alcohol use disorder, tobacco use disorder. Medically stable for SNF placement  Hemmorhagic CVA Likely 2/2 to poorly controlled HTN and AUD. PT/OT recommended SNF. Neuro signed off.  -outpatient repeat MRI and cta Head/Neck for hematoma resolution, will reach out to neuro today to see if repeat should be done in hospital given length of his stay -Will discuss DVT prophylaxis in hospital as well -rosuvastatin 20 mg daily -not on antiplatelet currently  Hyponatremia chronic/stable Na has been stable at 135 -weekly BMP  HTN BP range of 121-130/80-93. -amlodipine 10 mg daily -HCTZ 12.5 mg daily  Suicidal Ideation, resolved Denies SI, psychiatry cleared for SNF -Prozac 10 mg daily to 20 mg  Chronic/Stable Alcohol Use Disorder-thiamine, folate, MVI Chest pain, resolved- monitor Insomnia-continue ramelteon, DC melatonin  FEN/GI: Regular PPx: SCDs Dispo:Medically stable, SNF barriers include bed availability   Subjective:  Patient does feel somewhat depressed today but denies any SI.  No acute pain or issues.  Objective: Temp:  [98 F (36.7 C)-98.5 F (36.9 C)] 98.4 F (36.9 C) (02/08 1602) Pulse Rate:  [64-75] 69 (02/08 1602) Resp:  [16-18] 16 (02/08 1602) BP: (100-130)/(79-93) 130/93 (02/08 1602) SpO2:  [95 %-98 %] 97 % (02/08 1602) Physical Exam: General: NAD, laying in bed comfortably, alert and  responsive to questions Cardiovascular: RRR no murmurs rubs or gallops Respiratory: Clear to auscultation bilaterally no wheezes rales or crackles Abdomen: Soft, nontender to palpation Extremities: No lower extremity edema  Laboratory: No results for input(s): WBC, HGB, HCT, PLT in the last 168 hours. Recent Labs  Lab 08/29/21 0554  NA 135  K 3.4*  CL 95*  CO2 29  BUN 17  CREATININE 0.96  CALCIUM 9.5  GLUCOSE 110*    Imaging/Diagnostic Tests:   Gerrit Heck, MD 08/30/2021, 9:42 PM PGY-1, [facility] Intern pager: [phone], text pages welcome

## 2021-08-30 NOTE — Progress Notes (Signed)
FPTS Brief Note   Reviewed patient's vitals, recent notes.  Vitals:   08/30/21 0824 08/30/21 1602  BP: 100/79 (!) 130/93  Pulse: 64 69  Resp: 16 16  Temp: 98.5 F (36.9 C) 98.4 F (36.9 C)  SpO2: 97% 97%   At this time, no change in plan from day progress note. Stable for discharge, awaiting SNF.   Erskine Emery, MD Page [phone] with questions about this patient.

## 2021-08-30 NOTE — Progress Notes (Signed)
Physical Therapy Treatment Patient Details Name: Jerry Humphrey MRN: [id number] DOB: 12/20/1960 Today's Date: 08/30/2021   History of Present Illness Pt is a 61 y/o male presenting on 1/22 with L sided weakness, facial droop and dysarthria.  CT head with R subcortical hemorrhage and midline shift. PMH includes: ETOH abuse, HTN, seizures.    PT Comments    Pt tolerates treatment well but continues to demonstrate L sided weakness and sensation deficits. Pt with multiple losses of balance to the left side, especially with fatigue or when distracted. Pt continues to require cues to attend to balance deviations, with limited awareness of lateral drift and lean until physical assistance is required to correct. Pt will benefit from continued aggressive mobilization to aide in a return to independence.   Recommendations for follow up therapy are one component of a multi-disciplinary discharge planning process, led by the attending physician.  Recommendations may be updated based on patient status, additional functional criteria and insurance authorization.  Follow Up Recommendations  Skilled nursing-short term rehab (<3 hours/day)     Assistance Recommended at Discharge Frequent or constant Supervision/Assistance  Patient can return home with the following A lot of help with bathing/dressing/bathroom;Assistance with cooking/housework;Direct supervision/assist for medications management;Assist for transportation;Help with stairs or ramp for entrance;A lot of help with walking and/or transfers   Equipment Recommendations  Wheelchair cushion (measurements PT);Wheelchair (measurements PT)    Recommendations for Other Services       Precautions / Restrictions Precautions Precautions: Fall Precaution Comments: impulsive, mild L inattention Restrictions Weight Bearing Restrictions: No     Mobility  Bed Mobility Overal bed mobility: Needs Assistance Bed Mobility: Supine to Sit, Sit to Supine      Supine to sit: Modified independent (Device/Increase time) Sit to supine: Modified independent (Device/Increase time)        Transfers Overall transfer level: Needs assistance Equipment used: Rolling walker (2 wheels) Transfers: Sit to/from Stand Sit to Stand: Min guard, Min assist           General transfer comment: verbal cues from hand placement, instances of left and posterior lean at times during transfer attempts    Ambulation/Gait Ambulation/Gait assistance: Min assist, Mod assist Gait Distance (Feet): 100 Feet (additional trials on 75', 50') Assistive device: Rolling walker (2 wheels) Gait Pattern/deviations: Step-through pattern, Drifts right/left Gait velocity: reduced Gait velocity interpretation: <1.8 ft/sec, indicate of risk for recurrent falls   General Gait Details: pt with slowed step-through gait, drifts to left side with fatigue resulting in leftward losses of balance. Pt is able to correct balance deviations if provided with verbal/tactile cues. Pt with LOB with head turns or when changing direction.   Stairs             Wheelchair Mobility    Modified Rankin (Stroke Patients Only) Modified Rankin (Stroke Patients Only) Pre-Morbid Rankin Score: No symptoms Modified Rankin: Moderately severe disability     Balance Overall balance assessment: Needs assistance Sitting-balance support: No upper extremity supported, Feet supported Sitting balance-Leahy Scale: Good Sitting balance - Comments: performs toileting tasks without assistance   Standing balance support: Single extremity supported, Bilateral upper extremity supported Standing balance-Leahy Scale: Poor Standing balance comment: left lean intermittently                            Cognition Arousal/Alertness: Awake/alert Behavior During Therapy: WFL for tasks assessed/performed, Impulsive Overall Cognitive Status: No family/caregiver present to determine baseline  cognitive functioning Area  of Impairment: Following commands, Safety/judgement, Awareness                       Following Commands: Follows one step commands consistently Safety/Judgement: Decreased awareness of safety Awareness: Emergent            Exercises      General Comments General comments (skin integrity, edema, etc.): VSS on RA      Pertinent Vitals/Pain Pain Assessment Pain Assessment: No/denies pain    Home Living                          Prior Function            PT Goals (current goals can now be found in the care plan section) Acute Rehab PT Goals Patient Stated Goal: to go home Progress towards PT goals: Progressing toward goals    Frequency    Min 4X/week      PT Plan Current plan remains appropriate    Co-evaluation              AM-PAC PT "6 Clicks" Mobility   Outcome Measure  Help needed turning from your back to your side while in a flat bed without using bedrails?: None Help needed moving from lying on your back to sitting on the side of a flat bed without using bedrails?: None Help needed moving to and from a bed to a chair (including a wheelchair)?: A Little Help needed standing up from a chair using your arms (e.g., wheelchair or bedside chair)?: A Little Help needed to walk in hospital room?: A Lot Help needed climbing 3-5 steps with a railing? : Total 6 Click Score: 17    End of Session   Activity Tolerance: Patient tolerated treatment well Patient left: in bed;with call bell/phone within reach;with bed alarm set Nurse Communication: Mobility status PT Visit Diagnosis: Unsteadiness on feet (R26.81);Other abnormalities of gait and mobility (R26.89);Muscle weakness (generalized) (M62.81)     Time: 6151-8343 PT Time Calculation (min) (ACUTE ONLY): 34 min  Charges:  $Gait Training: 8-22 mins $Therapeutic Activity: 8-22 mins                     Arlyss Gandy, PT, DPT Acute Rehabilitation Pager:  [phone] Office [phone]    Arlyss Gandy 08/30/2021, 12:28 PM

## 2021-08-31 MED ORDER — FLUOXETINE HCL 20 MG PO CAPS
20.0000 mg | ORAL_CAPSULE | Freq: Every day | ORAL | Status: DC
  Administered 2021-09-01: 20 mg via ORAL
  Filled 2021-08-31: qty 1

## 2021-08-31 MED ORDER — FLUOXETINE HCL 10 MG PO CAPS
10.0000 mg | ORAL_CAPSULE | Freq: Once | ORAL | Status: AC
  Administered 2021-08-31: 10 mg via ORAL
  Filled 2021-08-31: qty 1

## 2021-08-31 NOTE — Plan of Care (Signed)
°  Problem: Education: Goal: Knowledge of General Education information will improve Description: Including pain rating scale, medication(s)/side effects and non-pharmacologic comfort measures Outcome: Progressing   Problem: Health Behavior/Discharge Planning: Goal: Ability to manage health-related needs will improve Outcome: Progressing   Problem: Clinical Measurements: Goal: Ability to maintain clinical measurements within normal limits will improve Outcome: Progressing Goal: Will remain free from infection Outcome: Progressing Goal: Diagnostic test results will improve Outcome: Progressing Goal: Respiratory complications will improve Outcome: Progressing Goal: Cardiovascular complication will be avoided Outcome: Progressing   Problem: Skin Integrity: Goal: Risk for impaired skin integrity will decrease Outcome: Progressing   Problem: Safety: Goal: Ability to remain free from injury will improve Outcome: Progressing

## 2021-08-31 NOTE — Progress Notes (Signed)
FPTS Brief Note Reviewed patient's vitals, recent notes.  Vitals:   08/31/21 1459 08/31/21 2021  BP: 124/77 139/87  Pulse: 74 71  Resp: 20 17  Temp: 98.2 F (36.8 C) 98.1 F (36.7 C)  SpO2: 100% 96%   At this time, no change in plan from day progress note.  Katha Cabal, DO Page [phone] with questions about this patient.

## 2021-08-31 NOTE — Progress Notes (Signed)
Physical Therapy Treatment Patient Details Name: Jerry Humphrey MRN: [id number] DOB: June 03, 1961 Today's Date: 08/31/2021   History of Present Illness Pt is a 61 y/o male presenting on 1/22 with L sided weakness, facial droop and dysarthria.  CT head with R subcortical hemorrhage and midline shift. PMH includes: ETOH abuse, HTN, seizures.    PT Comments     Pt is eager to participate.  Making notable progress toward goals.  Emphasis on transition and transfer safety, progression of gait with the RW and assist with negotiation of stairs plus stress on awareness of improvements and continued deficits and limitations.    Recommendations for follow up therapy are one component of a multi-disciplinary discharge planning process, led by the attending physician.  Recommendations may be updated based on patient status, additional functional criteria and insurance authorization.  Follow Up Recommendations  Skilled nursing-short term rehab (<3 hours/day)     Assistance Recommended at Discharge Intermittent Supervision/Assistance  Patient can return home with the following A little help with walking and/or transfers;A little help with bathing/dressing/bathroom;Assistance with cooking/housework;Direct supervision/assist for medications management;Direct supervision/assist for financial management;Assist for transportation;Help with stairs or ramp for entrance   Equipment Recommendations  Wheelchair cushion (measurements PT);Wheelchair (measurements PT)    Recommendations for Other Services       Precautions / Restrictions Precautions Precautions: Fall Precaution Comments: mildly impulsive with continued mild inattention     Mobility  Bed Mobility Overal bed mobility: Modified Independent Bed Mobility: Supine to Sit, Sit to Supine     Supine to sit: Modified independent (Device/Increase time) Sit to supine: Modified independent (Device/Increase time)        Transfers Overall transfer  level: Needs assistance   Transfers: Sit to/from Stand Sit to Stand: Min guard           General transfer comment: min grard for safety due to patient still lacks control of movement    Ambulation/Gait Ambulation/Gait assistance: Min guard, Min assist, Mod assist, +2 safety/equipment Gait Distance (Feet): 280 Feet Assistive device: Rolling walker (2 wheels) Gait Pattern/deviations: Step-through pattern Gait velocity: reduced Gait velocity interpretation: 1.31 - 2.62 ft/sec, indicative of limited community ambulator   General Gait Details: Until fatigue which started after ~140 feet and ascent/descent of stairs, pt able to ambulate with RW and close guard assist.  Pt still biased L, needing L UE assist on the RW to help shift R toward midline with mildly widen BOS, some drift L and R fignting for balance.  On return after pt begun to fatigue, he was unable to maintain balance and slowly, steadily needed more stability for lean to the L and assist to correct for RW drift.   Stairs             Wheelchair Mobility    Modified Rankin (Stroke Patients Only) Modified Rankin (Stroke Patients Only) Pre-Morbid Rankin Score: No symptoms Modified Rankin: Moderately severe disability     Balance Overall balance assessment: Needs assistance Sitting-balance support: No upper extremity supported Sitting balance-Leahy Scale: Good     Standing balance support: Single extremity supported, Bilateral upper extremity supported Standing balance-Leahy Scale: Poor Standing balance comment: notable improvement in standing balance, both statically and dynamically, but after fatigue, but still can not sustain balance without some external support in addition to the AD.                            Cognition Arousal/Alertness: Awake/alert Behavior During Therapy:  WFL for tasks assessed/performed, Impulsive                           Following Commands: Follows one step  commands consistently Safety/Judgement:  (improving safety awareness) Awareness: Emergent            Exercises      General Comments        Pertinent Vitals/Pain Pain Assessment Pain Assessment: No/denies pain Pain Intervention(s): Monitored during session    Home Living                          Prior Function            PT Goals (current goals can now be found in the care plan section) Acute Rehab PT Goals Patient Stated Goal: to go home PT Goal Formulation: With patient Time For Goal Achievement: 09/11/21 Potential to Achieve Goals: Good Progress towards PT goals: Progressing toward goals    Frequency    Min 4X/week      PT Plan Current plan remains appropriate    Co-evaluation              AM-PAC PT "6 Clicks" Mobility   Outcome Measure  Help needed turning from your back to your side while in a flat bed without using bedrails?: None Help needed moving from lying on your back to sitting on the side of a flat bed without using bedrails?: None Help needed moving to and from a bed to a chair (including a wheelchair)?: A Little Help needed standing up from a chair using your arms (e.g., wheelchair or bedside chair)?: A Little Help needed to walk in hospital room?: A Lot Help needed climbing 3-5 steps with a railing? : A Lot 6 Click Score: 18    End of Session         PT Visit Diagnosis: Unsteadiness on feet (R26.81);Other abnormalities of gait and mobility (R26.89);Difficulty in walking, not elsewhere classified (R26.2)     Time: [id number] PT Time Calculation (min) (ACUTE ONLY): 16 min  Charges:  $Gait Training: 8-22 mins                     08/31/2021  Ginger Carne., PT Acute Rehabilitation Services [phone]  (pager) [phone]  (office)   Tessie Fass Tonica Brasington 08/31/2021, 1:07 PM

## 2021-08-31 NOTE — Progress Notes (Incomplete)
Family Medicine Teaching Service Daily Progress Note Intern Pager: [phone] ***date/refresh Patient name: Jerry Humphrey Medical record number: [id number] Date of birth: 1961/03/03 Age: 61 y.o. Gender: male  Primary Care Provider: Pcp, No Consultants: Neuro signed off, psychiatry signed off Code Status: Full  Pt Overview and Major Events to Date:  1/22 admitted 2/1 stable 2/6 SI 2/7 and cleared by psychiatry for SNF  Assessment and Plan:  Jerry Humphrey is a 61 yo male who presented with left sided weakness found to have a hemmorhaghic CVA. PMH significant for alcohol withdrawal seizures, htn, alcohol use disorder, tobacco use disorder. Medically stable for SNF placement  Hemorrhagic CVA Likely secondary to poorly controlled hypertension and alcohol use disorder.  PT/OT recommended SNF and currently awaiting placement.  Neurology has signed off. -Outpatient repeat MRI and CTA head/neck for hematoma resolution (per neurology note needs it at time of follow-up as outpatient 4 weeks after and currently has been ~ 2 weeks) -Rosuvastatin 20 mg daily -Not on antiplatelet currently -Continue SCDs  Hyponatremia chronic/stable Sodium has been stable at 135 -Weekly BMPs  Hypertension Blood pressure ranges have been*** -Amlodipine 10 mg daily -HCTZ 12.5 mg daily  SI, resolved -Prozac 20 mg daily  Chronic/stable Alcohol use disorder-thiamine, folate, MVI Chest pain, resolved-monitoring Insomnia-continue ramelteon  FEN/GI: Regular PPx: SCDs Dispo: Medically stable SNF, barriers include bed availability  Subjective:  ***  Objective: Temp:  [97.6 F (36.4 C)-98.4 F (36.9 C)] 97.6 F (36.4 C) (02/09 1100) Pulse Rate:  [62-72] 72 (02/09 1100) Resp:  [14-20] 14 (02/09 1100) BP: (121-130)/(80-93) 125/84 (02/09 1100) SpO2:  [96 %-97 %] 97 % (02/09 0910) Physical Exam: General: *** Cardiovascular: *** Respiratory: *** Abdomen: *** Extremities: ***  Laboratory: No  results for input(s): WBC, HGB, HCT, PLT in the last 168 hours. Recent Labs  Lab 08/29/21 0554  NA 135  K 3.4*  CL 95*  CO2 29  BUN 17  CREATININE 0.96  CALCIUM 9.5  GLUCOSE 110*    ***  Imaging/Diagnostic Tests: Levin Erp, MD 08/31/2021, 3:00 PM PGY-***, [facility] FPTS Intern pager: [phone], text pages welcome

## 2021-08-31 NOTE — Plan of Care (Signed)
Pt is alert oriented x 4. Ambulatory, +1 with walker. Pt denies pain, Left sided weakness.    Problem: Education: Goal: Knowledge of General Education information will improve Description: Including pain rating scale, medication(s)/side effects and non-pharmacologic comfort measures Outcome: Progressing   Problem: Health Behavior/Discharge Planning: Goal: Ability to manage health-related needs will improve Outcome: Progressing   Problem: Clinical Measurements: Goal: Ability to maintain clinical measurements within normal limits will improve Outcome: Progressing Goal: Will remain free from infection Outcome: Progressing Goal: Diagnostic test results will improve Outcome: Progressing Goal: Respiratory complications will improve Outcome: Progressing Goal: Cardiovascular complication will be avoided Outcome: Progressing   Problem: Activity: Goal: Risk for activity intolerance will decrease Outcome: Progressing   Problem: Nutrition: Goal: Adequate nutrition will be maintained Outcome: Progressing   Problem: Coping: Goal: Level of anxiety will decrease Outcome: Progressing   Problem: Elimination: Goal: Will not experience complications related to bowel motility Outcome: Progressing Goal: Will not experience complications related to urinary retention Outcome: Progressing   Problem: Pain Managment: Goal: General experience of comfort will improve Outcome: Progressing   Problem: Pain Managment: Goal: General experience of comfort will improve Outcome: Progressing   Problem: Safety: Goal: Ability to remain free from injury will improve Outcome: Progressing   Problem: Skin Integrity: Goal: Risk for impaired skin integrity will decrease Outcome: Progressing

## 2021-09-01 MED ORDER — FLUOXETINE HCL 20 MG PO CAPS: 20.0000 mg | ORAL_CAPSULE | Freq: Every day | ORAL | Status: DC

## 2021-09-01 MED ORDER — COVID-19 MRNA VAC-TRIS(PFIZER) 30 MCG/0.3ML IM SUSP
0.3000 mL | Freq: Once | INTRAMUSCULAR | Status: AC
  Administered 2021-09-01: 0.3 mL via INTRAMUSCULAR
  Filled 2021-09-01: qty 0.3

## 2021-09-01 MED ORDER — SIMETHICONE 80 MG PO CHEW
80.0000 mg | CHEWABLE_TABLET | Freq: Four times a day (QID) | ORAL | 0 refills | Status: AC | PRN
Start: 2021-09-01 — End: ?

## 2021-09-01 NOTE — Plan of Care (Signed)
Pt is alert oriented x 4. Pt has new complaints as of last night of neck pain, aching. PRN tylenol given. Pain rated at 5/10. After tylenol pain rated a 2. Pt continues to have decreased sensation to left side but able to move it. Pt is +1 assist with walker to bathroom. Pt also uses urinal. Pt is awake drinking coffee and watching TV at this time. Call bell within reach. No distress noted.   Problem: Education: Goal: Knowledge of General Education information will improve Description: Including pain rating scale, medication(s)/side effects and non-pharmacologic comfort measures Outcome: Progressing   Problem: Health Behavior/Discharge Planning: Goal: Ability to manage health-related needs will improve Outcome: Progressing   Problem: Clinical Measurements: Goal: Ability to maintain clinical measurements within normal limits will improve Outcome: Progressing Goal: Will remain free from infection Outcome: Progressing Goal: Diagnostic test results will improve Outcome: Progressing Goal: Respiratory complications will improve Outcome: Progressing Goal: Cardiovascular complication will be avoided Outcome: Progressing   Problem: Activity: Goal: Risk for activity intolerance will decrease Outcome: Progressing   Problem: Nutrition: Goal: Adequate nutrition will be maintained Outcome: Progressing   Problem: Coping: Goal: Level of anxiety will decrease Outcome: Progressing   Problem: Elimination: Goal: Will not experience complications related to bowel motility Outcome: Progressing Goal: Will not experience complications related to urinary retention Outcome: Progressing   Problem: Pain Managment: Goal: General experience of comfort will improve Outcome: Progressing   Problem: Safety: Goal: Ability to remain free from injury will improve Outcome: Progressing   Problem: Skin Integrity: Goal: Risk for impaired skin integrity will decrease Outcome: Progressing

## 2021-09-01 NOTE — Discharge Summary (Addendum)
Family Medicine Teaching [facility] Discharge Summary  Patient name: Jerry Humphrey Medical record number: [id number] Date of birth: 1961/06/25 Age: 61 y.o. Gender: male Date of Admission: 08/13/2021  Date of Discharge: 09/01/2021 Admitting Physician: Jerre Simon, MD  Primary Care Provider: Pcp, No Consultants: Neurology, Psychiatry  Indication for Hospitalization: Hemmorhagic CVA  Discharge Diagnoses/Problem List:  Tobacco use disorder Subcortical hemorrhage Hypertension Alcohol use Knee pain Right-sided nontraumatic intracerebral hemorrhage  Disposition: SNF  Discharge Condition: Stable  Discharge Exam:   General: Well appearing, NAD, awake, alert, responsive to questions Head: Normocephalic atraumatic CV: Regular rate and rhythm no murmurs rubs or gallops Respiratory: Clear to ausculation bilaterally, no wheezes rales or crackles, chest rises symmetrically,  no increased work of breathing Abdomen: Soft, non-tender, non-distended, normoactive bowel sounds  Extremities: Moves upper and lower extremities freely, no edema in LE Neuro: mild left sided weakness present, some mild dysarthria Skin: No rashes or lesions visualized  Psych: No SI  Brief Hospital Course:  Jerry Humphrey is a 61 y.o. male presenting with Left side weakness . PMH is significant for seizure due to alcohol withdrawal, HTN, Alcohol use and Tobacco use. His hospital course is below   Left side weakness 2/2 hemorrhagic stroke Patient reports falling 4 days prior to admission and was unable to get up due to left sided weakness.  He endorses paresthesia of extremities and left upper extremity weakness.  He also reports right upper extremity tingling but full strength and function of the right upper extremity.  On exam patient had left upper extremity weakness, profound dysarthria, facial droop and left tongue deviation. Muscle strength on RUE, RLE and LLE is 5/5 and 3/5 on LUE.  Initial labs show mildly  elevated troponin 66 >68, CMP was unremarkable with no electrolyte abnormalities, other than slightly elevated AST, ALT 64 and 74 respectively.  Creatinine was 1.28 and EKG showed sinus rhythm with no ST changes.  CT neck was unremarkable however CT head showed right subcortical hemorrhage with right to left midline shift.  Neurology was consulted who recommend avoiding anticoagulant and antiplatelet. PT/OT/SLP were consulted and saw patient during hospitalization. Patient had good progress with PT/OT/SLP and was recommended for SNF. By day of discharge, patient's neuro exam was stable.   AKI On admission Creatinine was 1.28 and improved to 1.20 on recheck. GFR >60. Patient Cr had improved and AKI resolved by discharge.  Hyponatremia Unlikely SIADH due to normal serum osmolality (should be low), with high urine osmolality. We monitored Na regularly and it was maintained from high 120s-low 130s. Patient was asymptomatic and this was stable at time of discharge.   HTN BP range on admission has ranged from normotensive to hypertensive requiring medications inpatient. Home medication include amlodipine 5 mg daily.  However patient has not had this medication in the last 2 years. Patient was started on amlodipine 10 mg as well as HCTZ 25 mg and had good control of BP with those two medications. By discharge patient BP had been well controlled.    Hx of Alcohol use  Hx of alcohol withdrawal with seizure Patient reports prior history of alcohol withdrawal with seizure requiring hospitalization.  He drinks on average two 64 ounce beer daily.  He reports his last drink was 4 days ago where he had to 64 ounce beer. While inpatient, patient CIWAS were monitored and remained low, requiring no treatment for/concern of ETOH withdrawal.   Right Knee Pain XR showed no evidence of fracture or bony abnormality. Provided  Diclofenac gel for pain relief.   Suicidal Ideation On 2/6 patient stated plan/intent for  suicide. Suicide precautions were initiated. Psychaitry saw patient and cleared him for SNF. He was started on Prozac 20 mg daily.  Issues for Follow Up:  Wheelchair at d/c w/ cushion Repeat MRI and CTA of head and neck once hematoma resolves in outpatient setting (at neurology appointment ~2/22) Monitor BP to maintain strict control due to previous hemorrhagic stroke  Hyponatremia to be followed outpatient with BMP  Follow up with Neurology outpatient around 2/22 Continue to encourage tobacco and alcohol cessation with resources to help Follow up SSRI efficacy, consider therapy Will need repeat COVID vaccine (received Pfizer 1st dose at discharge)  Significant Procedures: None  Significant Labs and Imaging:  No results for input(s): WBC, HGB, HCT, PLT in the last 168 hours. Recent Labs  Lab 08/29/21 0554  NA 135  K 3.4*  CL 95*  CO2 29  GLUCOSE 110*  BUN 17  CREATININE 0.96  CALCIUM 9.5    Results/Tests Pending at Time of Discharge:   Discharge Medications:  Allergies as of 09/01/2021       Reactions   Codeine Rash        Medication List     TAKE these medications    amLODipine 10 MG tablet Commonly known as: NORVASC Take 1 tablet (10 mg total) by mouth daily.   FLUoxetine 20 MG capsule Commonly known as: PROZAC Take 1 capsule (20 mg total) by mouth daily. Start taking on: September 02, 2021   folic acid 1 MG tablet Commonly known as: FOLVITE Take 1 tablet (1 mg total) by mouth daily.   hydrochlorothiazide 25 MG tablet Commonly known as: HYDRODIURIL Take 1 tablet (25 mg total) by mouth daily.   rosuvastatin 20 MG tablet Commonly known as: CRESTOR Take 1 tablet (20 mg total) by mouth daily.   simethicone 80 MG chewable tablet Commonly known as: MYLICON Chew 1 tablet (80 mg total) by mouth 4 (four) times daily as needed for flatulence.   thiamine 100 MG tablet Take 1 tablet (100 mg total) by mouth daily.        Discharge Instructions: Please  refer to Patient Instructions section of EMR for full details.  Patient was counseled important signs and symptoms that should prompt return to medical care, changes in medications, dietary instructions, activity restrictions, and follow up appointments.   Follow-Up Appointments:  Follow-up Information     [facility]. Schedule an appointment as soon as possible for a visit in 1 month(s).   Specialty: Neurology Why: stroke clinic Contact information: [address] [place] [address] [phone]                Levin Erp, MD 09/01/2021, 9:20 AM PGY-1, [facility]

## 2021-09-01 NOTE — TOC Progression Note (Signed)
Transition of Care [facility]) - Progression Note    Patient Details  Name: Jerry Humphrey MRN: [id number] Date of Birth: [birth year]  Transition of Care [facility]) CM/SW Contact  Baldemar Lenis, [place] Phone Number: 09/01/2021, 11:03 AM  Clinical Narrative:   CSW attempted to reach [facility] throughout the day today, finally got a call back at end of the day that patient can admit to SNF. Bed will be available tomorrow. CSW to follow.    Expected Discharge Plan: Skilled Nursing Facility Barriers to Discharge: Barriers Resolved  Expected Discharge Plan and Services Expected Discharge Plan: Skilled Nursing Facility     Post Acute Care Choice: Skilled Nursing Facility Living arrangements for the past 2 months: Mobile Home Expected Discharge Date: 09/01/21                                     Social Determinants of Health (SDOH) Interventions    Readmission Risk Interventions No flowsheet data found.

## 2021-09-01 NOTE — Progress Notes (Signed)
Report called and given to [facility] at [facility].

## 2021-09-01 NOTE — TOC Transition Note (Signed)
Transition of Care [facility]) - CM/SW Discharge Note   Patient Details  Name: Jerry Humphrey MRN: [id number] Date of Birth: 01-18-1961  Transition of Care [facility]) CM/SW Contact:  Baldemar Lenis, LCSW Phone Number: 09/01/2021, 11:04 AM   Clinical Narrative:   Nurse to call report to [phone], Room B18 Bed 2.    Final next level of care: Skilled Nursing Facility Barriers to Discharge: Barriers Resolved   Patient Goals and CMS Choice Patient states their goals for this hospitalization and ongoing recovery are:: to get rehab CMS Medicare.gov Compare Post Acute Care list provided to:: Patient Choice offered to / list presented to : Patient  Discharge Placement              Patient chooses bed at:  [facility]) Patient to be transferred to facility by: [facility] Name of family member notified: Mellody Dance Patient and family notified of of transfer: 09/01/21  Discharge Plan and Services     Post Acute Care Choice: Skilled Nursing Facility                               Social Determinants of Health (SDOH) Interventions     Readmission Risk Interventions No flowsheet data found.

## 2021-09-01 NOTE — Progress Notes (Signed)
Physical Therapy Treatment Patient Details Name: Jerry Humphrey MRN: [id number] DOB: [birth year] Today's Date: 09/01/2021   History of Present Illness Pt is a 61 y/o male presenting on 1/22 with L sided weakness, facial droop and dysarthria.  CT head with R subcortical hemorrhage and midline shift. PMH includes: ETOH abuse, HTN, seizures.    PT Comments    Pt making good progress.  Does still have decreased insight into deficits and required mod cues consistently for safety and controlled movements.  Pt overall requiring min A for OOB activity but if fatigued mod A.  Given frequent rest breaks.  Continue plan of care.     Recommendations for follow up therapy are one component of a multi-disciplinary discharge planning process, led by the attending physician.  Recommendations may be updated based on patient status, additional functional criteria and insurance authorization.  Follow Up Recommendations  Skilled nursing-short term rehab (<3 hours/day)     Assistance Recommended at Discharge Frequent or constant Supervision/Assistance  Patient can return home with the following A little help with walking and/or transfers;A little help with bathing/dressing/bathroom;Assistance with cooking/housework;Direct supervision/assist for medications management;Direct supervision/assist for financial management;Assist for transportation;Help with stairs or ramp for entrance   Equipment Recommendations  Rolling walker (2 wheels);Wheelchair cushion (measurements PT);Wheelchair (measurements PT)    Recommendations for Other Services       Precautions / Restrictions Precautions Precautions: Fall Precaution Comments: mildly impulsive with continued mild inattention Restrictions Weight Bearing Restrictions: No     Mobility  Bed Mobility Overal bed mobility: Modified Independent Bed Mobility: Supine to Sit     Supine to sit: Supervision     General bed mobility comments: no physical assist  needed    Transfers Overall transfer level: Needs assistance Equipment used: Rolling walker (2 wheels) Transfers: Sit to/from Stand Sit to Stand: Min guard           General transfer comment: Performed x 6 throughout session; min guard for safety but mod cues to focus on quality of movement including coming up equal on both legs and pushing up with both hands    Ambulation/Gait Ambulation/Gait assistance: Min assist, Mod assist Gait Distance (Feet): 100 Feet (100'x2) Assistive device: Rolling walker (2 wheels) Gait Pattern/deviations: Step-through pattern, Decreased stride length Gait velocity: reduced     General Gait Details: Pt tending to drift L and walks more to L side of RW causing him to occasionally hit L foot on RW back leg.  Multiple cues verbal and tactile and assist to move to middle or even trying to favor R side for improved safety.  Also, cues to focus on control, quality, and safety even if that decreases speed.  Pt reports his job requires him to move fast and he is always fast moving - discused importance of safety and developing good habits then will progress speed. Assist at times to keep L hand in neutral position. Pt overall requiring min A but 1 LOB requiring mod A.  Requiring mod cues.   Stairs Stairs: Yes Stairs assistance: Min assist Stair Management: One rail Right, Two rails, Alternating pattern, Step to pattern, Forwards   General stair comments: Tried 10 steps x 2 with seated rest breaks.  Pt requiring min A and mod cues for safety, controlled speed, and balance.  Pt starting with step to gait and progressing to alternating pattern.  Tends to not use L hand - mod cues to use L hand on rails.   Wheelchair Mobility    Modified  Rankin (Stroke Patients Only) Modified Rankin (Stroke Patients Only) Pre-Morbid Rankin Score: No symptoms Modified Rankin: Moderately severe disability     Balance Overall balance assessment: Needs  assistance Sitting-balance support: No upper extremity supported Sitting balance-Leahy Scale: Good     Standing balance support: Single extremity supported, Bilateral upper extremity supported, No upper extremity supported   Standing balance comment: Pt could static stand without UE support but tends to lean L with fatigue; ambulating with RW; transitioning from RW to stairs with single UE; requiring min guard-mod A depending on fatigue                            Cognition Arousal/Alertness: Awake/alert Behavior During Therapy: WFL for tasks assessed/performed, Impulsive Overall Cognitive Status: Impaired/Different from baseline                         Following Commands: Follows one step commands consistently, Follows multi-step commands inconsistently Safety/Judgement: Decreased awareness of deficits     General Comments: Pt with gradually improving safety awareness and impulsivity.  Does still have some lack of insight into deficits.  Requires mod cues to take his time and focus on safety and quality of movement.        Exercises Other Exercises Other Exercises: Also worked on target tapping L LE in sitting during rest breaks    General Comments General comments (skin integrity, edema, etc.): Pt consistently requiring mod cues and frequent rest breaks      Pertinent Vitals/Pain Pain Assessment Pain Assessment: No/denies pain    Home Living                          Prior Function            PT Goals (current goals can now be found in the care plan section) Progress towards PT goals: Progressing toward goals    Frequency    Min 4X/week      PT Plan Current plan remains appropriate    Co-evaluation              AM-PAC PT "6 Clicks" Mobility   Outcome Measure  Help needed turning from your back to your side while in a flat bed without using bedrails?: None Help needed moving from lying on your back to sitting on the  side of a flat bed without using bedrails?: A Little Help needed moving to and from a bed to a chair (including a wheelchair)?: A Lot Help needed standing up from a chair using your arms (e.g., wheelchair or bedside chair)?: A Lot Help needed to walk in hospital room?: A Lot Help needed climbing 3-5 steps with a railing? : A Lot 6 Click Score: 15    End of Session Equipment Utilized During Treatment: Gait belt Activity Tolerance: Patient tolerated treatment well Patient left: with call bell/phone within reach;with chair alarm set;in chair Nurse Communication: Mobility status PT Visit Diagnosis: Unsteadiness on feet (R26.81);Other abnormalities of gait and mobility (R26.89);Difficulty in walking, not elsewhere classified (R26.2)     Time: [id number] PT Time Calculation (min) (ACUTE ONLY): 25 min  Charges:  $Gait Training: 8-22 mins $Neuromuscular Re-education: 8-22 mins                     Abran Richard, PT Acute Rehab Services Pager [phone] [facility] 09/01/2021, 11:17  AM

## 2021-09-01 NOTE — Progress Notes (Signed)
Occupational Therapy Treatment Patient Details Name: Jerry Humphrey MRN: [id number] DOB: Feb 02, 1961 Today's Date: 09/01/2021   History of present illness Pt is a 61 y/o male presenting on 1/22 with L sided weakness, facial droop and dysarthria.  CT head with R subcortical hemorrhage and midline shift. PMH includes: ETOH abuse, HTN, seizures.   OT comments  Pt making incremental progress with OT goals. He continues to be limited by L sided weakness and inattention.In standing pt leans heavily to the L side and requires min A- Mod A to maintain upright posture. Continues to be motivated to work with therapy. OT will continue to follow acutely.   Recommendations for follow up therapy are one component of a multi-disciplinary discharge planning process, led by the attending physician.  Recommendations may be updated based on patient status, additional functional criteria and insurance authorization.    Follow Up Recommendations  Skilled nursing-short term rehab (<3 hours/day)    Assistance Recommended at Discharge Frequent or constant Supervision/Assistance  Patient can return home with the following  A lot of help with walking and/or transfers;A lot of help with bathing/dressing/bathroom;Assistance with cooking/housework;Assistance with feeding;Direct supervision/assist for medications management;Direct supervision/assist for financial management;Assist for transportation;Help with stairs or ramp for entrance   Equipment Recommendations  Other (comment)    Recommendations for Other Services      Precautions / Restrictions Precautions Precautions: Fall Precaution Comments: mildly impulsive with continued mild inattention Restrictions Weight Bearing Restrictions: No       Mobility Bed Mobility Overal bed mobility: Modified Independent Bed Mobility: Supine to Sit     Supine to sit: Supervision     General bed mobility comments: no physical assist needed    Transfers Overall  transfer level: Needs assistance Equipment used: Rolling walker (2 wheels) Transfers: Sit to/from Stand Sit to Stand: Min assist           General transfer comment: Min A due to L lean and inattention     Balance Overall balance assessment: Needs assistance Sitting-balance support: No upper extremity supported Sitting balance-Leahy Scale: Good Sitting balance - Comments: performs toileting tasks without assistance   Standing balance support: Bilateral upper extremity supported Standing balance-Leahy Scale: Poor Standing balance comment: Pt with L lean throuhgout                           ADL either performed or assessed with clinical judgement   ADL Overall ADL's : Needs assistance/impaired     Grooming: Wash/dry hands;Minimal assistance;Standing Grooming Details (indicate cue type and reason): Assist with standing balance due to L sided weakness                 Toilet Transfer: Minimal assistance;Rolling walker (2 wheels);Ambulation;Regular Glass blower/designer Details (indicate cue type and reason): min A for midline posture, L attention and safety Toileting- Clothing Manipulation and Hygiene: Minimal assistance;Sitting/lateral lean;Sit to/from stand Toileting - Clothing Manipulation Details (indicate cue type and reason): Min A for midline posture     Functional mobility during ADLs: Minimal assistance;Rolling walker (2 wheels);Cueing for sequencing;Cueing for safety General ADL Comments: pt with improved balance this session, benefits from cues for attention and safety    Extremity/Trunk Assessment              Vision       Perception     Praxis      Cognition Arousal/Alertness: Awake/alert Behavior During Therapy: WFL for tasks assessed/performed, Impulsive Overall Cognitive Status:  Impaired/Different from baseline Area of Impairment: Following commands, Safety/judgement, Awareness                       Following Commands:  Follows one step commands consistently, Follows multi-step commands inconsistently Safety/Judgement: Decreased awareness of deficits Awareness: Emergent Problem Solving: Slow processing, Requires verbal cues, Requires tactile cues General Comments: Pt not aware of the fact that he was leaving today or going to rehab. Pt stating that he would be able to rehab himself oonce home.        Exercises      Shoulder Instructions       General Comments Pt consistently requiring mod cues and frequent rest breaks    Pertinent Vitals/ Pain       Pain Assessment Pain Assessment: No/denies pain  Home Living                                          Prior Functioning/Environment              Frequency  Min 2X/week        Progress Toward Goals  OT Goals(current goals can now be found in the care plan section)  Progress towards OT goals: Progressing toward goals  Acute Rehab OT Goals Patient Stated Goal: To go home OT Goal Formulation: With patient Time For Goal Achievement: 09/11/21 Potential to Achieve Goals: Good ADL Goals Pt Will Perform Grooming: with modified independence;standing Pt Will Perform Upper Body Bathing: with supervision;sitting;with set-up Pt Will Perform Upper Body Dressing: with modified independence;sitting Pt Will Perform Lower Body Dressing: with set-up;sit to/from stand Pt Will Transfer to Toilet: with min assist;ambulating;bedside commode Pt/caregiver will Perform Home Exercise Program: Increased ROM;Increased strength;Left upper extremity;With written HEP provided;With theraputty Additional ADL Goal #1: pt will locate 100% of items to his L with compensatory techniques to complete BADLs  Plan Discharge plan remains appropriate    Co-evaluation                 AM-PAC OT "6 Clicks" Daily Activity     Outcome Measure   Help from another person eating meals?: A Little Help from another person taking care of personal  grooming?: A Little Help from another person toileting, which includes using toliet, bedpan, or urinal?: A Little Help from another person bathing (including washing, rinsing, drying)?: A Lot Help from another person to put on and taking off regular upper body clothing?: A Little Help from another person to put on and taking off regular lower body clothing?: A Lot 6 Click Score: 16    End of Session Equipment Utilized During Treatment: Gait belt;Rolling walker (2 wheels)  OT Visit Diagnosis: Other abnormalities of gait and mobility (R26.89);Muscle weakness (generalized) (M62.81);Hemiplegia and hemiparesis;Other symptoms and signs involving cognitive function Hemiplegia - Right/Left: Left Hemiplegia - dominant/non-dominant: Non-Dominant Hemiplegia - caused by: Nontraumatic intracerebral hemorrhage   Activity Tolerance Patient tolerated treatment well   Patient Left in bed;with call bell/phone within reach;with bed alarm set;with nursing/sitter in room   Nurse Communication Mobility status        Time: [id number] OT Time Calculation (min): 16 min  Charges: OT General Charges $OT Visit: 1 Visit OT Treatments $Therapeutic Activity: 8-22 mins  Jazel Nimmons H., OTR/L Acute Rehabilitation  [place] Bradly Elane Yolanda Bonine 09/01/2021, 5:32 PM

## 2021-09-01 NOTE — Plan of Care (Signed)
°  Problem: Education: Goal: Knowledge of General Education information will improve Description: Including pain rating scale, medication(s)/side effects and non-pharmacologic comfort measures Outcome: Adequate for Discharge   Problem: Health Behavior/Discharge Planning: Goal: Ability to manage health-related needs will improve Outcome: Adequate for Discharge   Problem: Activity: Goal: Risk for activity intolerance will decrease Outcome: Adequate for Discharge   Problem: Nutrition: Goal: Adequate nutrition will be maintained Outcome: Adequate for Discharge   Problem: Coping: Goal: Level of anxiety will decrease Outcome: Adequate for Discharge   

## 2021-09-01 NOTE — TOC Transition Note (Addendum)
LATE NOTE SUBMISSION   Transition of Care [facility]) - CM/SW Progression Note   Patient Details  Name: Jerry Humphrey MRN: [id number] Date of Birth: 1961/03/13  [facility] of [facility]) CM/SW Contact:  Baldemar Lenis, LCSW Phone Number: 09/01/2021, 11:00 AM   Clinical Narrative:   CSW attempted to contact [facility] to discuss patient going to SNF now that he's cleared of precautions, left a voicemail. Admissions called back that they are going to need to speak with patient in person and discuss with Administration before agreeing to take him again. Admissions to come and see patient today. CSW to follow.    Final next level of care: Skilled Nursing Facility Barriers to Discharge: Barriers Resolved   Patient Goals and CMS Choice Patient states their goals for this hospitalization and ongoing recovery are:: to get rehab CMS Medicare.gov Compare Post Acute Care list provided to:: Patient Choice offered to / list presented to : Patient  Discharge Placement              Patient chooses bed at:  [facility]) Patient to be transferred to facility by: [facility] Name of family member notified: Mellody Dance Patient and family notified of of transfer: 09/01/21  Discharge Plan and Services     Post Acute Care Choice: Skilled Nursing Facility                               Social Determinants of Health (SDOH) Interventions     Readmission Risk Interventions No flowsheet data found.

## 2021-10-24 ENCOUNTER — Inpatient Hospital Stay: Payer: Self-pay | Admitting: Adult Health

## 2021-10-24 ENCOUNTER — Encounter: Payer: Self-pay | Admitting: Adult Health

## 2021-10-24 NOTE — Progress Notes (Deleted)
?[facility] ?[id number] ?[place]. [place] [id number] ?[phone] ? ?     HOSPITAL FOLLOW UP NOTE ? ?[name] ?Date of Birth:  11-03-1960 ?Medical Record Number:  [id number]  ? ?Reason for Referral:  hospital stroke follow up ? ? ? ?SUBJECTIVE: ? ? ?CHIEF COMPLAINT:  ?No chief complaint on file. ? ? ?HPI:  ? ?[name] is a 61 y.o. male with history of alcohol use, tobacco use, HTN, and alcohol withdrawal seizures who presented on 08/13/2021 with 4 day history of left sided weakness.  Personally reviewed hospitalization pertinent progress notes, lab work and imaging.  Evaluated by Dr. Roda ShuttersXu for right BG subcortical hemorrhage, etiology not quite clear but likely related to HTN and alcohol use per Dr. Roda ShuttersXu.  CTA head/neck negative LVO, atheromatous disease in carotid bifurcation and carotid sinus.  MRI showed right BG hemorrhage estimated 29 mL blood volume with surrounding edema and regional mass effect including 4 mm left midline shift and advanced chronic small vessel disease.  Repeat CT head showed stable appearance.  Recommended repeating MRI and CTA head/neck once hematoma resolves as outpatient.  EF 60 to 65%.  LDL 159.  A1c 5.3.  Initiated Crestor 20 mg daily for HLD management and amlodipine and hydrochlorothiazide for BP control..  No antithrombotic recommended.  Other stroke risk factors include advanced age, tobacco use and EtOH use (alcohol level 170).  Per therapy recommendations, he was discharged to SNF on 2/10. ? ? ? ? ? ? ? ?PERTINENT IMAGING/LABS ? ?Per hospitalization 08/13/2021 ?CT head: Large right intraparenchymal hemorrhage in lentiform nuclei with mass effect on R lateral ventricle with 0.4 cm R to L midline shift. Underlying small vessel white matter disease, Focal encephalomalacia of anterior left frontal lobe   ?CTA head & neck negative for LVO. Atheromatous disease in carotid bifurcation and carotid sinus.  ?MRI  biconvex intra-axial hemorrhage in R  basal ganaglia estimated blood volume 29 mL. Surrounding edema and regional mass effect including 4 mm left midline shift, advanced chronic small vessel disease ?[facility] shows stable R basal ganlia hemorrhage and 4 mm leftward midline shift  ?2D Echo EF 60-65% ?LDL 159 ?HgbA1c 5.3 ? ? ? ? ? ? ?ROS:   ?14 system review of systems performed and negative with exception of *** ? ?PMH:  ?Past Medical History:  ?Diagnosis Date  ? ETOH abuse   ? Hypertension   ? Seizures (HCC)   ? ? ?PSH:  ?Past Surgical History:  ?Procedure Laterality Date  ? OPEN REDUCTION INTERNAL FIXATION (ORIF) TIBIA/FIBULA FRACTURE Right 02/01/2019  ? Procedure: OPEN REDUCTION INTERNAL FIXATION (ORIF) TIBIA/FIBULA FRACTURE;  Surgeon: Beverely LowNorris, Steve, MD;  Location: [facility] ORS;  Service: Orthopedics;  Laterality: Right;  ? ? ?Social History:  ?Social History  ? ?Socioeconomic History  ? Marital status: Single  ?  Spouse name: Not on file  ? Number of children: Not on file  ? Years of education: Not on file  ? Highest education level: Not on file  ?Occupational History  ? Not on file  ?Tobacco Use  ? Smoking status: Every Day  ?  Packs/day: 0.50  ?  Types: Cigarettes  ? Smokeless tobacco: Never  ?Substance and Sexual Activity  ? Alcohol use: Yes  ?  Alcohol/week: 3.0 standard drinks  ?  Types: 3 Cans of beer per week  ?  Comment: 3 64 oz can of beer  ? Drug use: Never  ? Sexual activity:  Not on file  ?Other Topics Concern  ? Not on file  ?Social History Narrative  ? Not on file  ? ?Social Determinants of Health  ? ?Financial Resource Strain: Not on file  ?Food Insecurity: Not on file  ?Transportation Needs: Not on file  ?Physical Activity: Not on file  ?Stress: Not on file  ?Social Connections: Not on file  ?Intimate Partner Violence: Not on file  ? ? ?Family History:  ?Family History  ?Problem Relation Age of Onset  ? Diabetes Neg Hx   ? CAD Neg Hx   ? ? ?Medications:   ?Current Outpatient Medications on File Prior to Visit   ?Medication Sig Dispense Refill  ? amLODipine (NORVASC) 10 MG tablet Take 1 tablet (10 mg total) by mouth daily. 60 tablet 0  ? FLUoxetine (PROZAC) 20 MG capsule Take 1 capsule (20 mg total) by mouth daily.    ? folic acid (FOLVITE) 1 MG tablet Take 1 tablet (1 mg total) by mouth daily. 30 tablet 0  ? hydrochlorothiazide (HYDRODIURIL) 25 MG tablet Take 1 tablet (25 mg total) by mouth daily. 60 tablet 0  ? rosuvastatin (CRESTOR) 20 MG tablet Take 1 tablet (20 mg total) by mouth daily. 30 tablet 0  ? simethicone (MYLICON) 80 MG chewable tablet Chew 1 tablet (80 mg total) by mouth 4 (four) times daily as needed for flatulence.  0  ? thiamine 100 MG tablet Take 1 tablet (100 mg total) by mouth daily. 30 tablet 0  ? ?No current facility-administered medications on file prior to visit.  ? ? ?Allergies:   ?Allergies  ?Allergen Reactions  ? Codeine Rash  ? ? ? ? ?OBJECTIVE: ? ?Physical Exam ? ?There were no vitals filed for this visit. ?There is no height or weight on file to calculate BMI. ?No results found. ? ?   ? View : No data to display.  ?  ?  ?  ?  ? ?General: well developed, well nourished, seated, in no evident distress ?Head: head normocephalic and atraumatic.   ?Neck: supple with no carotid or supraclavicular bruits ?Cardiovascular: regular rate and rhythm, no murmurs ?Musculoskeletal: no deformity ?Skin:  no rash/petichiae ?Vascular:  Normal pulses all extremities ?  ?Neurologic Exam ?Mental Status: Awake and fully alert. Oriented to place and time. Recent and remote memory intact. Attention span, concentration and fund of knowledge appropriate. Mood and affect appropriate.  ?Cranial Nerves: Fundoscopic exam reveals sharp disc margins. Pupils equal, briskly reactive to light. Extraocular movements full without nystagmus. Visual fields full to confrontation. Hearing intact. Facial sensation intact. Face, tongue, palate moves normally and symmetrically.  ?Motor: Normal bulk and tone. Normal strength in all  tested extremity muscles ?Sensory.: intact to touch , pinprick , position and vibratory sensation.  ?Coordination: Rapid alternating movements normal in all extremities. Finger-to-nose and heel-to-shin performed accurately bilaterally. ?Gait and Station: Arises from chair without difficulty. Stance is normal. Gait demonstrates normal stride length and balance with ***. Tandem walk and heel toe ***.  ?Reflexes: 1+ and symmetric. Toes downgoing.  ? ? ? ?NIHSS  *** ?Modified Rankin  *** ? ? ? ? ? ?ASSESSMENT: Jerry Humphrey is a 61 y.o. year old male with right BG subcortical hemorrhage on 08/13/2021, unclear etiology but likely related to HTN and EtOH use. Vascular risk factors include HTN, HLD, advanced age, tobacco use and EtOH use.  ? ? ? ? ?PLAN: ? ?R BG hemorrhage:  ?Residual deficit: ***.  ?Repeat MR brain w/wo contrast and CTA  head/neck to look for underlying causes of recent hemorrhage.   ?Continue Crestor 20 mg daily for secondary stroke prevention.   ?No indication for antithrombotic as no prior stroke history or cardiovascular disease ?Discussed secondary stroke prevention measures and importance of close PCP follow up for aggressive stroke risk factor management including BP goal<130/90 and HLD with LDL goal<70.  ?I have gone over the pathophysiology of stroke, warning signs and symptoms, risk factors and their management in some detail with instructions to go to the closest emergency room for symptoms of concern. ?Stroke labs 07/2021: LDL 159, A1c 5.3 ?*** ? ? ? ? ?Follow up in *** or call earlier if needed ? ? ?CC:  ?[facility] provider: Dr. Pearlean Brownie ?PCP: Pcp, No   ? ?I spent *** minutes of face-to-face and non-face-to-face time with patient.  This included previsit chart review including review of recent hospitalization, lab review, study review, order entry, electronic health record documentation, patient education regarding recent stroke including etiology, secondary stroke prevention measures and importance  of managing stroke risk factors, residual deficits and typical recovery time and answered all other questions to patient satisfaction ? ? ?Ihor Austin, AGNP-BC ? ?[facility] ?[address]

## 2021-11-09 ENCOUNTER — Other Ambulatory Visit: Payer: Self-pay

## 2021-11-09 NOTE — Patient Outreach (Signed)
[facility] Customer service manager [facility]) Care Management ? ?11/09/2021 ? ?Jerry Humphrey ?22-Dec-1960 ?[id number] ? ? ?First telephone outreach attempt to obtain mRS. No answer. Left message for returned call. ? ?[name] ?[facility]-Care Management Assistant ?[phone] ? ?

## 2021-11-24 ENCOUNTER — Emergency Department (HOSPITAL_COMMUNITY)
Admission: EM | Admit: 2021-11-24 | Discharge: 2021-11-25 | Disposition: A | Discharge status: Home / Self Care | Payer: Medicaid Other | Attending: Emergency Medicine | Admitting: Emergency Medicine

## 2021-11-24 ENCOUNTER — Emergency Department (HOSPITAL_COMMUNITY): Payer: Medicaid Other

## 2021-11-24 ENCOUNTER — Other Ambulatory Visit: Payer: Self-pay

## 2021-11-24 DIAGNOSIS — R200 Anesthesia of skin: Secondary | ICD-10-CM | POA: Diagnosis not present

## 2021-11-24 DIAGNOSIS — Z20822 Contact with and (suspected) exposure to covid-19: Secondary | ICD-10-CM | POA: Diagnosis not present

## 2021-11-24 DIAGNOSIS — R42 Dizziness and giddiness: Secondary | ICD-10-CM | POA: Insufficient documentation

## 2021-11-24 LAB — DIFFERENTIAL
Abs Immature Granulocytes: 0.01 10*3/uL (ref 0.00–0.07)
Basophils Absolute: 0.1 10*3/uL (ref 0.0–0.1)
Basophils Relative: 1 %
Eosinophils Absolute: 0.2 10*3/uL (ref 0.0–0.5)
Eosinophils Relative: 3 %
Immature Granulocytes: 0 %
Lymphocytes Relative: 43 %
Lymphs Abs: 3.4 10*3/uL (ref 0.7–4.0)
Monocytes Absolute: 0.7 10*3/uL (ref 0.1–1.0)
Monocytes Relative: 9 %
Neutro Abs: 3.5 10*3/uL (ref 1.7–7.7)
Neutrophils Relative %: 44 %

## 2021-11-24 LAB — URINALYSIS, ROUTINE W REFLEX MICROSCOPIC
Bacteria, UA: NONE SEEN
Bilirubin Urine: NEGATIVE
Glucose, UA: NEGATIVE mg/dL
Hgb urine dipstick: NEGATIVE
Ketones, ur: NEGATIVE mg/dL
Leukocytes,Ua: NEGATIVE
Nitrite: NEGATIVE
Protein, ur: NEGATIVE mg/dL
Specific Gravity, Urine: 1.021 (ref 1.005–1.030)
pH: 5 (ref 5.0–8.0)

## 2021-11-24 LAB — RAPID URINE DRUG SCREEN, HOSP PERFORMED
Amphetamines: NOT DETECTED
Barbiturates: NOT DETECTED
Benzodiazepines: NOT DETECTED
Cocaine: NOT DETECTED
Opiates: NOT DETECTED
Tetrahydrocannabinol: POSITIVE — AB

## 2021-11-24 LAB — TROPONIN I (HIGH SENSITIVITY): Troponin I (High Sensitivity): 3 ng/L (ref <18)

## 2021-11-24 LAB — RESP PANEL BY RT-PCR (FLU A&B, COVID) ARPGX2
Influenza A by PCR: NEGATIVE
Influenza B by PCR: NEGATIVE
SARS Coronavirus 2 by RT PCR: NEGATIVE

## 2021-11-24 LAB — CBC
HCT: 41.3 % (ref 39.0–52.0)
Hemoglobin: 14.1 g/dL (ref 13.0–17.0)
MCH: 29.6 pg (ref 26.0–34.0)
MCHC: 34.1 g/dL (ref 30.0–36.0)
MCV: 86.8 fL (ref 80.0–100.0)
Platelets: 265 10*3/uL (ref 150–400)
RBC: 4.76 MIL/uL (ref 4.22–5.81)
RDW: 13.4 % (ref 11.5–15.5)
WBC: 7.8 10*3/uL (ref 4.0–10.5)
nRBC: 0 % (ref 0.0–0.2)

## 2021-11-24 LAB — ETHANOL: Alcohol, Ethyl (B): <10 mg/dL (ref <10)

## 2021-11-24 IMAGING — CT CT HEAD W/O CM
4 series · 16 of 47 positions shown, 18 images · non-contrast
Comparison: MRI brain and head CT both [DATE].

CLINICAL DATA: Neuro deficit, acute, stroke suspected. Dizziness
and left-sided numbness.



[Series 3: head wo · axial · 0.41mm/px · z∈[-119,+16]mm · 7 of 37 slices shown, 9 images]
[im 5/37  brain]
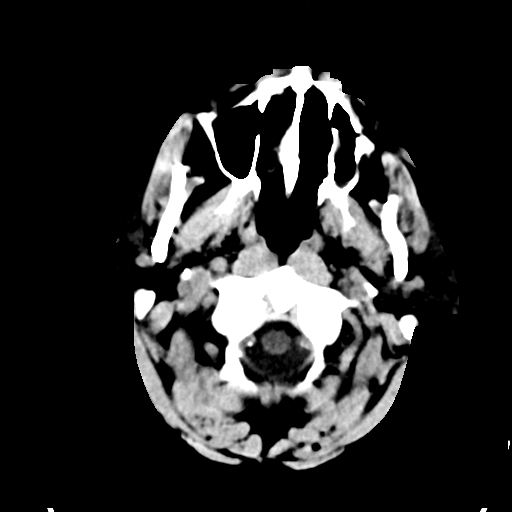
[im 5/37  bone]
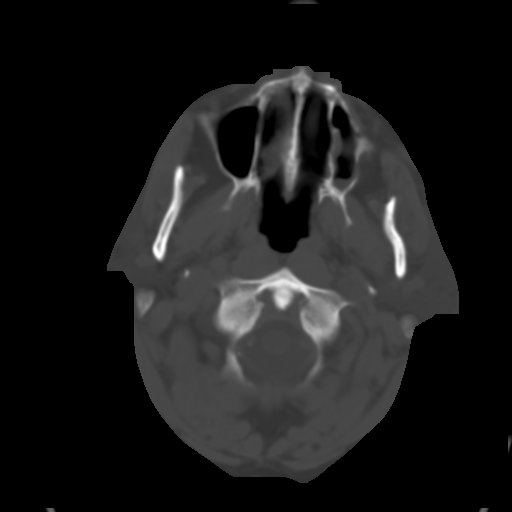
[im 10/37  brain]
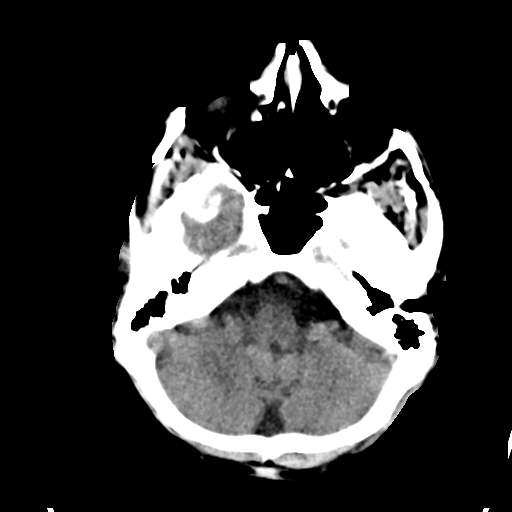
[im 14/37  brain]
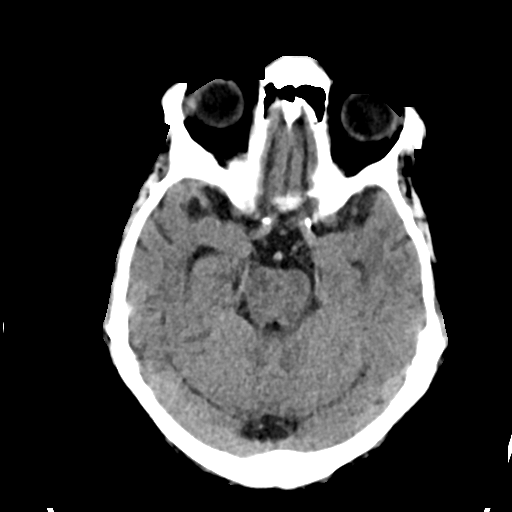
[im 19/37  brain]
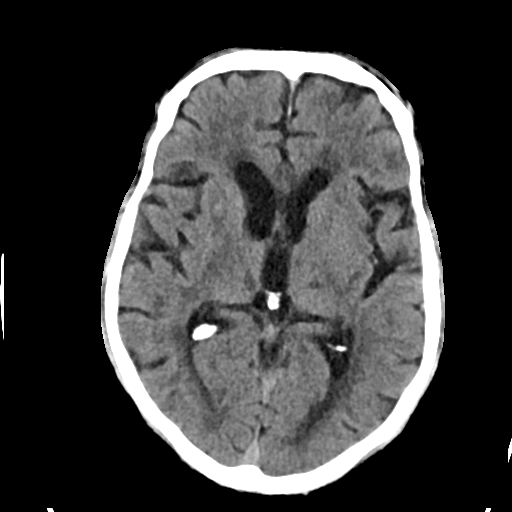
[im 23/37  brain]
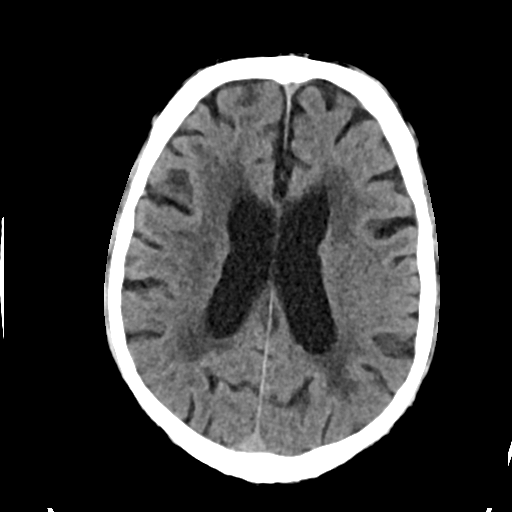
[im 23/37  bone]
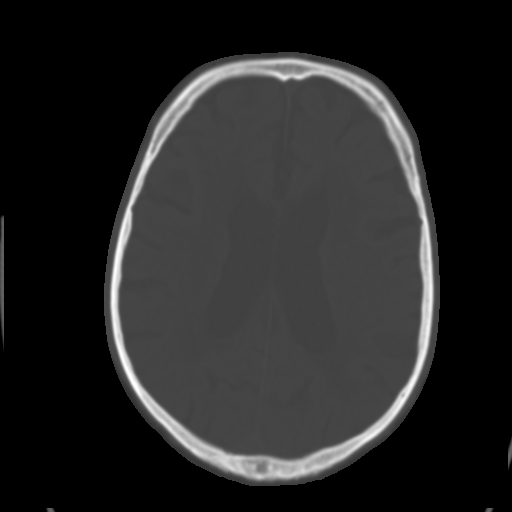
[im 28/37  brain]
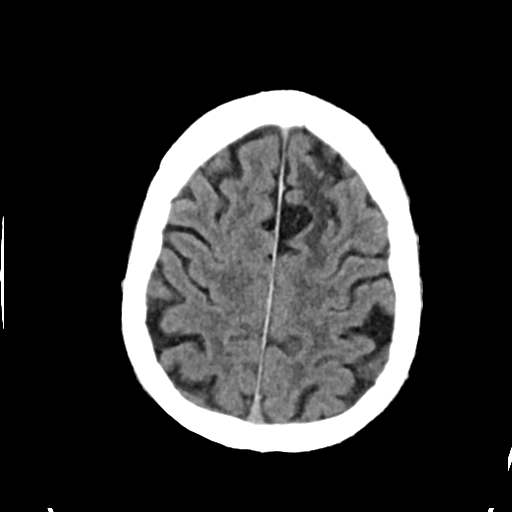
[im 32/37  brain]
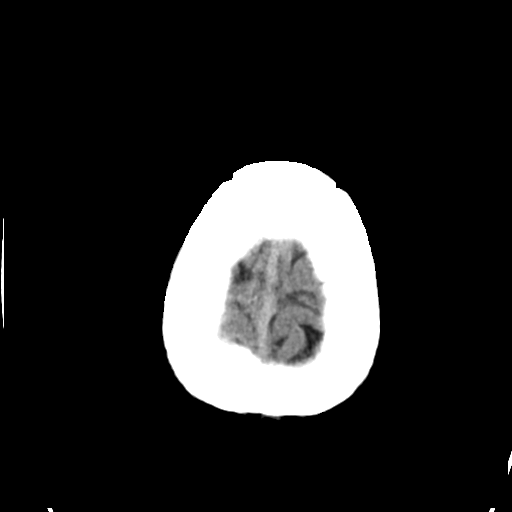

[Series 4: head bone · axial · 0.41mm/px · z∈[-121,-85]mm · 3 of 92 slices shown]
[im 10/92  bone]
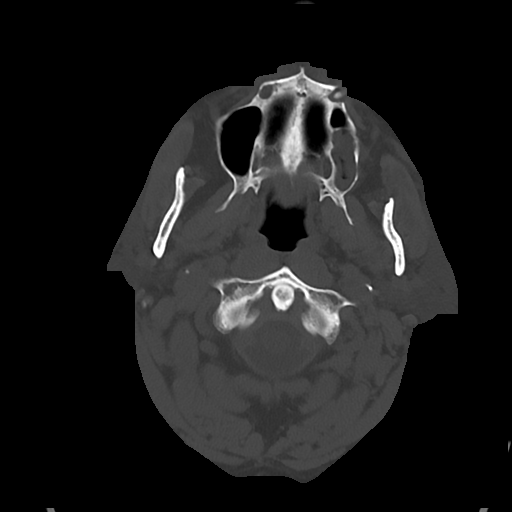
[im 19/92  bone]
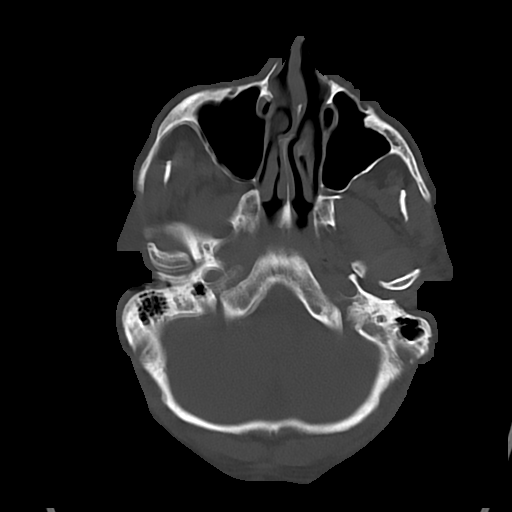
[im 28/92  bone]
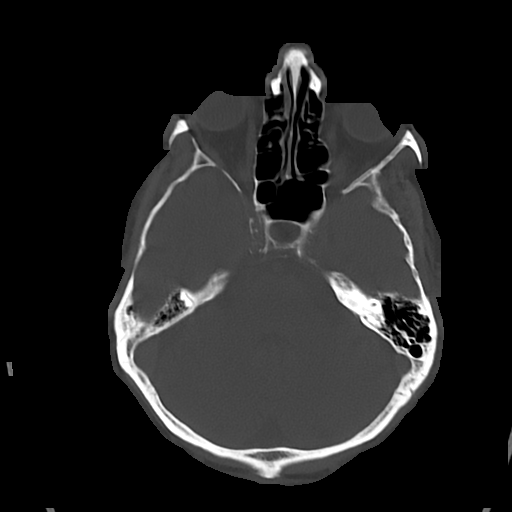

[Series 5: cor soft · coronal · 0.35mm/px · 3 of 72 slices shown]
[im 24/72  brain]
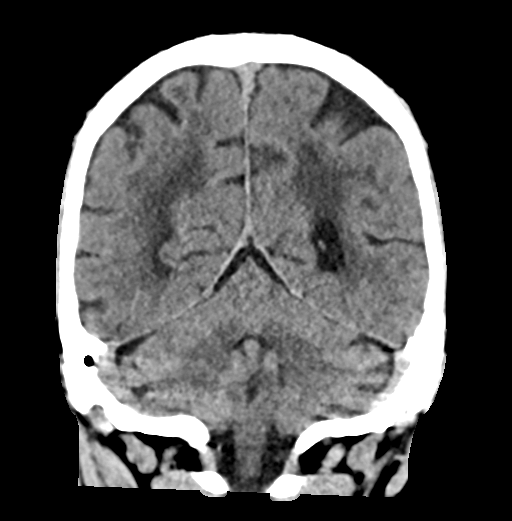
[im 32/72  brain]
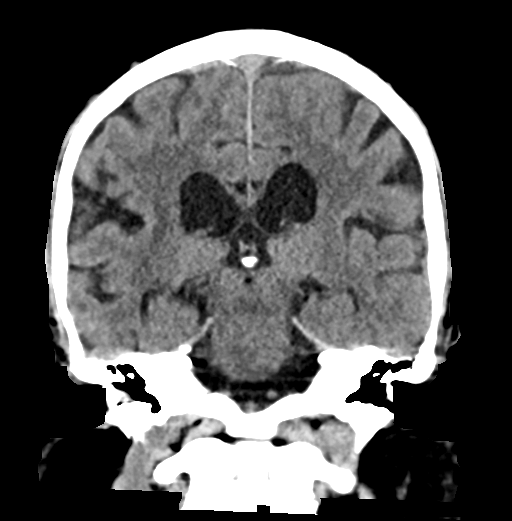
[im 40/72  brain]
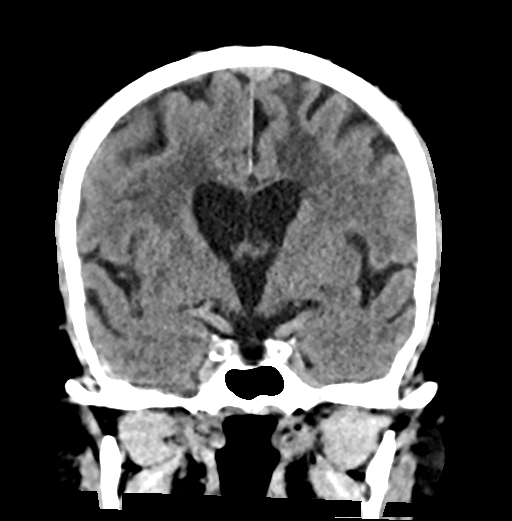

[Series 6: sag soft · sagittal · 0.36mm/px · 3 of 61 slices shown]
[im 21/61  brain]
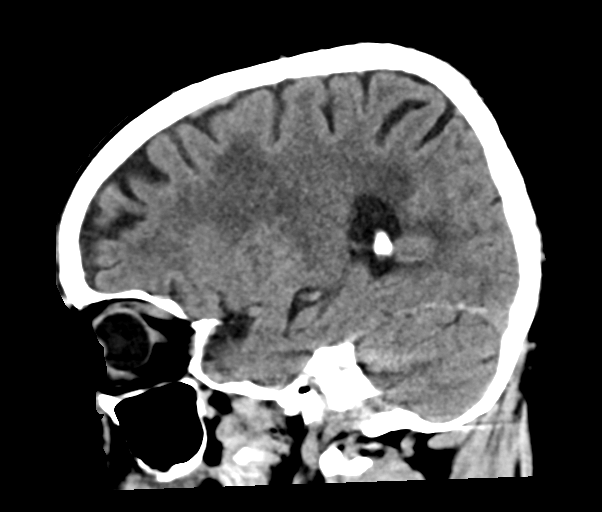
[im 31/61  brain]
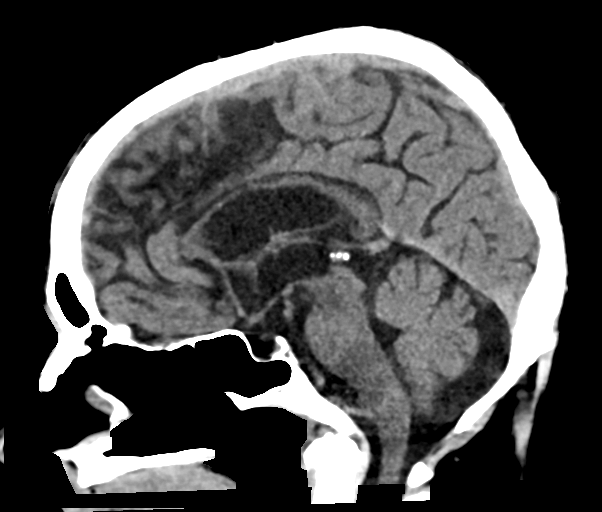
[im 41/61  brain]
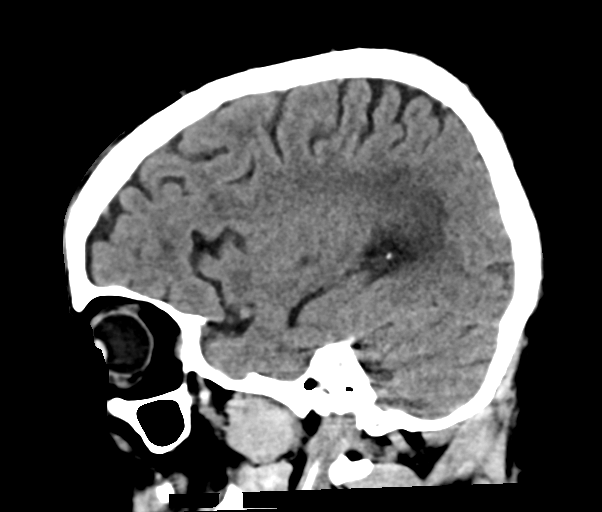

[16 of 47 positions shown; findings below may reference images not displayed]

FINDINGS: Brain: Interval resolution of prior 4.7 x 2.3 cm right basal
lentiform nucleus hematoma with small residual linear lacunar
infarcts in the internal capsule.

Interval resolution of prior midline shift and area mass effect.
There is mild-to-moderate cerebral atrophy, advanced small vessel
disease for age and moderate atrophic ventriculomegaly.

Again noted are a chronic parasagittal anterior superior left
frontal lobe cortical infarct as well as chronic lacunar infarcts in
both thalami and external capsules, right cerebellar hemisphere.

No new asymmetry is seen concerning for an acute cortical infarct,
hemorrhage or mass.

There is no midline shift. Basal cisterns are clear. Slight
cerebellar atrophy.

Vascular: There is patchy calcification of both distal vertebral
arteries and siphons but no hyperdense central vessels.

Skull: No fracture or focal lesion.

Sinuses/Orbits: Unremarkable orbits, orbital contents. There is
membrane disease in the floor of the left maxillary sinus. Other
visualized sinuses, mastoid air cells are clear.

Other: Empty sella is again noted.
IMPRESSION: 1. No acute intracranial CT findings.
2. Resolution of the prior right lentiform nucleus hematoma and mass
effect.
3. Linear lacunar infarcts right internal capsule are now noted as
well as additional chronic changes.
4. Empty sella.

## 2021-11-24 MED ORDER — AMLODIPINE BESYLATE 10 MG PO TABS
10.0000 mg | ORAL_TABLET | Freq: Every day | ORAL | 0 refills | Status: DC
  Filled 2021-11-24: qty 30, 30d supply, fill #0

## 2021-11-24 MED ORDER — HYDROCHLOROTHIAZIDE 25 MG PO TABS
25.0000 mg | ORAL_TABLET | Freq: Every day | ORAL | 0 refills | Status: DC
  Filled 2021-11-24: qty 30, 30d supply, fill #0

## 2021-11-24 MED ORDER — FLUOXETINE HCL 20 MG PO CAPS
20.0000 mg | ORAL_CAPSULE | Freq: Every day | ORAL | 0 refills | Status: DC
  Filled 2021-11-24: qty 30, 30d supply, fill #0

## 2021-11-24 MED ORDER — ROSUVASTATIN CALCIUM 20 MG PO TABS
20.0000 mg | ORAL_TABLET | Freq: Every day | ORAL | 0 refills | Status: DC
Start: 2021-11-24 — End: 2023-06-01
  Filled 2021-11-24: qty 30, 30d supply, fill #0

## 2021-11-24 NOTE — ED Provider Notes (Signed)
?[facility] EMERGENCY DEPARTMENT ?Provider Note ? ? ?CSN: [id number] ?Arrival date & time: 11/24/21  2051 ? ?  ? ?History ? ?Chief Complaint  ?Patient presents with  ? Dizziness  ? Tingling  ? ? ?Jerry Humphrey is a 61 y.o. male. ? ?Patient is a poor historian.  He has a history of hemorrhagic stroke in January 2023.  States he has had left-sided numbness ongoing since then.  He comes in today after episode of dizziness while walking in the neighborhood for several hours.  He states while walking around the neighborhood he began to have increased dizziness described as both lightheadedness and room spinning.  Contrary to triage note he states his left-sided numbness is at baseline and no worse than usual.  He states he has not had his blood pressure medications for several weeks. ?He denies any focal weakness.  He denies any difficulty speaking or difficulty swallowing.  Denies any chest pain or shortness of breath.  Denies any cough or fever. ?He states he has had ongoing left-sided numbness ever since his stroke which is unchanged today.  States the dizziness has been ongoing for several days and is not a clear historian.  He first stated the dizziness started several hours ago today but now states it has been there for several days. ?Reports not having any of his medications for several months. ?Denies any alcohol or drug use. ? ?The history is provided by the patient and the EMS personnel. The history is limited by the condition of the patient.  ?Dizziness ?Associated symptoms: no chest pain, no headaches, no nausea, no shortness of breath and no vomiting   ? ?  ? ?Home Medications ?Prior to Admission medications   ?Medication Sig Start Date End Date Taking? Authorizing Provider  ?amLODipine (NORVASC) 10 MG tablet Take 1 tablet (10 mg total) by mouth daily. 08/26/21   Alfredo Martinez, MD  ?FLUoxetine (PROZAC) 20 MG capsule Take 1 capsule (20 mg total) by mouth daily. 09/02/21   Levin Erp, MD   ?folic acid (FOLVITE) 1 MG tablet Take 1 tablet (1 mg total) by mouth daily. 08/26/21   Alfredo Martinez, MD  ?hydrochlorothiazide (HYDRODIURIL) 25 MG tablet Take 1 tablet (25 mg total) by mouth daily. 08/26/21   Alfredo Martinez, MD  ?rosuvastatin (CRESTOR) 20 MG tablet Take 1 tablet (20 mg total) by mouth daily. 08/26/21   Alfredo Martinez, MD  ?simethicone (MYLICON) 80 MG chewable tablet Chew 1 tablet (80 mg total) by mouth 4 (four) times daily as needed for flatulence. 09/01/21   Levin Erp, MD  ?thiamine 100 MG tablet Take 1 tablet (100 mg total) by mouth daily. 08/26/21   Alfredo Martinez, MD  ?   ? ?Allergies    ?Codeine   ? ?Review of Systems   ?Review of Systems  ?Constitutional:  Negative for activity change, appetite change and fever.  ?HENT:  Negative for congestion and rhinorrhea.   ?Respiratory:  Negative for cough, chest tightness and shortness of breath.   ?Cardiovascular:  Negative for chest pain and leg swelling.  ?Gastrointestinal:  Negative for abdominal pain, nausea and vomiting.  ?Genitourinary:  Negative for dysuria and hematuria.  ?Musculoskeletal:  Negative for arthralgias and myalgias.  ?Skin:  Negative for rash.  ?Neurological:  Positive for dizziness and numbness. Negative for headaches.  ? all other systems are negative except as noted in the HPI and PMH.  ? ?Physical Exam ?Updated Vital Signs ?BP (!) 154/94 (BP Location: Left Arm)  Pulse 79   Temp 98.3 ?F (36.8 ?C) (Oral)   Resp 15   SpO2 95%  ?Physical Exam ?Vitals and nursing note reviewed.  ?Constitutional:   ?   General: He is not in acute distress. ?   Appearance: He is well-developed.  ?HENT:  ?   Head: Normocephalic and atraumatic.  ?   Mouth/Throat:  ?   Pharynx: No oropharyngeal exudate.  ?Eyes:  ?   Conjunctiva/sclera: Conjunctivae normal.  ?   Pupils: Pupils are equal, round, and reactive to light.  ?Neck:  ?   Comments: No meningismus. ?Cardiovascular:  ?   Rate and Rhythm: Normal rate and regular rhythm.  ?   Heart sounds:  Normal heart sounds. No murmur heard. ?Pulmonary:  ?   Effort: Pulmonary effort is normal. No respiratory distress.  ?   Breath sounds: Normal breath sounds.  ?Abdominal:  ?   Palpations: Abdomen is soft.  ?   Tenderness: There is no abdominal tenderness. There is no guarding or rebound.  ?Musculoskeletal:     ?   General: No tenderness. Normal range of motion.  ?   Cervical back: Normal range of motion and neck supple.  ?Skin: ?   General: Skin is warm.  ?Neurological:  ?   Mental Status: He is alert and oriented to person, place, and time.  ?   Cranial Nerves: No cranial nerve deficit.  ?   Motor: No abnormal muscle tone.  ?   Coordination: Coordination normal.  ?   Comments:  ?Questionably slurred speech.  Tongue is midline, no facial droop, 5/5 strength bilaterally.  Able to hold arms and legs off the bed bilaterally.  No pronator drift. ? ?Decrease sensation to left arm and left leg  ?Psychiatric:     ?   Behavior: Behavior normal.  ? ? ?ED Results / Procedures / Treatments   ?Labs ?(all labs ordered are listed, but only abnormal results are displayed) ?Labs Reviewed  ?RAPID URINE DRUG SCREEN, HOSP PERFORMED - Abnormal; Notable for the following components:  ?    Result Value  ? Tetrahydrocannabinol POSITIVE (*)   ? All other components within normal limits  ?RESP PANEL BY RT-PCR (FLU A&B, COVID) ARPGX2  ?ETHANOL  ?CBC  ?DIFFERENTIAL  ?URINALYSIS, ROUTINE W REFLEX MICROSCOPIC  ?COMPREHENSIVE METABOLIC PANEL  ?I-STAT CHEM 8, ED  ?TROPONIN I (HIGH SENSITIVITY)  ?TROPONIN I (HIGH SENSITIVITY)  ? ? ?EKG ?EKG Interpretation ? ?Date/Time:  Friday Nov 24 2021 21:14:37 EDT ?Ventricular Rate:  75 ?PR Interval:  155 ?QRS Duration: 93 ?QT Interval:  385 ?QTC Calculation: 430 ?R Axis:   34 ?Text Interpretation: Sinus rhythm No significant change was found Confirmed by Glynn Octave [phone]) on 11/24/2021 9:47:00 PM ? ?Radiology ?CT Head Wo Contrast ? ?Result Date: 11/25/2021 ?CLINICAL DATA:  Neuro deficit, acute, stroke  suspected. Dizziness and left-sided numbness. EXAM: CT HEAD WITHOUT CONTRAST TECHNIQUE: Contiguous axial images were obtained from the base of the skull through the vertex without intravenous contrast. RADIATION DOSE REDUCTION: This exam was performed according to the departmental dose-optimization program which includes automated exposure control, adjustment of the mA and/or kV according to patient size and/or use of iterative reconstruction technique. COMPARISON:  MRI brain and head CT both 08/14/2021. FINDINGS: Brain: Interval resolution of prior 4.7 x 2.3 cm right basal lentiform nucleus hematoma with small residual linear lacunar infarcts in the internal capsule. Interval resolution of prior midline shift and area mass effect. There is mild-to-moderate cerebral atrophy, advanced small vessel  disease for age and moderate atrophic ventriculomegaly. Again noted are a chronic parasagittal anterior superior left frontal lobe cortical infarct as well as chronic lacunar infarcts in both thalami and external capsules, right cerebellar hemisphere. No new asymmetry is seen concerning for an acute cortical infarct, hemorrhage or mass. There is no midline shift. Basal cisterns are clear. Slight cerebellar atrophy. Vascular: There is patchy calcification of both distal vertebral arteries and siphons but no hyperdense central vessels. Skull: No fracture or focal lesion. Sinuses/Orbits: Unremarkable orbits, orbital contents. There is membrane disease in the floor of the left maxillary sinus. Other visualized sinuses, mastoid air cells are clear. Other: Empty sella is again noted. IMPRESSION: 1. No acute intracranial CT findings. 2. Resolution of the prior right lentiform nucleus hematoma and mass effect. 3. Linear lacunar infarcts right internal capsule are now noted as well as additional chronic changes. 4. Empty sella. Electronically Signed   By: Almira BarKeith  Chesser M.D.   On: 11/25/2021 00:19   ? ?Procedures ?Procedures   ? ? ?Medications Ordered in ED ?Medications - No data to display ? ?ED Course/ Medical Decision Making/ A&P ?  ?                        ?Medical Decision Making ?Amount and/or Complexity of Data Reviewed ?Labs: ordered

## 2021-11-24 NOTE — Patient Outreach (Signed)
[facility] Customer service manager [facility]) Care Management ? ?11/24/2021 ? ?Jerry Humphrey ?21-Nov-1960 ?[phone] ? ? ?Second telephone outreach attempt to obtain mRS. No answer. Left message for returned call. ? ?[name] ?[facility]-Care Management Assistant ?[phone] ? ?

## 2021-11-24 NOTE — ED Triage Notes (Signed)
Pt bib EMS complaining of dizziness + increased left sided numbness and tingling for 2-3 hours following long walk around the neighborhood. Hx stroke in Jan, was in rehab until March, L sided weakness + tingling at baseline. Pt stated he has not been taking meds for past few weeks due to having complications with switching doctors. BP 170/100, CBG 256, HR 85 ?

## 2021-11-24 NOTE — Discharge Instructions (Signed)
Take your medications as prescribed and follow-up with your doctor.  Return to the ED with new or worsening symptoms. 

## 2021-11-25 ENCOUNTER — Emergency Department (HOSPITAL_COMMUNITY): Payer: Medicaid Other

## 2021-11-25 ENCOUNTER — Telehealth: Payer: Self-pay

## 2021-11-25 LAB — COMPREHENSIVE METABOLIC PANEL
ALT: 44 U/L (ref 0–44)
AST: 40 U/L (ref 15–41)
Albumin: 3.5 g/dL (ref 3.5–5.0)
Alkaline Phosphatase: 56 U/L (ref 38–126)
Anion gap: 8 (ref 5–15)
BUN: 12 mg/dL (ref 8–23)
CO2: 24 mmol/L (ref 22–32)
Calcium: 9.4 mg/dL (ref 8.9–10.3)
Chloride: 106 mmol/L (ref 98–111)
Creatinine, Ser: 1.03 mg/dL (ref 0.61–1.24)
GFR, Estimated: >60 mL/min (ref >60)
Glucose, Bld: 90 mg/dL (ref 70–99)
Potassium: 4.1 mmol/L (ref 3.5–5.1)
Sodium: 138 mmol/L (ref 135–145)
Total Bilirubin: 0.6 mg/dL (ref 0.3–1.2)
Total Protein: 7 g/dL (ref 6.5–8.1)

## 2021-11-25 LAB — TROPONIN I (HIGH SENSITIVITY): Troponin I (High Sensitivity): 4 ng/L (ref <18)

## 2021-11-25 IMAGING — MR MR HEAD W/O CM
12 of 13 series · 44 of 48 positions shown · non-contrast
Comparison: [DATE]

CLINICAL DATA: Acute neurologic deficit

EXAM:
MRI HEAD WITHOUT CONTRAST
TECHNIQUE: Multiplanar, multiecho pulse sequences of the brain and surrounding
structures were obtained without intravenous contrast.

[Series 5: DWI · axial · 3.0mm · 0.88mm/px · z∈[-132,+8]mm · 8 of 96 slices shown (1 of 4)]
[im 1/96]
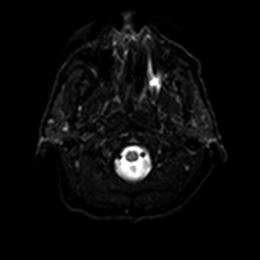
[im 14/96]
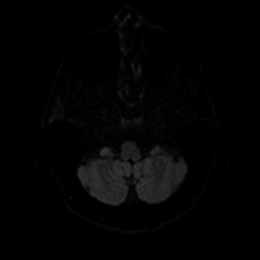
[im 28/96]
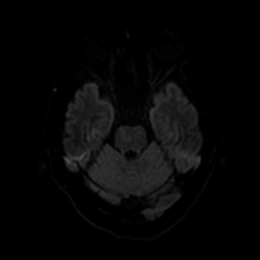
[im 41/96]
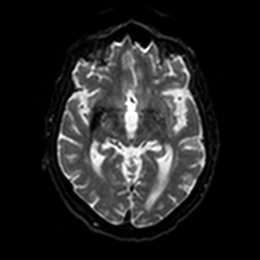
[im 55/96]
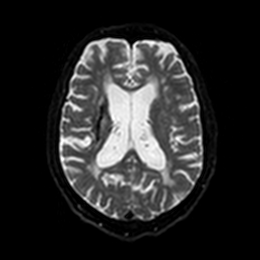
[im 68/96]
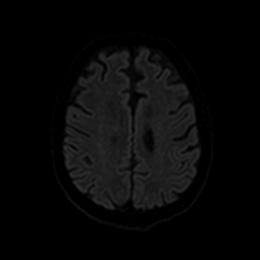
[im 82/96]
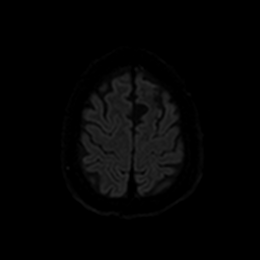
[im 96/96]
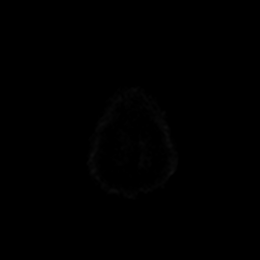

[Series 6: DWI · axial · 3.0mm · 0.88mm/px · z∈[-132,+8]mm · 4 of 48 slices shown (2 of 4)]
[im 1/48]
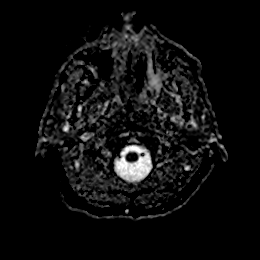
[im 16/48]
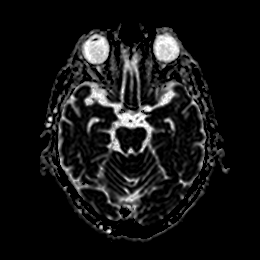
[im 32/48]
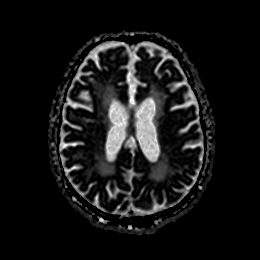
[im 48/48]
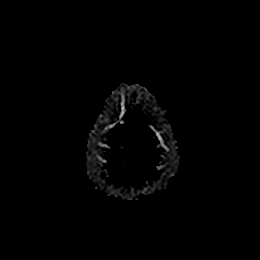

[Series 7: DWI · coronal · 4.0mm · 0.88mm/px · 5 of 66 slices shown (3 of 4)]
[im 1/66]
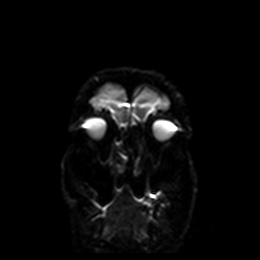
[im 17/66]
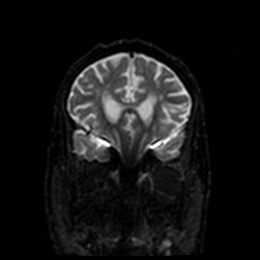
[im 33/66]
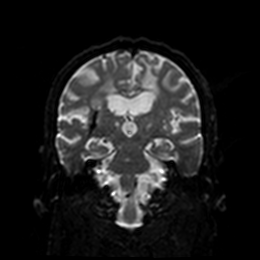
[im 49/66]
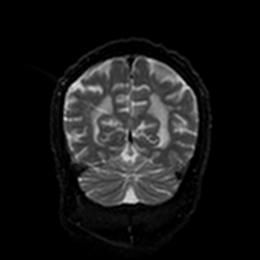
[im 66/66]
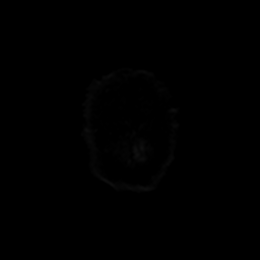

[Series 8: DWI · coronal · 4.0mm · 0.88mm/px · 3 of 33 slices shown (4 of 4)]
[im 1/33]
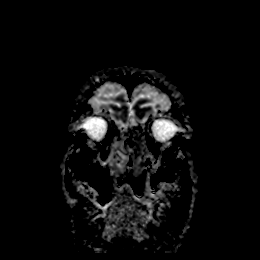
[im 17/33]
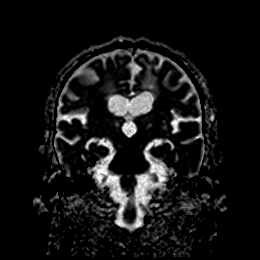
[im 33/33]
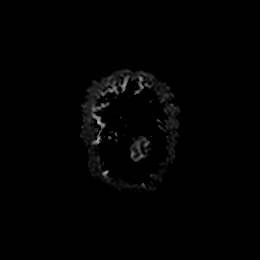

[Series 9: T1 · sagittal · 5.0mm · 0.75mm/px · 2 of 25 slices shown]
[im 1/25]
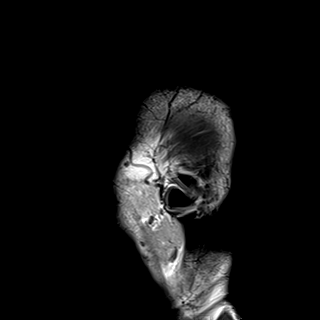
[im 25/25]
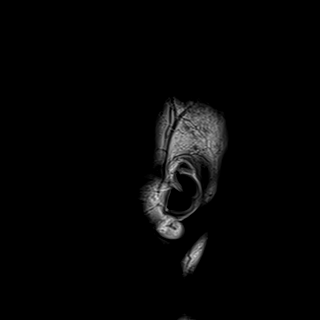

[Series 10: T2 · axial · 5.0mm · 0.72mm/px · z∈[-142,+14]mm · 2 of 27 slices shown (1 of 2)]
[im 1/27]
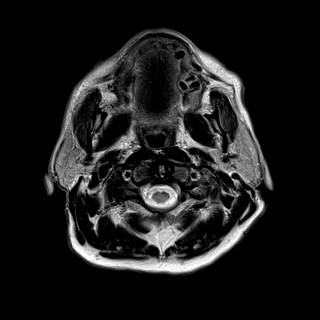
[im 27/27]
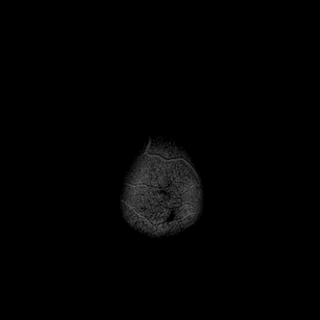

[Series 11: FLAIR · axial · 5.0mm · 0.45mm/px · z∈[-140,+15]mm · 2 of 27 slices shown]
[im 1/27]
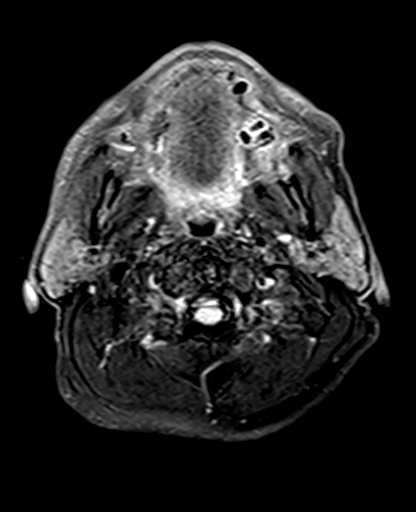
[im 27/27]
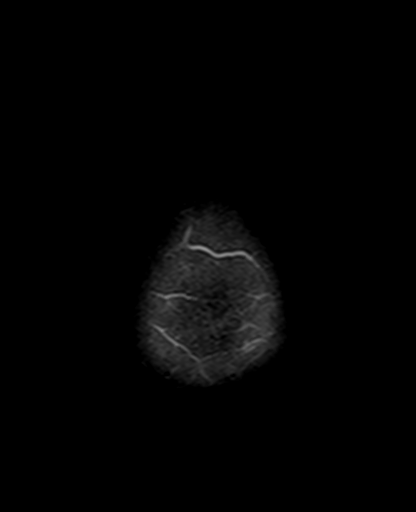

[Series 12: mag_images · axial · 3.0mm · 0.90mm/px · z∈[-145,+19]mm · 4 of 56 slices shown]
[im 1/56]
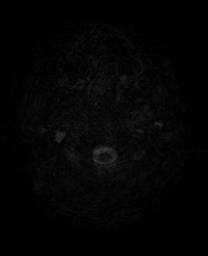
[im 19/56]
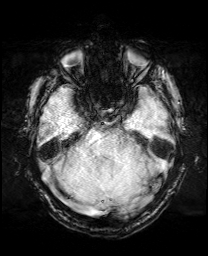
[im 37/56]
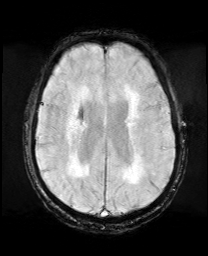
[im 56/56]
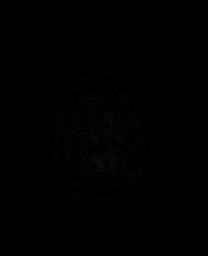

[Series 13: pha_images · axial · 3.0mm · 0.90mm/px · z∈[-145,+19]mm · 4 of 56 slices shown]
[im 1/56]
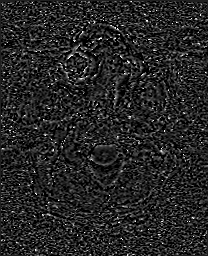
[im 19/56]
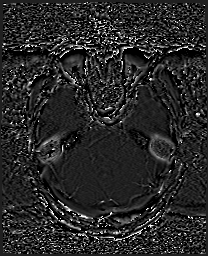
[im 37/56]
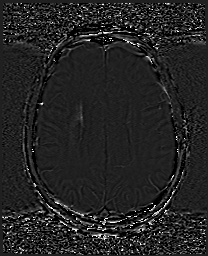
[im 56/56]
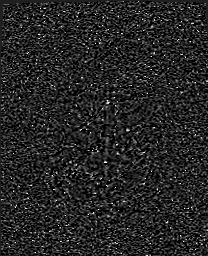

[Series 14: swi_images · axial · 3.0mm · 0.90mm/px · z∈[-145,+19]mm · 4 of 56 slices shown]
[im 1/56]
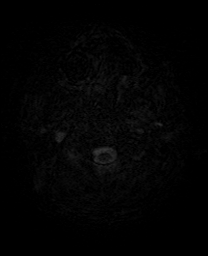
[im 19/56]
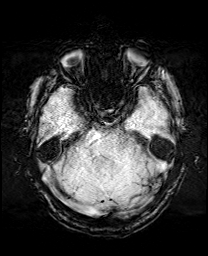
[im 37/56]
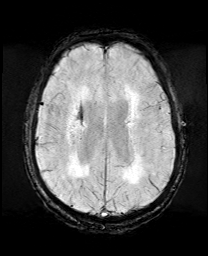
[im 56/56]
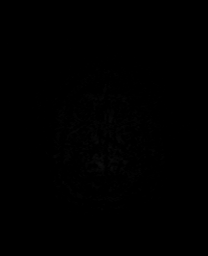

[Series 15: mip_images(sw) · axial · 24.0mm · 0.90mm/px · z∈[-134,+9]mm · 4 of 49 slices shown]
[im 1/49]
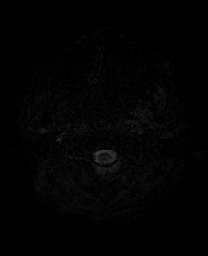
[im 17/49]
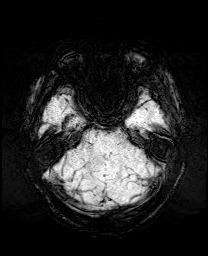
[im 33/49]
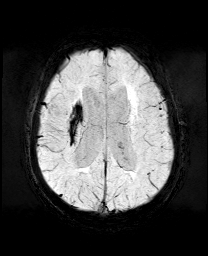
[im 49/49]
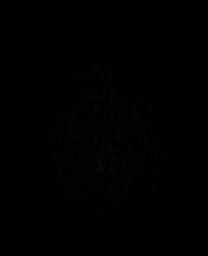

[Series 17: T2 · coronal · 5.0mm · 0.34mm/px · 2 of 29 slices shown (2 of 2)]
[im 1/29]
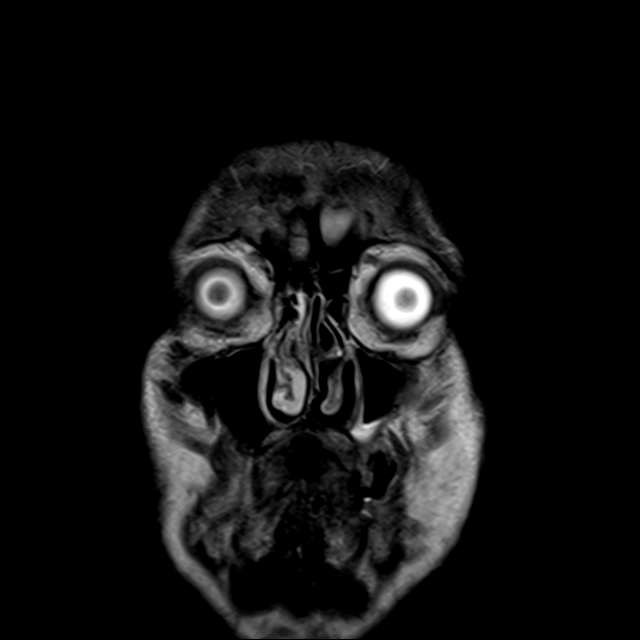
[im 29/29]
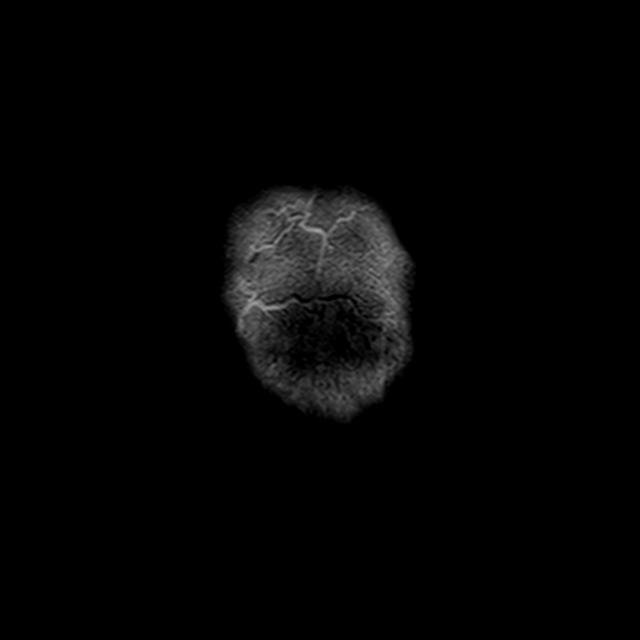

[44 of 48 positions shown; findings below may reference images not displayed]

FINDINGS: Brain: Sequelae of right capsular intraparenchymal hematoma. No
acute infarct. There is multifocal hyperintense T2-weighted signal
within the white matter. Generalized cerebral volume loss. Old left
frontal infarct. The midline structures are normal. Multiple old
small vessel infarcts.

Vascular: Major flow voids are preserved.

Skull and upper cervical spine: Normal calvarium and skull base.
Visualized upper cervical spine and soft tissues are normal.

Sinuses/Orbits:No paranasal sinus fluid levels or advanced mucosal
thickening. No mastoid or middle ear effusion. Normal orbits.
IMPRESSION: 1. No acute intracranial abnormality.
2. Sequelae of right capsular intraparenchymal hematoma.

## 2021-11-25 NOTE — ED Provider Notes (Signed)
Patient care assumed at midnight.  Patient with history of CVA in January here for evaluation of worsening of his chronic paresthesias to his left side as well as dizziness on ambulation.  MRI and bmet pending at time of signout. ? ?MRI is negative for acute abnormality.  BMP without acute abnormality.  Patient is feeling improved at time of assessment.  Plan to discharge home with outpatient resources and return precautions. ?  ?Tilden Fossa, MD ?11/25/21 0425 ? ?

## 2021-11-25 NOTE — Telephone Encounter (Signed)
Patients brother called to state that his prescriptions were sent  to [facility] pharmacy which is closed the weekend. He needs his medication, switched per request to [facility] at [address] as ordered and prescribed. By MD. Rhina Brackett message to [facility] pharmacy to cancel  ?

## 2021-11-25 NOTE — ED Notes (Signed)
This RN called Elsa Wedell [phone] x2 per pt request for ride home. No answer. Will retry again at later time.  ?

## 2021-11-27 ENCOUNTER — Other Ambulatory Visit (HOSPITAL_COMMUNITY): Payer: Self-pay

## 2021-11-29 ENCOUNTER — Other Ambulatory Visit (HOSPITAL_COMMUNITY): Payer: Self-pay

## 2021-12-01 ENCOUNTER — Other Ambulatory Visit: Payer: Self-pay

## 2021-12-01 NOTE — Patient Outreach (Signed)
[facility] Customer service manager [facility]) Care Management ? ?12/01/2021 ? ?Jerry Humphrey ?09/15/1960 ?[phone] ? ? ?3 outreach attempts were completed to obtain mRs. mRs could not be obtained because patient never returned my calls. mRs=7 ? ?Called on 11/29/21 notation not saved dues to Epic error. ?  ? ?[name] ?Care Management Assistant ?[phone] ? ?

## 2021-12-05 ENCOUNTER — Other Ambulatory Visit (HOSPITAL_COMMUNITY): Payer: Self-pay

## 2022-02-10 ENCOUNTER — Other Ambulatory Visit: Payer: Self-pay

## 2022-02-10 ENCOUNTER — Encounter (HOSPITAL_COMMUNITY): Payer: Self-pay | Admitting: Emergency Medicine

## 2022-02-10 ENCOUNTER — Emergency Department (HOSPITAL_COMMUNITY): Payer: Medicaid Other

## 2022-02-10 ENCOUNTER — Emergency Department (HOSPITAL_COMMUNITY)
Admission: EM | Admit: 2022-02-10 | Discharge: 2022-02-11 | Disposition: A | Discharge status: Home / Self Care | Payer: Medicaid Other | Attending: Emergency Medicine | Admitting: Emergency Medicine

## 2022-02-10 DIAGNOSIS — Y9301 Activity, walking, marching and hiking: Secondary | ICD-10-CM | POA: Diagnosis not present

## 2022-02-10 DIAGNOSIS — I10 Essential (primary) hypertension: Secondary | ICD-10-CM | POA: Insufficient documentation

## 2022-02-10 DIAGNOSIS — W010XXA Fall on same level from slipping, tripping and stumbling without subsequent striking against object, initial encounter: Secondary | ICD-10-CM | POA: Insufficient documentation

## 2022-02-10 DIAGNOSIS — Y92410 Unspecified street and highway as the place of occurrence of the external cause: Secondary | ICD-10-CM | POA: Insufficient documentation

## 2022-02-10 DIAGNOSIS — S2242XA Multiple fractures of ribs, left side, initial encounter for closed fracture: Secondary | ICD-10-CM | POA: Insufficient documentation

## 2022-02-10 DIAGNOSIS — R55 Syncope and collapse: Secondary | ICD-10-CM | POA: Diagnosis not present

## 2022-02-10 DIAGNOSIS — F17210 Nicotine dependence, cigarettes, uncomplicated: Secondary | ICD-10-CM | POA: Insufficient documentation

## 2022-02-10 DIAGNOSIS — S299XXA Unspecified injury of thorax, initial encounter: Secondary | ICD-10-CM | POA: Diagnosis present

## 2022-02-10 DIAGNOSIS — R0781 Pleurodynia: Secondary | ICD-10-CM

## 2022-02-10 DIAGNOSIS — W19XXXA Unspecified fall, initial encounter: Secondary | ICD-10-CM

## 2022-02-10 LAB — CBC
HCT: 44.4 % (ref 39.0–52.0)
Hemoglobin: 15.1 g/dL (ref 13.0–17.0)
MCH: 29.4 pg (ref 26.0–34.0)
MCHC: 34 g/dL (ref 30.0–36.0)
MCV: 86.4 fL (ref 80.0–100.0)
Platelets: 280 10*3/uL (ref 150–400)
RBC: 5.14 MIL/uL (ref 4.22–5.81)
RDW: 12.7 % (ref 11.5–15.5)
WBC: 9.9 10*3/uL (ref 4.0–10.5)
nRBC: 0 % (ref 0.0–0.2)

## 2022-02-10 LAB — BASIC METABOLIC PANEL
Anion gap: 14 (ref 5–15)
BUN: 13 mg/dL (ref 8–23)
CO2: 19 mmol/L — ABNORMAL LOW (ref 22–32)
Calcium: 9.4 mg/dL (ref 8.9–10.3)
Chloride: 105 mmol/L (ref 98–111)
Creatinine, Ser: 1.03 mg/dL (ref 0.61–1.24)
GFR, Estimated: >60 mL/min (ref >60)
Glucose, Bld: 69 mg/dL — ABNORMAL LOW (ref 70–99)
Potassium: 3.9 mmol/L (ref 3.5–5.1)
Sodium: 138 mmol/L (ref 135–145)

## 2022-02-10 MED ORDER — HYDROCODONE-ACETAMINOPHEN 5-325 MG PO TABS
1.0000 | ORAL_TABLET | Freq: Once | ORAL | Status: AC
  Administered 2022-02-10: 1 via ORAL
  Filled 2022-02-10: qty 1

## 2022-02-10 NOTE — ED Triage Notes (Signed)
Jerry Humphrey from the street. Jerry states he was intending to walk from [facility] to [place], [place] (approx a 30 minute drive Jerry intended to walk) when he states he suddenly woke up on the sidewalk. Jerry reports chronic back pain and chronic urinary incontinence. Jerry is alert, oriented, and in no apparent distress at this time.

## 2022-02-10 NOTE — ED Provider Notes (Signed)
[facility]-EMERGENCY DEPT [facility] Emergency Department Provider Note MRN:  [id number]  Arrival date & time: 02/11/22     Chief Complaint   Loss of Consciousness   History of Present Illness   Jerry Humphrey is a 61 y.o. year-old male with a history of hypertension, alcohol use disorder presenting to the ED with chief complaint of loss of consciousness.  Patient explains that he was walking in the heat today, tripped and fell.  Thinks that his cane caught on something on the ground.  Thinks he may have passed out from the heat also.  Review of Systems  A thorough review of systems was obtained and all systems are negative except as noted in the HPI and PMH.   Patient's Health History    Past Medical History:  Diagnosis Date   ETOH abuse    Hypertension    Seizures (HCC)     Past Surgical History:  Procedure Laterality Date   OPEN REDUCTION INTERNAL FIXATION (ORIF) TIBIA/FIBULA FRACTURE Right 02/01/2019   Procedure: OPEN REDUCTION INTERNAL FIXATION (ORIF) TIBIA/FIBULA FRACTURE;  Surgeon: Beverely Low, MD;  Location: [facility] ORS;  Service: Orthopedics;  Laterality: Right;    Family History  Problem Relation Age of Onset   Diabetes Neg Hx    CAD Neg Hx     Social History   Socioeconomic History   Marital status: Single    Spouse name: Not on file   Number of children: Not on file   Years of education: Not on file   Highest education level: Not on file  Occupational History   Not on file  Tobacco Use   Smoking status: Every Day    Packs/day: 0.50    Types: Cigarettes   Smokeless tobacco: Never  Substance and Sexual Activity   Alcohol use: Yes    Alcohol/week: 3.0 standard drinks of alcohol    Types: 3 Cans of beer per week    Comment: 3 64 oz can of beer   Drug use: Never   Sexual activity: Not on file  Other Topics Concern   Not on file  Social History Narrative   Not on file   Social Determinants of Health   Financial Resource Strain: Not on file  Food  Insecurity: Not on file  Transportation Needs: Not on file  Physical Activity: Not on file  Stress: Not on file  Social Connections: Not on file  Intimate Partner Violence: Not on file     Physical Exam   Vitals:   02/10/22 2034 02/10/22 2334  BP: 120/72 138/88  Pulse: 68 82  Resp: 20 19  Temp: 97.7 F (36.5 C) 98 F (36.7 C)  SpO2: 97% 96%    CONSTITUTIONAL: Chronically ill-appearing, NAD NEURO/PSYCH:  Alert and oriented x 3, right leg weakness (chronic) EYES:  eyes equal and reactive ENT/NECK:  no LAD, no JVD CARDIO: Regular rate, well-perfused, normal S1 and S2, tenderness to the left anterior ribs PULM:  CTAB no wheezing or rhonchi GI/GU:  non-distended, non-tender MSK/SPINE:  No gross deformities, no edema SKIN:  no rash, atraumatic   *Additional and/or pertinent findings included in MDM below  Diagnostic and Interventional Summary    EKG Interpretation  Date/Time:  Saturday February 10 2022 17:03:44 EDT Ventricular Rate:  81 PR Interval:  148 QRS Duration: 90 QT Interval:  402 QTC Calculation: 466 R Axis:   45 Text Interpretation: Normal sinus rhythm Normal ECG When compared with ECG of 24-Nov-2021 21:14, PREVIOUS ECG IS PRESENT Confirmed by  Kennis Carina [phone]) on 02/10/2022 11:50:24 PM       Labs Reviewed  BASIC METABOLIC PANEL - Abnormal; Notable for the following components:      Result Value   CO2 19 (*)    Glucose, Bld 69 (*)    All other components within normal limits  CBC  URINALYSIS, ROUTINE W REFLEX MICROSCOPIC  CBG MONITORING, ED    DG Chest 2 View  Final Result    CT Head Wo Contrast  Final Result      Medications  HYDROcodone-acetaminophen (NORCO/VICODIN) 5-325 MG per tablet 1 tablet (1 tablet Oral Given 02/10/22 2353)     Procedures  /  Critical Care Procedures  ED Course and Medical Decision Making  Initial Impression and Ddx Mechanical fall but also possibly syncope in the setting of heat exhaustion.  Patient with normal  vital signs, reassuring neurological exam at this time, does have residual deficits from the stroke but he says that there is nothing that is changed.  His main complaint at this time and some continued rib pain from the fall on the left side.  Will obtain chest x-ray to evaluate for rib fracture, pneumothorax.  Abdomen is soft and nontender, doubt intra-abdominal injury.  Past medical/surgical history that increases complexity of ED encounter: None  Interpretation of Diagnostics I personally reviewed the EKG and my interpretation is as follows: Sinus rhythm, no significant change from prior  Labs reveal mild acidosis favored to be due to dehydration, otherwise no significant blood count or electrolyte disturbance.  CT head is not acute.  Chest x-ray is without pneumothorax, there is evidence of displaced rib fractures on the left.  Patient Reassessment and Ultimate Disposition/Management     Patient continues to look and feel well with normal vital signs.  At this point, 8+ hours after the trauma, if he had any more significant injuries such as lung, spleen, kidney injury, I would have expected hemodynamic instability or worsening of condition at this point.  And so suspect isolated rib fractures, patient is appropriate for discharge with pain control.  Patient management required discussion with the following services or consulting groups:  None  Complexity of Problems Addressed Acute illness or injury that poses threat of life of bodily function  Additional Data Reviewed and Analyzed Further history obtained from: None  Additional Factors Impacting ED Encounter Risk SDOH impact on management  Elmer Sow. Pilar Plate, MD [facility] Emergency Medicine [facility] [email or web address]  Final Clinical Impressions(s) / ED Diagnoses     ICD-10-CM   1. Fall, initial encounter  W19.XXXA     2. Rib pain  R07.81     3. Closed fracture of multiple ribs of left side, initial  encounter  S22.42XA       ED Discharge Orders          Ordered    naproxen (NAPROSYN) 500 MG tablet  2 times daily        02/11/22 0044    lidocaine (LIDODERM) 5 %  Every 24 hours        02/11/22 0044    oxyCODONE (ROXICODONE) 5 MG immediate release tablet  Every 4 hours PRN        02/11/22 0044             Discharge Instructions Discussed with and Provided to Patient:     Discharge Instructions      You were evaluated in the Emergency Department and after careful evaluation, we did not find  any emergent condition requiring admission or further testing in the hospital.  Your exam/testing today is overall reassuring.  Your x-ray has shown a few broken ribs.  Use the Naprosyn twice daily for pain.  Use the numbing patches daily.  Can use the oxycodone for more significant pain.  Please return to the Emergency Department if you experience any worsening of your condition.   Thank you for allowing [place] to be a part of your care.       Sabas Sous, MD 02/11/22 [phone]

## 2022-02-10 NOTE — ED Provider Triage Note (Signed)
Emergency Medicine Provider Triage Evaluation Note  Jerry Humphrey , a 61 y.o. male  was evaluated in triage.  Pt complains of feeling dizzy, states he was walking down the road when he passed out and someone called 911, denies drinking etoh or any water today. Reports having heat stroke in the past, feels similar. Not on blood thinners.  Prior CVA Review of Systems  Positive: dizzy Negative: Unilateral weakness or numbness  Physical Exam  BP 120/81   Pulse 80   Temp 98 F (36.7 C) (Oral)   Resp 16   SpO2 95%  Gen:   Awake, no distress   Resp:  Normal effort  MSK:   Moves extremities without difficulty  Other:  Speech slurred, notes his baseline from CVA  Medical Decision Making  Medically screening exam initiated at 5:19 PM.  Appropriate orders placed.  Jerry Humphrey was informed that the remainder of the evaluation will be completed by another provider, this initial triage assessment does not replace that evaluation, and the importance of remaining in the ED until their evaluation is complete.     Jeannie Fend, PA-C 02/10/22 1720

## 2022-02-11 ENCOUNTER — Emergency Department (HOSPITAL_COMMUNITY): Payer: Medicaid Other

## 2022-02-11 MED ORDER — LIDOCAINE 5 % EX PTCH
1.0000 | MEDICATED_PATCH | CUTANEOUS | 0 refills | Status: AC
  Filled 2022-02-11: qty 5, 5d supply, fill #0

## 2022-02-11 MED ORDER — NAPROXEN 500 MG PO TABS
500.0000 mg | ORAL_TABLET | Freq: Two times a day (BID) | ORAL | 0 refills | Status: AC
  Filled 2022-02-11: qty 30, 15d supply, fill #0

## 2022-02-11 MED ORDER — OXYCODONE HCL 5 MG PO TABS
5.0000 mg | ORAL_TABLET | ORAL | 0 refills | Status: AC | PRN
  Filled 2022-02-11: qty 8, 2d supply, fill #0

## 2022-02-11 NOTE — ED Notes (Signed)
Allowed pt to make a phone call requesting ride home

## 2022-02-11 NOTE — ED Notes (Addendum)
Discharge instructions discussed with pt. Pt verbalized understanding with no questions at this time.   Attempted to give pt incentive spirometer. Pt refused and threw it away.   Pt ambulatory at discharge.

## 2022-02-11 NOTE — Discharge Instructions (Signed)
You were evaluated in the Emergency Department and after careful evaluation, we did not find any emergent condition requiring admission or further testing in the hospital.  Your exam/testing today is overall reassuring.  Your x-ray has shown a few broken ribs.  Use the Naprosyn twice daily for pain.  Use the numbing patches daily.  Can use the oxycodone for more significant pain.  Please return to the Emergency Department if you experience any worsening of your condition.   Thank you for allowing [place] to be a part of your care.

## 2022-02-12 ENCOUNTER — Other Ambulatory Visit (HOSPITAL_COMMUNITY): Payer: Self-pay

## 2022-02-20 ENCOUNTER — Other Ambulatory Visit (HOSPITAL_COMMUNITY): Payer: Self-pay

## 2022-09-30 ENCOUNTER — Emergency Department (HOSPITAL_COMMUNITY)
Admission: EM | Admit: 2022-09-30 | Discharge: 2022-09-30 | Discharge status: Against Medical Advice | Payer: Medicaid Other | Attending: Emergency Medicine | Admitting: Emergency Medicine

## 2022-09-30 DIAGNOSIS — Z5321 Procedure and treatment not carried out due to patient leaving prior to being seen by health care provider: Secondary | ICD-10-CM

## 2022-09-30 NOTE — ED Notes (Signed)
Called pt x3 for pull til full triage, no response. Seen pt sit down and stand back up and go back to registration desk, after a few minutes seen pt leaving.

## 2022-09-30 NOTE — ED Provider Notes (Signed)
I was informed this patient eloped from the emergency department without being seen.   Tretha Sciara, MD 09/30/22 [phone]

## 2022-11-10 ENCOUNTER — Emergency Department (HOSPITAL_COMMUNITY)
Admission: EM | Admit: 2022-11-10 | Discharge: 2022-11-10 | Disposition: A | Discharge status: Home / Self Care | Payer: Medicaid Other | Attending: Emergency Medicine | Admitting: Emergency Medicine

## 2022-11-10 ENCOUNTER — Emergency Department (HOSPITAL_COMMUNITY): Payer: Medicaid Other

## 2022-11-10 ENCOUNTER — Other Ambulatory Visit: Payer: Self-pay

## 2022-11-10 ENCOUNTER — Encounter (HOSPITAL_COMMUNITY): Payer: Self-pay | Admitting: Emergency Medicine

## 2022-11-10 DIAGNOSIS — R519 Headache, unspecified: Secondary | ICD-10-CM | POA: Diagnosis not present

## 2022-11-10 DIAGNOSIS — M545 Low back pain, unspecified: Secondary | ICD-10-CM | POA: Insufficient documentation

## 2022-11-10 DIAGNOSIS — G8929 Other chronic pain: Secondary | ICD-10-CM

## 2022-11-10 DIAGNOSIS — R42 Dizziness and giddiness: Secondary | ICD-10-CM | POA: Diagnosis present

## 2022-11-10 DIAGNOSIS — R079 Chest pain, unspecified: Secondary | ICD-10-CM | POA: Diagnosis not present

## 2022-11-10 DIAGNOSIS — I10 Essential (primary) hypertension: Secondary | ICD-10-CM | POA: Diagnosis not present

## 2022-11-10 DIAGNOSIS — Z8673 Personal history of transient ischemic attack (TIA), and cerebral infarction without residual deficits: Secondary | ICD-10-CM | POA: Diagnosis not present

## 2022-11-10 DIAGNOSIS — Z79899 Other long term (current) drug therapy: Secondary | ICD-10-CM | POA: Insufficient documentation

## 2022-11-10 LAB — TROPONIN I (HIGH SENSITIVITY)
Troponin I (High Sensitivity): 3 ng/L (ref <18)
Troponin I (High Sensitivity): 3 ng/L (ref <18)

## 2022-11-10 LAB — COMPREHENSIVE METABOLIC PANEL
ALT: 84 U/L — ABNORMAL HIGH (ref 0–44)
AST: 74 U/L — ABNORMAL HIGH (ref 15–41)
Albumin: 3.8 g/dL (ref 3.5–5.0)
Alkaline Phosphatase: 62 U/L (ref 38–126)
Anion gap: 10 (ref 5–15)
BUN: 12 mg/dL (ref 8–23)
CO2: 24 mmol/L (ref 22–32)
Calcium: 9.5 mg/dL (ref 8.9–10.3)
Chloride: 100 mmol/L (ref 98–111)
Creatinine, Ser: 1.06 mg/dL (ref 0.61–1.24)
GFR, Estimated: >60 mL/min (ref >60)
Glucose, Bld: 104 mg/dL — ABNORMAL HIGH (ref 70–99)
Potassium: 4.5 mmol/L (ref 3.5–5.1)
Sodium: 134 mmol/L — ABNORMAL LOW (ref 135–145)
Total Bilirubin: 0.9 mg/dL (ref 0.3–1.2)
Total Protein: 7.6 g/dL (ref 6.5–8.1)

## 2022-11-10 LAB — CBC
HCT: 43.3 % (ref 39.0–52.0)
Hemoglobin: 14.9 g/dL (ref 13.0–17.0)
MCH: 28.4 pg (ref 26.0–34.0)
MCHC: 34.4 g/dL (ref 30.0–36.0)
MCV: 82.5 fL (ref 80.0–100.0)
Platelets: 266 10*3/uL (ref 150–400)
RBC: 5.25 MIL/uL (ref 4.22–5.81)
RDW: 12.8 % (ref 11.5–15.5)
WBC: 6.4 10*3/uL (ref 4.0–10.5)
nRBC: 0 % (ref 0.0–0.2)

## 2022-11-10 LAB — I-STAT BETA HCG BLOOD, ED (MC, WL, AP ONLY): I-stat hCG, quantitative: <5 m[IU]/mL (ref <5)

## 2022-11-10 LAB — ETHANOL: Alcohol, Ethyl (B): <10 mg/dL (ref <10)

## 2022-11-10 MED ORDER — KETOROLAC TROMETHAMINE 15 MG/ML IJ SOLN
15.0000 mg | Freq: Once | INTRAMUSCULAR | Status: AC
  Administered 2022-11-10: 15 mg via INTRAVENOUS
  Filled 2022-11-10: qty 1

## 2022-11-10 NOTE — ED Provider Triage Note (Signed)
Emergency Medicine Provider Triage Evaluation Note  Jerry Humphrey , a 62 y.o. male  was evaluated in triage.  Pt complains of dizziness, headache, and chest pain. He states that he woke up this morning in good health and then approximately 3 hours ago developed a headache that is frontal in nature behind his eyes with associated dizziness. He states that he has symptoms like this often since his intracerebral hemorrhage last year and he would not have come in for this, however soon after his headache and dizziness he developed chest pain. States he thought it was just GERD but this always resolves with milk and this time it did not. Pain resolved prior to arrival. Currently just endorses a headache, when asked if he is still dizzy he states 'I don think so.' Patient states his last drink was 4 days ago, states that since his brain hemorrhage he no longer abuses alcohol. Denies any recent falls or trauma.  Review of Systems  Positive:  Negative:   Physical Exam  BP (!) 152/90   Pulse 70   Temp 98 F (36.7 C) (Oral)   Resp 18   Ht  (1.727 m)   Wt 79.8 kg   SpO2 98%   BMI 26.76 kg/m  Gen:   Awake, no distress   Resp:  Normal effort  MSK:   Moves extremities without difficulty  Other:  Alert and oriented and neurologically intact without focal deficits  Medical Decision Making  Medically screening exam initiated at 1:11 PM.  Appropriate orders placed.  BRAYSON LIVESEY was informed that the remainder of the evaluation will be completed by another provider, this initial triage assessment does not replace that evaluation, and the importance of remaining in the ED until their evaluation is complete.     Silva Bandy, PA-C 11/10/22 1316

## 2022-11-10 NOTE — ED Provider Notes (Signed)
[facility] EMERGENCY DEPARTMENT AT [facility] Provider Note   CSN: [id number] Arrival date & time: 11/10/22  1258     History  Chief Complaint  Patient presents with   Dizziness    Jerry Humphrey is a 62 y.o. male with past medical history hypertension, alcohol use disorder, CVA who presents to the ED complaining of dizziness, headache, and chest pain.  Reports that this morning he suddenly developed dizziness described as a lightheadedness like he might pass out with a bilateral frontal headache.  Following this, he developed pain in the front of his chest.  He states that these are similar symptoms to when he had his previous stroke.  He denies any focal weakness, vision changes, shortness of breath, or paresthesias.  He denies any fall or head trauma.  He denies abdominal pain, nausea, vomiting, diarrhea, fever, or chills.  Reports that his last drink was a few days ago.  He denies any other associated substance use.  He reports that his headache and dizziness have resolved.  Still having mild chest pain but notes this is very minimal and mostly complains of pain in his back which is chronic.  No bowel or bladder dysfunction, urinary symptoms, or abdominal pain.  No history of IV drug use or malignancy.       Home Medications Prior to Admission medications   Medication Sig Start Date End Date Taking? Authorizing Provider  amLODipine (NORVASC) 10 MG tablet Take 1 tablet (10 mg total) by mouth daily. 11/24/21   Rancour, Jeannett Senior, MD  FLUoxetine (PROZAC) 20 MG capsule Take 1 capsule (20 mg total) by mouth daily. 11/24/21   Rancour, Jeannett Senior, MD  folic acid (FOLVITE) 1 MG tablet Take 1 tablet (1 mg total) by mouth daily. 08/26/21   Alfredo Martinez, MD  hydrochlorothiazide (HYDRODIURIL) 25 MG tablet Take 1 tablet (25 mg total) by mouth daily. 11/24/21   Rancour, Jeannett Senior, MD  lidocaine (LIDODERM) 5 % Place 1 patch onto the skin daily. Remove & Discard patch within 12 hours or as directed  by MD 02/11/22   Sabas Sous, MD  naproxen (NAPROSYN) 500 MG tablet Take 1 tablet (500 mg total) by mouth 2 (two) times daily. 02/11/22   Sabas Sous, MD  oxyCODONE (ROXICODONE) 5 MG immediate release tablet Take 1 tablet (5 mg total) by mouth every 4 (four) hours as needed for severe pain. 02/11/22   Sabas Sous, MD  rosuvastatin (CRESTOR) 20 MG tablet Take 1 tablet (20 mg total) by mouth daily. 11/24/21   Rancour, Jeannett Senior, MD  simethicone (MYLICON) 80 MG chewable tablet Chew 1 tablet (80 mg total) by mouth 4 (four) times daily as needed for flatulence. 09/01/21   Levin Erp, MD  thiamine 100 MG tablet Take 1 tablet (100 mg total) by mouth daily. 08/26/21   Alfredo Martinez, MD      Allergies    Codeine    Review of Systems   Review of Systems  All other systems reviewed and are negative.   Physical Exam Updated Vital Signs BP (!) 152/90   Pulse 70   Temp 98 F (36.7 C) (Oral)   Resp 18   Ht  (1.727 m)   Wt 79.8 kg   SpO2 98%   BMI 26.76 kg/m  Physical Exam Vitals and nursing note reviewed.  Constitutional:      General: He is not in acute distress.    Appearance: Normal appearance. He is not ill-appearing or toxic-appearing.  HENT:     Head: Normocephalic and atraumatic.     Mouth/Throat:     Mouth: Mucous membranes are moist.  Eyes:     Extraocular Movements: Extraocular movements intact.     Conjunctiva/sclera: Conjunctivae normal.     Pupils: Pupils are equal, round, and reactive to light.  Cardiovascular:     Rate and Rhythm: Normal rate and regular rhythm.     Heart sounds: No murmur heard. Pulmonary:     Effort: Pulmonary effort is normal. No respiratory distress.     Breath sounds: Normal breath sounds. No stridor. No wheezing, rhonchi or rales.  Chest:     Chest wall: No tenderness.  Abdominal:     General: Abdomen is flat. There is no distension.     Palpations: Abdomen is soft.     Tenderness: There is no abdominal tenderness. There is no  right CVA tenderness, left CVA tenderness, guarding or rebound.  Musculoskeletal:        General: Normal range of motion.     Cervical back: Normal range of motion and neck supple. No rigidity.     Right lower leg: No edema.     Left lower leg: No edema.     Comments: No midline cervical, thoracic, or lumbar spinal tenderness, step-offs, or deformities, able to change positions on the ED stretcher with ease and without signs of distress  Skin:    General: Skin is warm and dry.     Capillary Refill: Capillary refill takes less than 2 seconds.  Neurological:     Mental Status: He is alert and oriented to person, place, and time.     GCS: GCS eye subscore is 4. GCS verbal subscore is 5. GCS motor subscore is 6.     Cranial Nerves: Cranial nerves 2-12 are intact. No cranial nerve deficit, dysarthria or facial asymmetry.     Sensory: Sensation is intact.     Motor: Motor function is intact. No weakness, tremor, atrophy, abnormal muscle tone or seizure activity.     Coordination: Coordination is intact.     Comments: 5/5 strength to bilateral upper and lower extremities, moving all 4 extremities equally and spontaneously  Psychiatric:        Behavior: Behavior is cooperative.     Comments: Slightly slurred speech but comprehensible     ED Results / Procedures / Treatments   Labs (all labs ordered are listed, but only abnormal results are displayed) Labs Reviewed  COMPREHENSIVE METABOLIC PANEL - Abnormal; Notable for the following components:      Result Value   Sodium 134 (*)    Glucose, Bld 104 (*)    AST 74 (*)    ALT 84 (*)    All other components within normal limits  CBC  ETHANOL  I-STAT BETA HCG BLOOD, ED ([facility], [facility], [facility] ONLY)  TROPONIN I (HIGH SENSITIVITY)  TROPONIN I (HIGH SENSITIVITY)    EKG EKG Interpretation  Date/Time:  Saturday November 10 2022 12:52:18 EDT Ventricular Rate:  73 PR Interval:  154 QRS Duration: 86 QT Interval:  398 QTC Calculation: 438 R  Axis:   8 Text Interpretation: Normal sinus rhythm Normal ECG When compared with ECG of 10-Feb-2022 17:03, PREVIOUS ECG IS PRESENT Confirmed by Kristine Royal [phone]) on 11/10/2022 2:01:20 PM  Radiology CT Head Wo Contrast  Result Date: 11/10/2022 CLINICAL DATA:  Headache EXAM: CT HEAD WITHOUT CONTRAST TECHNIQUE: Contiguous axial images were obtained from the base of the skull through the vertex without  intravenous contrast. RADIATION DOSE REDUCTION: This exam was performed according to the departmental dose-optimization program which includes automated exposure control, adjustment of the mA and/or kV according to patient size and/or use of iterative reconstruction technique. COMPARISON:  CT head 02/10/22 FINDINGS: Brain: No evidence of acute infarction, hemorrhage, hydrocephalus, extra-axial collection or mass lesion/mass effect. Chronic left frontal lobe infarct. Sequela of severe chronic microvascular ischemic change. Vascular: No hyperdense vessel or unexpected calcification. Skull: Normal. Negative for fracture or focal lesion. Sinuses/Orbits: No middle ear or mastoid effusion. Impacted cerumen in bilateral EACs. Paranasal sinuses are clear. Other: None. IMPRESSION: 1. No acute intracranial abnormality. No specific etiology for headaches identified. 2. Chronic left frontal lobe infarct. Sequela of severe chronic microvascular ischemic change. Electronically Signed   By: Lorenza Cambridge M.D.   On: 11/10/2022 14:18   DG Chest 2 View  Result Date: 11/10/2022 CLINICAL DATA:  Chest pain EXAM: CHEST - 2 VIEW COMPARISON:  02/11/2022 FINDINGS: The heart size and mediastinal contours are within normal limits. Both lungs are clear. The visualized skeletal structures are unremarkable. Old left-sided rib fractures. IMPRESSION: No acute cardiopulmonary disease Electronically Signed   By: Karen Kays M.D.   On: 11/10/2022 14:01    Procedures Procedures    Medications Ordered in ED Medications  ketorolac  (TORADOL) 15 MG/ML injection 15 mg (15 mg Intravenous Given 11/10/22 1443)    ED Course/ Medical Decision Making/ A&P                             Medical Decision Making Amount and/or Complexity of Data Reviewed Labs: ordered. Decision-making details documented in ED Course. Radiology: ordered. Decision-making details documented in ED Course. ECG/medicine tests: ordered. Decision-making details documented in ED Course.   Medical Decision Making:   [name] is a 62 y.o. male who presented to the ED today with dizziness, chest pain, headache detailed above.    Patient's presentation is complicated by their history of alcohol use, history of CVA.  Patient placed on continuous vitals and telemetry monitoring while in ED which was reviewed periodically.  Complete initial physical exam performed, notably the patient was in no acute distress.   Regular rate and rhythm.  Lungs clear to auscultation.  No lower extremity edema.  Slightly slurred speech but otherwise nonfocal neurologic exam. Reviewed and confirmed nursing documentation for past medical history, family history, social history.    Initial Assessment:   With the patient's presentation, differential diagnosis includes but is not limited to ICH, head trauma, AVM, intracranial tumor, multiple sclerosis, drug-related, CVA/TIA, vasovagal syncope, orthostatic hypotension, sepsis, hypoglycemia, electrolyte disturbance, anemia, anxiety/panic attack, ACS, arrhythmia, acute kidney injury, dehydration. This is most consistent with an acute complicated illness  Initial Plan:  Screening labs including CBC and Metabolic panel to evaluate for infectious or metabolic etiology of disease.  Urinalysis with reflex culture ordered to evaluate for UTI or relevant urologic/nephrologic pathology.  Ethanol level to evaluate for substance etiology CXR to evaluate for structural/infectious intrathoracic pathology.  EKG and troponin to evaluate for  cardiac pathology CT brain to evaluate for intracranial pathology Dose of Toradol for chronic back pain Objective evaluation as below reviewed   Initial Study Results:   Laboratory  All laboratory results reviewed without evidence of clinically relevant pathology.   Exceptions include: Sodium 134  EKG EKG was reviewed independently. Rate, rhythm, axis, intervals all examined and without medically relevant abnormality. ST segments without concerns for elevations.  Radiology:  All images reviewed independently. Agree with radiology report at this time.   CT Head Wo Contrast  Result Date: 11/10/2022 CLINICAL DATA:  Headache EXAM: CT HEAD WITHOUT CONTRAST TECHNIQUE: Contiguous axial images were obtained from the base of the skull through the vertex without intravenous contrast. RADIATION DOSE REDUCTION: This exam was performed according to the departmental dose-optimization program which includes automated exposure control, adjustment of the mA and/or kV according to patient size and/or use of iterative reconstruction technique. COMPARISON:  CT head 02/10/22 FINDINGS: Brain: No evidence of acute infarction, hemorrhage, hydrocephalus, extra-axial collection or mass lesion/mass effect. Chronic left frontal lobe infarct. Sequela of severe chronic microvascular ischemic change. Vascular: No hyperdense vessel or unexpected calcification. Skull: Normal. Negative for fracture or focal lesion. Sinuses/Orbits: No middle ear or mastoid effusion. Impacted cerumen in bilateral EACs. Paranasal sinuses are clear. Other: None. IMPRESSION: 1. No acute intracranial abnormality. No specific etiology for headaches identified. 2. Chronic left frontal lobe infarct. Sequela of severe chronic microvascular ischemic change. Electronically Signed   By: Lorenza Cambridge M.D.   On: 11/10/2022 14:18   DG Chest 2 View  Result Date: 11/10/2022 CLINICAL DATA:  Chest pain EXAM: CHEST - 2 VIEW COMPARISON:  02/11/2022 FINDINGS: The  heart size and mediastinal contours are within normal limits. Both lungs are clear. The visualized skeletal structures are unremarkable. Old left-sided rib fractures. IMPRESSION: No acute cardiopulmonary disease Electronically Signed   By: Karen Kays M.D.   On: 11/10/2022 14:01     Final Assessment and Plan:   62 year old male presenting to the ED for evaluation of multiple symptoms.  Patient reports that he had dizziness and a headache this morning followed by onset of chest pain.  Reports history of similar symptoms with previous CVA.  No residual deficits from that episode.  Also has history of underlying alcohol use disorder.  Evaluated in triage with some workup obtained.  Overall, this workup fairly unremarkable at time of discharge.  Evaluation.  Added ethanol level as patient has mildly slurred speech.  Otherwise nonfocal neuroexam.  CT brain resulted as negative.  First troponin negative.  EKG normal sinus rhythm without acute ST-T changes.  With new onset of chest pain, would repeat troponin but history does not appear to be consistent with ACS.  Low suspicion for this.  No significant electrolyte disturbance, creatinine within normal limits.  No leukocytosis, patient afebrile, do not suspect infectious source. Remainder of workup pending at shift change, care transitioned to oncoming PA-C, Meghan Clark pending MRI, repeat troponin. If these are negative, anticipate pt can be discharged home as he desires.    Clinical Impression:  1. Dizziness   2. Acute nonintractable headache, unspecified headache type   3. Chest pain, unspecified type   4. Chronic bilateral low back pain without sciatica      Data Unavailable           Final Clinical Impression(s) / ED Diagnoses Final diagnoses:  Dizziness  Acute nonintractable headache, unspecified headache type  Chest pain, unspecified type  Chronic bilateral low back pain without sciatica    Rx / DC Orders ED Discharge Orders      None         Tonette Lederer, PA-C 11/10/22 1507    Wynetta Fines, MD 11/10/22 1544

## 2022-11-10 NOTE — ED Triage Notes (Signed)
Per EMS pt coming from home for dizziness and headache onset of this morning. Patient reports an episode of chest pain en route and given 324 aspirin. Reports left sided deficits from previous stroke.

## 2022-11-10 NOTE — Discharge Instructions (Addendum)
You were evaluated in the Emergency Department for dizziness, headache, and chest pain.  Your laboratory work-up and imaging were overall reassuring.   Please follow-up with your primary care provider as soon as possible.   Return to the ED if you experience worsening of your symptoms or if you have any new concerns.

## 2022-11-10 NOTE — ED Provider Notes (Signed)
Accepted handoff at shift change from [facility], PA-C. Please see prior provider note for more detail.   Briefly: Patient is 62 y.o. with past medical history of hypertension, alcohol use disorder, CVA presents to the ED complaining of dizziness, headache, and chest pain.  Symptoms began this morning and he states they are similar to his previous stroke.  Dizziness and headache have resolved at this time, but he is still having mild chest pain.    DDX: concern for CVA, TIA, substance related, ACS, dehydration, electrolyte derangement  Plan: Awaiting results of repeat troponin and MRI.  If these are negative for acute findings, plan is to discharge patient home.      Results for orders placed or performed during the hospital encounter of 11/10/22  CBC  Result Value Ref Range   WBC 6.4 4.0 - 10.5 K/uL   RBC 5.25 4.22 - 5.81 MIL/uL   Hemoglobin 14.9 13.0 - 17.0 g/dL   HCT 16.1 09.6 - 04.5 %   MCV 82.5 80.0 - 100.0 fL   MCH 28.4 26.0 - 34.0 pg   MCHC 34.4 30.0 - 36.0 g/dL   RDW 40.9 81.1 - 91.4 %   Platelets 266 150 - 400 K/uL   nRBC 0.0 0.0 - 0.2 %  Comprehensive metabolic panel  Result Value Ref Range   Sodium 134 (L) 135 - 145 mmol/L   Potassium 4.5 3.5 - 5.1 mmol/L   Chloride 100 98 - 111 mmol/L   CO2 24 22 - 32 mmol/L   Glucose, Bld 104 (H) 70 - 99 mg/dL   BUN 12 8 - 23 mg/dL   Creatinine, Ser 7.82 0.61 - 1.24 mg/dL   Calcium 9.5 8.9 - 95.6 mg/dL   Total Protein 7.6 6.5 - 8.1 g/dL   Albumin 3.8 3.5 - 5.0 g/dL   AST 74 (H) 15 - 41 U/L   ALT 84 (H) 0 - 44 U/L   Alkaline Phosphatase 62 38 - 126 U/L   Total Bilirubin 0.9 0.3 - 1.2 mg/dL   GFR, Estimated >21 >30 mL/min   Anion gap 10 5 - 15  I-Stat beta hCG blood, ED ([facility], [facility], [facility] only)  Result Value Ref Range   I-stat hCG, quantitative <5.0 <5 mIU/mL   Comment 3          Troponin I (High Sensitivity)  Result Value Ref Range   Troponin I (High Sensitivity) 3 <18 ng/L   CT Head Wo Contrast  Result Date:  11/10/2022 CLINICAL DATA:  Headache EXAM: CT HEAD WITHOUT CONTRAST TECHNIQUE: Contiguous axial images were obtained from the base of the skull through the vertex without intravenous contrast. RADIATION DOSE REDUCTION: This exam was performed according to the departmental dose-optimization program which includes automated exposure control, adjustment of the mA and/or kV according to patient size and/or use of iterative reconstruction technique. COMPARISON:  CT head 02/10/22 FINDINGS: Brain: No evidence of acute infarction, hemorrhage, hydrocephalus, extra-axial collection or mass lesion/mass effect. Chronic left frontal lobe infarct. Sequela of severe chronic microvascular ischemic change. Vascular: No hyperdense vessel or unexpected calcification. Skull: Normal. Negative for fracture or focal lesion. Sinuses/Orbits: No middle ear or mastoid effusion. Impacted cerumen in bilateral EACs. Paranasal sinuses are clear. Other: None. IMPRESSION: 1. No acute intracranial abnormality. No specific etiology for headaches identified. 2. Chronic left frontal lobe infarct. Sequela of severe chronic microvascular ischemic change. Electronically Signed   By: Lorenza Cambridge M.D.   On: 11/10/2022 14:18   DG Chest 2 View  Result Date: 11/10/2022 CLINICAL DATA:  Chest pain EXAM: CHEST - 2 VIEW COMPARISON:  02/11/2022 FINDINGS: The heart size and mediastinal contours are within normal limits. Both lungs are clear. The visualized skeletal structures are unremarkable. Old left-sided rib fractures. IMPRESSION: No acute cardiopulmonary disease Electronically Signed   By: Karen Kays M.D.   On: 11/10/2022 14:01    Physical Exam  BP (!) 152/90   Pulse 70   Temp 98 F (36.7 C) (Oral)   Resp 18   Ht  (1.727 m)   Wt 79.8 kg   SpO2 98%   BMI 26.76 kg/m   Physical Exam Vitals and nursing note reviewed.  Constitutional:      General: He is not in acute distress.    Appearance: Normal appearance. He is not ill-appearing or  diaphoretic.  HENT:     Head: Normocephalic and atraumatic.     Mouth/Throat:     Mouth: Mucous membranes are moist.  Eyes:     Extraocular Movements: Extraocular movements intact.     Pupils: Pupils are equal, round, and reactive to light.  Cardiovascular:     Rate and Rhythm: Normal rate and regular rhythm.  Pulmonary:     Effort: Pulmonary effort is normal.  Musculoskeletal:     Cervical back: Full passive range of motion without pain.  Skin:    General: Skin is warm and dry.     Capillary Refill: Capillary refill takes less than 2 seconds.  Neurological:     General: No focal deficit present.     Mental Status: He is alert and oriented to person, place, and time. Mental status is at baseline.     Motor: Motor function is intact.     Coordination: Coordination is intact.     Comments: Moves all extremities appropriately.  Able to sit up in bed without assistance.    Psychiatric:        Mood and Affect: Mood normal.        Behavior: Behavior normal.     Procedures  Procedures  ED Course / MDM    Medical Decision Making Amount and/or Complexity of Data Reviewed Labs: ordered. Radiology: ordered.  Risk Prescription drug management.   Patient presents to ED complaining of headache, dizziness, and chest pain.  Laboratory work-up is overall reassuring and I have low suspicion for ACS or cardiac cause of chest pain.  Troponins are negative.    MRI of brain was performed and demonstrates no acute intracranial process to explain patient's symptoms.   Upon reassessment of patient, he states that he feels better, but he is hungry and upset that he has "wasted his time" in the ED.  Discussed with patient results from work-up were reassuring and that he will need PCP follow-up.  Patient also inquired about his "heart medication" and states the last time he was here he was given "something for his medicine".  Upon chart review, did not see any recent refills to medications or  notes regarding medications.    Advised patient to follow-up with his primary care provider as soon as possible.  The patient has been appropriately medically screened and/or stabilized in the ED. I have low suspicion for any other emergent medical condition which would require further screening, evaluation or treatment in the ED or require inpatient management. At time of discharge the patient is hemodynamically stable and in no acute distress. I have discussed work-up results and diagnosis with patient and answered all questions. Patient is  agreeable with discharge plan. We discussed strict return precautions for returning to the emergency department and they verbalized understanding.         Melton Alar R, PA-C 11/10/22 2039    Loetta Rough, MD 11/12/22 [phone]

## 2023-06-01 ENCOUNTER — Emergency Department (HOSPITAL_COMMUNITY)
Admission: EM | Admit: 2023-06-01 | Discharge: 2023-06-01 | Disposition: A | Discharge status: Home / Self Care | Payer: MEDICAID | Attending: Emergency Medicine | Admitting: Emergency Medicine

## 2023-06-01 ENCOUNTER — Encounter (HOSPITAL_COMMUNITY): Payer: Self-pay

## 2023-06-01 ENCOUNTER — Emergency Department (HOSPITAL_COMMUNITY): Payer: MEDICAID

## 2023-06-01 DIAGNOSIS — Y9301 Activity, walking, marching and hiking: Secondary | ICD-10-CM | POA: Insufficient documentation

## 2023-06-01 DIAGNOSIS — S0990XA Unspecified injury of head, initial encounter: Secondary | ICD-10-CM | POA: Insufficient documentation

## 2023-06-01 DIAGNOSIS — W541XXA Struck by dog, initial encounter: Secondary | ICD-10-CM | POA: Insufficient documentation

## 2023-06-01 DIAGNOSIS — Z79899 Other long term (current) drug therapy: Secondary | ICD-10-CM | POA: Insufficient documentation

## 2023-06-01 DIAGNOSIS — I10 Essential (primary) hypertension: Secondary | ICD-10-CM | POA: Insufficient documentation

## 2023-06-01 DIAGNOSIS — R791 Abnormal coagulation profile: Secondary | ICD-10-CM | POA: Diagnosis not present

## 2023-06-01 DIAGNOSIS — Z760 Encounter for issue of repeat prescription: Secondary | ICD-10-CM | POA: Insufficient documentation

## 2023-06-01 DIAGNOSIS — W19XXXA Unspecified fall, initial encounter: Secondary | ICD-10-CM

## 2023-06-01 DIAGNOSIS — Y903 Blood alcohol level of 60-79 mg/100 ml: Secondary | ICD-10-CM | POA: Insufficient documentation

## 2023-06-01 LAB — RAPID URINE DRUG SCREEN, HOSP PERFORMED
Amphetamines: NOT DETECTED
Barbiturates: NOT DETECTED
Benzodiazepines: NOT DETECTED
Cocaine: NOT DETECTED
Opiates: NOT DETECTED
Tetrahydrocannabinol: NOT DETECTED

## 2023-06-01 LAB — DIFFERENTIAL
Abs Immature Granulocytes: 0 10*3/uL (ref 0.00–0.07)
Basophils Absolute: 0.1 10*3/uL (ref 0.0–0.1)
Basophils Relative: 1 %
Eosinophils Absolute: 0.3 10*3/uL (ref 0.0–0.5)
Eosinophils Relative: 3 %
Lymphocytes Relative: 46 %
Lymphs Abs: 3.9 10*3/uL (ref 0.7–4.0)
Monocytes Absolute: 0.3 10*3/uL (ref 0.1–1.0)
Monocytes Relative: 4 %
Neutro Abs: 3.9 10*3/uL (ref 1.7–7.7)
Neutrophils Relative %: 46 %
nRBC: 0 /100{WBCs}

## 2023-06-01 LAB — I-STAT CHEM 8, ED
BUN: 11 mg/dL (ref 8–23)
Calcium, Ion: 1.19 mmol/L (ref 1.15–1.40)
Chloride: 102 mmol/L (ref 98–111)
Creatinine, Ser: 1.1 mg/dL (ref 0.61–1.24)
Glucose, Bld: 97 mg/dL (ref 70–99)
HCT: 43 % (ref 39.0–52.0)
Hemoglobin: 14.6 g/dL (ref 13.0–17.0)
Potassium: 4.1 mmol/L (ref 3.5–5.1)
Sodium: 138 mmol/L (ref 135–145)
TCO2: 24 mmol/L (ref 22–32)

## 2023-06-01 LAB — CBC
HCT: 42.9 % (ref 39.0–52.0)
Hemoglobin: 14.2 g/dL (ref 13.0–17.0)
MCH: 27.7 pg (ref 26.0–34.0)
MCHC: 33.1 g/dL (ref 30.0–36.0)
MCV: 83.6 fL (ref 80.0–100.0)
Platelets: 319 10*3/uL (ref 150–400)
RBC: 5.13 MIL/uL (ref 4.22–5.81)
RDW: 13.2 % (ref 11.5–15.5)
WBC: 8.5 10*3/uL (ref 4.0–10.5)
nRBC: 0 % (ref 0.0–0.2)

## 2023-06-01 LAB — COMPREHENSIVE METABOLIC PANEL
ALT: 93 U/L — ABNORMAL HIGH (ref 0–44)
AST: 103 U/L — ABNORMAL HIGH (ref 15–41)
Albumin: 3.7 g/dL (ref 3.5–5.0)
Alkaline Phosphatase: 73 U/L (ref 38–126)
Anion gap: 11 (ref 5–15)
BUN: 10 mg/dL (ref 8–23)
CO2: 19 mmol/L — ABNORMAL LOW (ref 22–32)
Calcium: 9.4 mg/dL (ref 8.9–10.3)
Chloride: 104 mmol/L (ref 98–111)
Creatinine, Ser: 1 mg/dL (ref 0.61–1.24)
GFR, Estimated: >60 mL/min (ref >60)
Glucose, Bld: 103 mg/dL — ABNORMAL HIGH (ref 70–99)
Potassium: 4 mmol/L (ref 3.5–5.1)
Sodium: 134 mmol/L — ABNORMAL LOW (ref 135–145)
Total Bilirubin: 0.7 mg/dL (ref <1.2)
Total Protein: 7.7 g/dL (ref 6.5–8.1)

## 2023-06-01 LAB — URINALYSIS, W/ REFLEX TO CULTURE (INFECTION SUSPECTED)
Bacteria, UA: NONE SEEN
Bilirubin Urine: NEGATIVE
Glucose, UA: NEGATIVE mg/dL
Hgb urine dipstick: NEGATIVE
Ketones, ur: NEGATIVE mg/dL
Leukocytes,Ua: NEGATIVE
Nitrite: NEGATIVE
Protein, ur: NEGATIVE mg/dL
Specific Gravity, Urine: 1.001 — ABNORMAL LOW (ref 1.005–1.030)
pH: 5 (ref 5.0–8.0)

## 2023-06-01 LAB — PROTIME-INR
INR: 1 (ref 0.8–1.2)
Prothrombin Time: 13.6 s (ref 11.4–15.2)

## 2023-06-01 LAB — APTT: aPTT: 31 s (ref 24–36)

## 2023-06-01 LAB — ETHANOL: Alcohol, Ethyl (B): 67 mg/dL — ABNORMAL HIGH (ref <10)

## 2023-06-01 MED ORDER — HYDROCHLOROTHIAZIDE 25 MG PO TABS
25.0000 mg | ORAL_TABLET | Freq: Every day | ORAL | 0 refills | Status: AC
  Filled 2023-06-01: qty 30, 30d supply, fill #0

## 2023-06-01 MED ORDER — ACETAMINOPHEN 500 MG PO TABS
1000.0000 mg | ORAL_TABLET | Freq: Once | ORAL | Status: AC
  Administered 2023-06-01: 1000 mg via ORAL
  Filled 2023-06-01: qty 2

## 2023-06-01 MED ORDER — AMLODIPINE BESYLATE 10 MG PO TABS
10.0000 mg | ORAL_TABLET | Freq: Every day | ORAL | 0 refills | Status: AC
  Filled 2023-06-01: qty 30, 30d supply, fill #0

## 2023-06-01 MED ORDER — FLUOXETINE HCL 20 MG PO CAPS
20.0000 mg | ORAL_CAPSULE | Freq: Every day | ORAL | 0 refills | Status: AC
  Filled 2023-06-01: qty 30, 30d supply, fill #0

## 2023-06-01 MED ORDER — ROSUVASTATIN CALCIUM 20 MG PO TABS
20.0000 mg | ORAL_TABLET | Freq: Every day | ORAL | 0 refills | Status: AC
  Filled 2023-06-01: qty 30, 30d supply, fill #0

## 2023-06-01 NOTE — ED Notes (Signed)
Pt walked to bathroom, pt steady on feet

## 2023-06-01 NOTE — ED Notes (Signed)
Doc at bedside.

## 2023-06-01 NOTE — ED Provider Notes (Signed)
[place] [facility]ERGENCY DEPARTMENT AT [facility] Provider Note   CSN: [id number] Arrival date & time: 06/01/23  2115     History  Chief Complaint  Patient presents with   Headache    Jerry Humphrey is a 62 y.o. male.  The history is provided by the patient, medical records and the EMS personnel. No language interpreter was used.  Headache    62 year old male with significant history of hypertension, alcohol abuse, alcohol-related seizure, prior stroke brought here via EMS from shelter with multiple complaints.  Patient states he is currently living in shelter.  He felt he is unable to care for himself appropriately.  He mention he ran out of all of his medication for "quite a while".  He does not have money to afford his medication and he lacks transportation.  He endorsed recurrent bouts of dizziness for "quite a while".  Also endorsed having multiple falls because he is unable to ambulate with a steady gait.  States today he was walking and tripped over the dog, fell striking his head against a cement table outside.  He denies any loss of consciousness.  He endorsed some mild tenderness to his forehead but denies any nausea or vomiting.  Did endorse having seen some spots in his vision temporarily but that has resolved.  He admits to consuming 2 alcohol beverages today.  He has been eating okay.  Denies any infectious symptoms.  Home Medications Prior to Admission medications   Medication Sig Start Date End Date Taking? Authorizing Provider  amLODipine (NORVASC) 10 MG tablet Take 1 tablet (10 mg total) by mouth daily. 11/24/21   Rancour, Jeannett Senior, MD  FLUoxetine (PROZAC) 20 MG capsule Take 1 capsule (20 mg total) by mouth daily. 11/24/21   Rancour, Jeannett Senior, MD  folic acid (FOLVITE) 1 MG tablet Take 1 tablet (1 mg total) by mouth daily. 08/26/21   Alfredo Martinez, MD  hydrochlorothiazide (HYDRODIURIL) 25 MG tablet Take 1 tablet (25 mg total) by mouth daily. 11/24/21   Rancour, Jeannett Senior,  MD  lidocaine (LIDODERM) 5 % Place 1 patch onto the skin daily. Remove & Discard patch within 12 hours or as directed by MD 02/11/22   Sabas Sous, MD  naproxen (NAPROSYN) 500 MG tablet Take 1 tablet (500 mg total) by mouth 2 (two) times daily. 02/11/22   Sabas Sous, MD  oxyCODONE (ROXICODONE) 5 MG immediate release tablet Take 1 tablet (5 mg total) by mouth every 4 (four) hours as needed for severe pain. 02/11/22   Sabas Sous, MD  rosuvastatin (CRESTOR) 20 MG tablet Take 1 tablet (20 mg total) by mouth daily. 11/24/21   Rancour, Jeannett Senior, MD  simethicone (MYLICON) 80 MG chewable tablet Chew 1 tablet (80 mg total) by mouth 4 (four) times daily as needed for flatulence. 09/01/21   Levin Erp, MD  thiamine 100 MG tablet Take 1 tablet (100 mg total) by mouth daily. 08/26/21   Alfredo Martinez, MD      Allergies    Codeine    Review of Systems   Review of Systems  Neurological:  Positive for headaches.  All other systems reviewed and are negative.   Physical Exam Updated Vital Signs BP (!) 160/98 (BP Location: Right Arm)   Pulse 88   Temp 98.6 F (37 C) (Oral)   Resp 18   SpO2 97%  Physical Exam Vitals and nursing note reviewed.  Constitutional:      General: He is not in acute distress.  Appearance: He is well-developed.  HENT:     Head: Normocephalic and atraumatic.     Comments: Mild tenderness to right forehead no bruising noted no battle signs or raccoon's eyes no midface tenderness. Eyes:     Conjunctiva/sclera: Conjunctivae normal.  Neck:     Comments: No cervical midline spine tenderness Cardiovascular:     Rate and Rhythm: Normal rate and regular rhythm.     Heart sounds: Normal heart sounds.  Pulmonary:     Effort: Pulmonary effort is normal.     Breath sounds: Normal breath sounds.  Chest:     Chest wall: No tenderness.  Abdominal:     Palpations: Abdomen is soft.     Tenderness: There is no abdominal tenderness.  Musculoskeletal:     Cervical back:  Normal range of motion and neck supple.     Comments: Equal strength throughout  Skin:    Findings: No rash.  Neurological:     Mental Status: He is alert. Mental status is at baseline.     Comments: Ambulate with a steady gait     ED Results / Procedures / Treatments   Labs (all labs ordered are listed, but only abnormal results are displayed) Labs Reviewed  ETHANOL - Abnormal; Notable for the following components:      Result Value   Alcohol, Ethyl (B) 67 (*)    All other components within normal limits  COMPREHENSIVE METABOLIC PANEL - Abnormal; Notable for the following components:   Sodium 134 (*)    CO2 19 (*)    Glucose, Bld 103 (*)    AST 103 (*)    ALT 93 (*)    All other components within normal limits  URINALYSIS, W/ REFLEX TO CULTURE (INFECTION SUSPECTED) - Abnormal; Notable for the following components:   Color, Urine STRAW (*)    Specific Gravity, Urine 1.001 (*)    All other components within normal limits  PROTIME-INR  APTT  CBC  DIFFERENTIAL  RAPID URINE DRUG SCREEN, HOSP PERFORMED  I-STAT CHEM 8, ED    EKG EKG Interpretation Date/Time:  Saturday June 01 2023 21:23:11 EST Ventricular Rate:  79 PR Interval:  170 QRS Duration:  72 QT Interval:  368 QTC Calculation: 421 R Axis:   2  Text Interpretation: Normal sinus rhythm Minimal voltage criteria for LVH, may be normal variant ( R in aVL ) ST elevation, consider early repolarization, pericarditis, or injury No significant change since last tracing When compared with ECG of 10-Nov-2022 14:20, PREVIOUS ECG IS PRESENT Confirmed by Gwyneth Sprout ([id number]) on 06/01/2023 10:22:12 PM  Radiology CT HEAD WO CONTRAST  Result Date: 06/01/2023 CLINICAL DATA:  Neuro deficit, acute, stroke suspected. Headache for 1 hour with dots in vision. Remote history of stroke. Nausea and dizziness. EXAM: CT HEAD WITHOUT CONTRAST TECHNIQUE: Contiguous axial images were obtained from the base of the skull through the  vertex without intravenous contrast. RADIATION DOSE REDUCTION: This exam was performed according to the departmental dose-optimization program which includes automated exposure control, adjustment of the mA and/or kV according to patient size and/or use of iterative reconstruction technique. COMPARISON:  02/14/2023. FINDINGS: Brain: No acute intracranial hemorrhage, midline shift or mass effect. No extra-axial fluid collection. Encephalomalacia is noted in the frontal lobe on the left, compatible with old infarct. Subcortical and periventricular white matter hypodensities are noted bilaterally. No hydrocephalus. Old lacunar infarcts are noted in the basal ganglia. Vascular: No hyperdense vessel or unexpected calcification. Skull: Normal. Negative for fracture or  focal lesion. Sinuses/Orbits: No acute finding. Other: None. IMPRESSION: 1. No acute intracranial process. 2. Atrophy with chronic microvascular ischemic changes and old infarct in the frontal lobe on the left. Electronically Signed   By: Thornell Sartorius M.D.   On: 06/01/2023 22:18    Procedures Procedures    Medications Ordered in ED Medications  acetaminophen (TYLENOL) tablet 1,000 mg (1,000 mg Oral Given 06/01/23 2235)    ED Course/ Medical Decision Making/ A&P                                 Medical Decision Making Amount and/or Complexity of Data Reviewed Labs: ordered. Radiology: ordered.  Risk OTC drugs.   BP (!) 140/100 (BP Location: Left Arm)   Pulse 80   Temp 98.7 F (37.1 C) (Oral)   Resp 16   SpO2 98%   25:79 PM  62 year old male with significant history of hypertension, alcohol abuse, alcohol-related seizure, prior stroke brought here via EMS from shelter with multiple complaints.  Patient states he is currently living in shelter.  He felt he is unable to care for himself appropriately.  He mention he ran out of all of his medication for "quite a while".  He does not have money to afford his medication and he lacks  transportation.  He endorsed recurrent bouts of dizziness for "quite a while".  Also endorsed having multiple falls because he is unable to ambulate with a steady gait.  States today he was walking and tripped over the dog, fell striking his head against a cement table outside.  He denies any loss of consciousness.  He endorsed some mild tenderness to his forehead but denies any nausea or vomiting.  Did endorse having seen some spots in his vision temporarily but that has resolved.  He admits to consuming 2 alcohol beverages today.  He has been eating okay.  Denies any infectious symptoms.  Exam overall reassuring mild tenderness to right forehead only but no obvious signs of significant trauma.  Has some global weakness noted on exam and patient does have some fatigable nystagmus but no appreciable neurodeficit appreciated.  -Labs ordered, independently viewed and interpreted by me.  Labs remarkable for alcohol 67, pt without any withdrawal sxs.  Mild transaminitis with AST 103, ALT 93 likely in the setting of chronic alcohol use. UDS negative. UA without infection -The patient was maintained on a cardiac monitor.  I personally viewed and interpreted the cardiac monitored which showed an underlying rhythm of: NSR -Imaging independently viewed and interpreted by me and I agree with radiologist's interpretation.  Result remarkable for head CT without acute finding -This patient presents to the ED for concern of fall, this involves an extensive number of treatment options, and is a complaint that carries with it a high risk of complications and morbidity.  The differential diagnosis includes mechanical fall, anemia, electrolytes derangement, stroke, mi, uti, seizure -Co morbidities that complicate the patient evaluation includes alcohol abuse, prior stroke, seizure,  -Treatment includes tylenol -Reevaluation of the patient after these medicines showed that the patient improved -PCP office notes or outside  notes reviewed -Escalation to admission/observation considered: patients feels much better, is comfortable with discharge, and will follow up with PCP -Prescription medication considered, patient comfortable with refill of his home meds -Social Determinant of Health considered which includes tobacco use, financial constraint, lack of transportation  Patient mention he does not have access to any of  his medication as he has not had his medication refilled for quite some time due to lack of transportation and financial constraint.  I have all of his medication, I will refill his antihypertensive medication which include amlodipine 10 mg p.o. daily, and will refill rosuvastatin 20 mg p.o. daily, as well as hydrochlorothiazide 25 mg daily and fluoxetine 20 mg p.o. daily.  I have also request for our social worker to reach out to patient to assist further with social support.  At this time patient is stable for discharge.        Final Clinical Impression(s) / ED Diagnoses Final diagnoses:  Fall from standing, initial encounter  Minor head injury, initial encounter  Medication refill    Rx / DC Orders ED Discharge Orders          Ordered    amLODipine (NORVASC) 10 MG tablet  Daily        06/01/23 2248    FLUoxetine (PROZAC) 20 MG capsule  Daily        06/01/23 2248    hydrochlorothiazide (HYDRODIURIL) 25 MG tablet  Daily        06/01/23 2248    rosuvastatin (CRESTOR) 20 MG tablet  Daily        06/01/23 2248              Fayrene Helper, PA-C 06/01/23 2253    Glynn Octave, MD 06/02/23 [phone]

## 2023-06-01 NOTE — Discharge Instructions (Addendum)
You have been evaluated for your symptoms.  Fortunately CT scan of the head did not show any significant injury.  Blood work overall reassuring.  I have refilled your medication.  I have also requested for our social worker to reach out to you sometime in the next few days to see if we can provide you any additional social support or assistance.  Please use resource below as needed.

## 2023-06-01 NOTE — ED Triage Notes (Signed)
Pt has been having a headache x1hr, states she gets little dots in his vision with the headache. States having a stroke a few years back and have deficits with that. He does have some dizziness and nausea, he states he has had 2 drinks tonight as well.   Medic vitals  150/100 76hr 97%ra 95bgl

## 2023-06-01 NOTE — ED Notes (Signed)
PTS BROTHER IS GOING TO PICK PT UP FOR THE LOBBY.

## 2023-06-03 ENCOUNTER — Other Ambulatory Visit (HOSPITAL_COMMUNITY): Payer: Self-pay

## 2023-06-07 ENCOUNTER — Other Ambulatory Visit (HOSPITAL_COMMUNITY): Payer: Self-pay

## 2023-06-12 ENCOUNTER — Other Ambulatory Visit (HOSPITAL_COMMUNITY): Payer: Self-pay

## 2023-06-13 ENCOUNTER — Other Ambulatory Visit (HOSPITAL_COMMUNITY): Payer: Self-pay

## 2023-09-24 ENCOUNTER — Emergency Department (HOSPITAL_COMMUNITY): Admission: EM | Admit: 2023-09-24 | Discharge: 2023-09-24 | Discharge status: Against Medical Advice | Payer: MEDICAID

## 2023-09-24 ENCOUNTER — Other Ambulatory Visit: Payer: Self-pay

## 2023-09-24 ENCOUNTER — Encounter (HOSPITAL_COMMUNITY): Payer: Self-pay

## 2023-09-24 ENCOUNTER — Emergency Department (HOSPITAL_COMMUNITY): Payer: MEDICAID

## 2023-09-24 DIAGNOSIS — R1012 Left upper quadrant pain: Secondary | ICD-10-CM | POA: Insufficient documentation

## 2023-09-24 DIAGNOSIS — R0789 Other chest pain: Secondary | ICD-10-CM | POA: Diagnosis present

## 2023-09-24 DIAGNOSIS — K921 Melena: Secondary | ICD-10-CM | POA: Diagnosis not present

## 2023-09-24 DIAGNOSIS — W010XXA Fall on same level from slipping, tripping and stumbling without subsequent striking against object, initial encounter: Secondary | ICD-10-CM | POA: Diagnosis not present

## 2023-09-24 DIAGNOSIS — Z5321 Procedure and treatment not carried out due to patient leaving prior to being seen by health care provider: Secondary | ICD-10-CM | POA: Insufficient documentation

## 2023-09-24 LAB — CBC
HCT: 43.5 % (ref 39.0–52.0)
Hemoglobin: 14.3 g/dL (ref 13.0–17.0)
MCH: 28.1 pg (ref 26.0–34.0)
MCHC: 32.9 g/dL (ref 30.0–36.0)
MCV: 85.5 fL (ref 80.0–100.0)
Platelets: 286 10*3/uL (ref 150–400)
RBC: 5.09 MIL/uL (ref 4.22–5.81)
RDW: 12.9 % (ref 11.5–15.5)
WBC: 8.2 10*3/uL (ref 4.0–10.5)
nRBC: 0 % (ref 0.0–0.2)

## 2023-09-24 NOTE — ED Notes (Signed)
 Pt called for labs x2 with no answer

## 2023-09-24 NOTE — ED Notes (Signed)
 3rd call for vitals, no answer.

## 2023-09-24 NOTE — ED Provider Triage Note (Signed)
 Emergency Medicine Provider Triage Evaluation Note  Jerry Humphrey , a 63 y.o. male  was evaluated in triage.  Pt complains of left-sided chest wall pain after a mechanical fall.  The patient states that he fell on his left side earlier today.  He tripped and fell.  He states that he has been having a lot of left upper quadrant pain since then.  He states that he had a bowel movement afterwards and noticed some blood in his stool.  He denies any lightheadedness or presyncope.  Denies vomiting any blood.  Review of Systems  Positive: See above Negative: See above  Physical Exam  BP (!) 133/95   Pulse 93   Temp 98.2 F (36.8 C) (Oral)   Resp 18   Ht 5\' 8"  (1.727 m)   Wt 77.6 kg   SpO2 100%   BMI 26.00 kg/m  Gen:   Awake, no distress   Resp:  Normal effort, tender over left side of chest wall with no crepitus and also over left upper quadrant of abdomen MSK:   Moves extremities without difficulty   Medical Decision Making  Medically screening exam initiated at 6:26 PM.  Appropriate orders placed.  Jerry Humphrey was informed that the remainder of the evaluation will be completed by another provider, this initial triage assessment does not replace that evaluation, and the importance of remaining in the ED until their evaluation is complete.     Durwin Glaze, MD 09/24/23 [phone]

## 2023-09-24 NOTE — ED Triage Notes (Signed)
 Pt to ED via [facility] from home c/o mechanical fall down a few steps. Reports tripped over his feet. Pt landed on left side and now c/o left side pain and left arm. Pt also c/o rectal bleeding after fall. Denies dizziness. Orientation at baseline.   No medications given by EMS.  Last VS: 120 PALP, 97%RA, HR 88

## 2023-09-24 NOTE — ED Notes (Signed)
 Attempted to update vitals; no answer x 2

## 2023-09-30 DIAGNOSIS — R079 Chest pain, unspecified: Secondary | ICD-10-CM | POA: Diagnosis not present

## 2023-11-21 ENCOUNTER — Encounter (HOSPITAL_COMMUNITY): Payer: Self-pay

## 2023-11-21 ENCOUNTER — Emergency Department (HOSPITAL_COMMUNITY): Payer: MEDICAID

## 2023-11-21 ENCOUNTER — Other Ambulatory Visit: Payer: Self-pay

## 2023-11-21 ENCOUNTER — Emergency Department (HOSPITAL_COMMUNITY)
Admission: EM | Admit: 2023-11-21 | Discharge: 2023-11-21 | Disposition: A | Discharge status: Home / Self Care | Payer: MEDICAID | Attending: Emergency Medicine | Admitting: Emergency Medicine

## 2023-11-21 DIAGNOSIS — I10 Essential (primary) hypertension: Secondary | ICD-10-CM | POA: Insufficient documentation

## 2023-11-21 DIAGNOSIS — R011 Cardiac murmur, unspecified: Secondary | ICD-10-CM | POA: Insufficient documentation

## 2023-11-21 DIAGNOSIS — M79675 Pain in left toe(s): Secondary | ICD-10-CM | POA: Insufficient documentation

## 2023-11-21 DIAGNOSIS — Z733 Stress, not elsewhere classified: Secondary | ICD-10-CM | POA: Diagnosis not present

## 2023-11-21 DIAGNOSIS — M25512 Pain in left shoulder: Secondary | ICD-10-CM | POA: Diagnosis not present

## 2023-11-21 DIAGNOSIS — R7989 Other specified abnormal findings of blood chemistry: Secondary | ICD-10-CM | POA: Diagnosis not present

## 2023-11-21 DIAGNOSIS — W228XXA Striking against or struck by other objects, initial encounter: Secondary | ICD-10-CM | POA: Diagnosis not present

## 2023-11-21 DIAGNOSIS — Z5900 Homelessness unspecified: Secondary | ICD-10-CM | POA: Insufficient documentation

## 2023-11-21 DIAGNOSIS — M25511 Pain in right shoulder: Secondary | ICD-10-CM | POA: Insufficient documentation

## 2023-11-21 DIAGNOSIS — R42 Dizziness and giddiness: Secondary | ICD-10-CM | POA: Insufficient documentation

## 2023-11-21 DIAGNOSIS — Z79899 Other long term (current) drug therapy: Secondary | ICD-10-CM | POA: Diagnosis not present

## 2023-11-21 DIAGNOSIS — M7918 Myalgia, other site: Secondary | ICD-10-CM

## 2023-11-21 HISTORY — DX: Bipolar disorder, unspecified: F31.9

## 2023-11-21 LAB — CBC WITH DIFFERENTIAL/PLATELET
Abs Immature Granulocytes: 0.03 10*3/uL (ref 0.00–0.07)
Basophils Absolute: 0.1 10*3/uL (ref 0.0–0.1)
Basophils Relative: 1 %
Eosinophils Absolute: 0.2 10*3/uL (ref 0.0–0.5)
Eosinophils Relative: 2 %
HCT: 35.2 % — ABNORMAL LOW (ref 39.0–52.0)
Hemoglobin: 11.9 g/dL — ABNORMAL LOW (ref 13.0–17.0)
Immature Granulocytes: 0 %
Lymphocytes Relative: 25 %
Lymphs Abs: 2 10*3/uL (ref 0.7–4.0)
MCH: 28.3 pg (ref 26.0–34.0)
MCHC: 33.8 g/dL (ref 30.0–36.0)
MCV: 83.8 fL (ref 80.0–100.0)
Monocytes Absolute: 1.2 10*3/uL — ABNORMAL HIGH (ref 0.1–1.0)
Monocytes Relative: 14 %
Neutro Abs: 4.6 10*3/uL (ref 1.7–7.7)
Neutrophils Relative %: 58 %
Platelets: 259 10*3/uL (ref 150–400)
RBC: 4.2 MIL/uL — ABNORMAL LOW (ref 4.22–5.81)
RDW: 13.3 % (ref 11.5–15.5)
WBC: 8 10*3/uL (ref 4.0–10.5)
nRBC: 0 % (ref 0.0–0.2)

## 2023-11-21 LAB — COMPREHENSIVE METABOLIC PANEL WITH GFR
ALT: 61 U/L — ABNORMAL HIGH (ref 0–44)
AST: 86 U/L — ABNORMAL HIGH (ref 15–41)
Albumin: 3.9 g/dL (ref 3.5–5.0)
Alkaline Phosphatase: 50 U/L (ref 38–126)
Anion gap: 9 (ref 5–15)
BUN: 25 mg/dL — ABNORMAL HIGH (ref 8–23)
CO2: 23 mmol/L (ref 22–32)
Calcium: 8.9 mg/dL (ref 8.9–10.3)
Chloride: 100 mmol/L (ref 98–111)
Creatinine, Ser: 1.32 mg/dL — ABNORMAL HIGH (ref 0.61–1.24)
GFR, Estimated: >60 mL/min (ref >60)
Glucose, Bld: 121 mg/dL — ABNORMAL HIGH (ref 70–99)
Potassium: 3.7 mmol/L (ref 3.5–5.1)
Sodium: 132 mmol/L — ABNORMAL LOW (ref 135–145)
Total Bilirubin: 0.8 mg/dL (ref 0.0–1.2)
Total Protein: 7.6 g/dL (ref 6.5–8.1)

## 2023-11-21 LAB — ETHANOL: Alcohol, Ethyl (B): <15 mg/dL (ref <15)

## 2023-11-21 LAB — TROPONIN I (HIGH SENSITIVITY): Troponin I (High Sensitivity): 7 ng/L (ref <18)

## 2023-11-21 MED ORDER — ACETAMINOPHEN 500 MG PO TABS
1000.0000 mg | ORAL_TABLET | Freq: Once | ORAL | Status: AC
  Administered 2023-11-21: 1000 mg via ORAL
  Filled 2023-11-21: qty 2

## 2023-11-21 MED ORDER — SODIUM CHLORIDE 0.9 % IV BOLUS
1000.0000 mL | Freq: Once | INTRAVENOUS | Status: AC
  Administered 2023-11-21: 1000 mL via INTRAVENOUS

## 2023-11-21 NOTE — Discharge Instructions (Addendum)
 The pain in your upper shoulders is likely due to muscle pain from the backpack you are wearing.   You may take up to 1000mg  of tylenol  every 6 hours as needed for pain.  Do not take more then 4g per day.  You may apply heating packs to the upper shoulders to help with pain.  You had an elevated creatinine today which is a measure of your kidney function.  This means your kidneys are not filtering as well as they should be. This is likely because you are dehydrated.  You were given fluids here for dehydration.  Please keep well-hydrated at home with plenty of water.  Your hemoglobin which is when your blood counts is slightly low here today.  Your AST and ALT which are some liver enzymes are also slightly elevated. Please have your PCP recheck this value within the next 2 weeks.  If you have any mental health concerns, please go to the behavioral Health Center listed below ([phone] [place] ).  They are open 24/7 and are available without an appointment. You can walk in anytime for an evaluation.   Your chest x-ray, heart enzyme (troponin), and EKG (measure of the heart's rhythm and electrical activity) was normal today.  Return to the ER for any chest pain, shortness of breath, any other new or concerning symptoms.

## 2023-11-21 NOTE — ED Provider Notes (Signed)
 [place] [facility] AT [facility] Provider Note   CSN: [id number] Arrival date & time: 11/21/23  1320     History  Chief Complaint  Patient presents with   Psychiatric Evaluation    Jerry Humphrey is a 63 y.o. male with history of alcohol abuse, alcohol induced seizure, hypertension, CVA, states he came to the ER for "stress".  States he has been concerned about his financial situation and work.  Reports he walked 30 miles a couple days ago with a heavy backpack on his back, and is now having some pain in his shoulders bilaterally from where the backpack was sitting.  Denies any pain in the center of his chest or any shortness of breath.  Pain in his shoulders worsens with movement or touching his shoulders.  Pain is nonexertional, nonpleuritic.  Also reports pain in his left toes, states that his toes are digging into each other.  EMS reported that the patient was feeling dizzy, but patient denies any dizziness or double vision.  Denies any weakness in his upper or lower extremities.   EMS report concern that he may need psychiatric evaluation.  However, patient denies any suicidal or homicidal ideation.  Denying any auditory or visual hallucinations.  He did states he is very stressed by his current situation.  HPI     Home Medications Prior to Admission medications   Medication Sig Start Date End Date Taking? Authorizing Provider  amLODipine  (NORVASC ) 10 MG tablet Take 1 tablet (10 mg total) by mouth daily. 06/01/23   Debbra Fairy, PA-C  FLUoxetine  (PROZAC ) 20 MG capsule Take 1 capsule (20 mg total) by mouth daily. 06/01/23   Debbra Fairy, PA-C  folic acid  (FOLVITE ) 1 MG tablet Take 1 tablet (1 mg total) by mouth daily. 08/26/21   Ernestina Headland, MD  hydrochlorothiazide  (HYDRODIURIL ) 25 MG tablet Take 1 tablet (25 mg total) by mouth daily. 06/01/23   Debbra Fairy, PA-C  lidocaine  (LIDODERM ) 5 % Place 1 patch onto the skin daily. Remove & Discard patch within 12 hours  or as directed by MD 02/11/22   Edson Graces, MD  naproxen  (NAPROSYN ) 500 MG tablet Take 1 tablet (500 mg total) by mouth 2 (two) times daily. 02/11/22   Bero, Michael M, MD  oxyCODONE  (ROXICODONE ) 5 MG immediate release tablet Take 1 tablet (5 mg total) by mouth every 4 (four) hours as needed for severe pain. 02/11/22   Bero, Michael M, MD  rosuvastatin  (CRESTOR ) 20 MG tablet Take 1 tablet (20 mg total) by mouth daily. 06/01/23   Debbra Fairy, PA-C  simethicone  (MYLICON) 80 MG chewable tablet Chew 1 tablet (80 mg total) by mouth 4 (four) times daily as needed for flatulence. 09/01/21   Genora Kidd, MD  thiamine  100 MG tablet Take 1 tablet (100 mg total) by mouth daily. 08/26/21   Ernestina Headland, MD      Allergies    Codeine    Review of Systems   Review of Systems  Cardiovascular:  Negative for chest pain.  Gastrointestinal:  Negative for abdominal pain.  Psychiatric/Behavioral:  Negative for hallucinations and suicidal ideas.     Physical Exam Updated Vital Signs BP 134/76   Pulse 94   Temp 98.5 F (36.9 C) (Oral)   Resp 17   Ht 5\' 7"  (1.702 m)   Wt 78 kg   SpO2 96%   BMI 26.93 kg/m  Physical Exam Vitals and nursing note reviewed.  Constitutional:  General: He is not in acute distress.    Appearance: He is well-developed.  HENT:     Head: Normocephalic and atraumatic.  Eyes:     Extraocular Movements: Extraocular movements intact.     Conjunctiva/sclera: Conjunctivae normal.     Pupils: Pupils are equal, round, and reactive to light.  Cardiovascular:     Rate and Rhythm: Normal rate and regular rhythm.     Heart sounds: Murmur heard.     Comments: 2+ radial pulse and 2+ dorsalis pedis pulse bilaterally Pulmonary:     Effort: Pulmonary effort is normal. No respiratory distress.     Breath sounds: Normal breath sounds.     Comments: Talking in full sentences on room air without difficulty Abdominal:     Palpations: Abdomen is soft.     Tenderness: There is no  abdominal tenderness.  Musculoskeletal:        General: No swelling.       Arms:     Cervical back: Neck supple.     Right lower leg: No edema.     Left lower leg: No edema.     Comments: Tender to palpation of the musculature of the upper lack, left more than right.  Tender to palpation of the left anterior shoulder. No cervical, thoracic or lumbar spinal tenderness to palpation  Able to fully flex, extend, abduct, adduct, internally rotate, and externally rotate the shoulders bilaterally.  Pain in the shoulders reproduced with range of motion  Ambulates without difficulty  Left foot with very thick toenails that seem to be hitting other toes. No abrasions or wounds  Skin:    General: Skin is warm and dry.     Capillary Refill: Capillary refill takes less than 2 seconds.  Neurological:     Mental Status: He is alert.     Comments: Intact sensation of the RUE and RLE, decreased sensation of the LUE and LLE due to previous stroke.  Patient reports he change in sensation.  5/5 strength of the bilateral extremities  Psychiatric:        Mood and Affect: Mood normal.     ED Results / Procedures / Treatments   Labs (all labs ordered are listed, but only abnormal results are displayed) Labs Reviewed  COMPREHENSIVE METABOLIC PANEL WITH GFR - Abnormal; Notable for the following components:      Result Value   Sodium 132 (*)    Glucose, Bld 121 (*)    BUN 25 (*)    Creatinine, Ser 1.32 (*)    AST 86 (*)    ALT 61 (*)    All other components within normal limits  CBC WITH DIFFERENTIAL/PLATELET - Abnormal; Notable for the following components:   RBC 4.20 (*)    Hemoglobin 11.9 (*)    HCT 35.2 (*)    Monocytes Absolute 1.2 (*)    All other components within normal limits  ETHANOL  TROPONIN I (HIGH SENSITIVITY)    EKG None  Radiology DG Chest 2 View Result Date: 11/21/2023 CLINICAL DATA:  Chest pain. EXAM: CHEST - 2 VIEW COMPARISON:  09/29/2023. FINDINGS: Bilateral lung  fields are clear. Bilateral costophrenic angles are clear. Normal cardio-mediastinal silhouette. No acute osseous abnormalities. The soft tissues are within normal limits. IMPRESSION: No active cardiopulmonary disease. Electronically Signed   By: Beula Brunswick M.D.   On: 11/21/2023 15:04    Procedures Procedures    Medications Ordered in ED Medications  acetaminophen  (TYLENOL ) tablet 1,000 mg (1,000 mg Oral  Given 11/21/23 1602)  sodium chloride  0.9 % bolus 1,000 mL (0 mLs Intravenous Stopped 11/21/23 1714)    ED Course/ Medical Decision Making/ A&P Clinical Course as of 11/21/23 1724  Thu Nov 21, 2023  1505 Appears patient has mild AKI and hyponatremia, will give NS bolus [AF]    Clinical Course User Index [AF] Rexie Catena, PA-C                                 Medical Decision Making Amount and/or Complexity of Data Reviewed Labs: ordered. Radiology: ordered.  Risk OTC drugs.     Differential diagnosis includes but is not limited to musculoskeletal pain, muscle strain, pneumonia, URI, SI, HI, mental health concern  ED Course:  Upon initial evaluation, patient is well-appearing, stable vital signs.  He is reporting pain in his upper shoulders bilaterally from where he had his backpack on while walking.  He is tender to palpation in the musculature of the upper back where the straps of his backpack would be laying.  Denies any pain in the center of his chest or any shortness of breath.  EMS reported that he may need a psychiatric evaluation, however, he denies any suicidal or homicidal ideation repeatedly when asked multiple different times.  Denies any auditory or visual hallucinations.  Reports he is very stressed about his financial and work situation.  Labs Ordered: I Ordered, and personally interpreted labs.  The pertinent results include:   CBC with no leukocytosis. Low hemoglobin at 11.9 Ethanol level undetectable Troponin 7 CMP with hyponatremia at 132, elevated  creatinine at 1.32.  No elevations in AST and ALT at 86 and 61 respectively  Imaging Studies ordered: I ordered imaging studies including chest x-ray  I independently visualized the imaging with scope of interpretation limited to determining acute life threatening conditions related to emergency care. Imaging showed no acute abnormality I agree with the radiologist interpretation   Cardiac Monitoring: / EKG: The patient was maintained on a cardiac monitor.  I personally viewed and interpreted the cardiac monitored which showed an underlying rhythm of: Normal sinus rhythm, no ST changes  Medications Given: Tylenol  for pain  Upon re-evaluation, patient still remains well-appearing, stable vital signs.  Still denies any SI or HI upon repeated questioning. No indication for emergent psychiatric hospitalization or further TTS consult at this time.  Low concern for ACS at this time given troponin remains stable with troponin of 7, pain non-exertional and reproducible with palpation of upper shoulders, and EKG with normal sinus rhythm and no ST changes. Chest x-ray without any acute abnormality. Very low concern for PE given no chest pain, shortness of breath, lower extremity swelling, or vital sign abnormalities.  His pain in his upper shoulders is consistent with musculoskeletal pain as it is reproducible with palpation of this area.  This seems due to the backpack he was wearing.  Given he had a elevated creatinine on CMP, this is consistent with mild AKI.  He was given NS bolus.  Will have him continue with oral hydration at home as he is tolerating PO intake. No indication for hospitalization at this time. Stable and appropriate for discharge home.     Impression: Musculoskeletal upper back pain Elevated creatinine Homelessness  Disposition:  The patient was discharged home with instructions to keep well-hydrated at home with water.  Follow-up with PCP in the next 2 weeks to repeat CBC and  CMP to  follow-up on his slightly low hemoglobin and mildly elevated AST and ALT.  [facility] information was provided, discussed that he can go here 24/7 without appointment.  He understands he can walk-in at any time for mental health concerns. I did ask social work to provide patient with resources regarding his homeless situation as he is reporting significant stress regarding his situation. Return precautions given.    This chart was dictated using voice recognition software, Dragon. Despite the best efforts of this provider to proofread and correct errors, errors may still occur which can change documentation meaning.          Final Clinical Impression(s) / ED Diagnoses Final diagnoses:  Elevated serum creatinine  Musculoskeletal pain  Homelessness    Rx / DC Orders ED Discharge Orders     None         Rexie Catena, PA-C 11/21/23 1725    Dorenda Gandy, MD 11/22/23 1537
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
## 2023-12-28 ENCOUNTER — Encounter (HOSPITAL_COMMUNITY): Payer: Self-pay

## 2023-12-28 ENCOUNTER — Other Ambulatory Visit: Payer: Self-pay

## 2023-12-28 ENCOUNTER — Emergency Department (HOSPITAL_COMMUNITY): Payer: MEDICAID

## 2023-12-28 ENCOUNTER — Emergency Department (HOSPITAL_COMMUNITY)
Admission: EM | Admit: 2023-12-28 | Discharge: 2023-12-29 | Disposition: A | Discharge status: Home / Self Care | Payer: MEDICAID | Attending: Emergency Medicine | Admitting: Emergency Medicine

## 2023-12-28 DIAGNOSIS — R079 Chest pain, unspecified: Secondary | ICD-10-CM

## 2023-12-28 DIAGNOSIS — R0789 Other chest pain: Secondary | ICD-10-CM | POA: Insufficient documentation

## 2023-12-28 DIAGNOSIS — Z79899 Other long term (current) drug therapy: Secondary | ICD-10-CM | POA: Diagnosis not present

## 2023-12-28 DIAGNOSIS — E119 Type 2 diabetes mellitus without complications: Secondary | ICD-10-CM | POA: Diagnosis not present

## 2023-12-28 DIAGNOSIS — Z5900 Homelessness unspecified: Secondary | ICD-10-CM | POA: Diagnosis not present

## 2023-12-28 DIAGNOSIS — I10 Essential (primary) hypertension: Secondary | ICD-10-CM | POA: Insufficient documentation

## 2023-12-28 LAB — ETHANOL: Alcohol, Ethyl (B): <15 mg/dL (ref <15)

## 2023-12-28 LAB — HEPATIC FUNCTION PANEL
ALT: 57 U/L — ABNORMAL HIGH (ref 0–44)
AST: 61 U/L — ABNORMAL HIGH (ref 15–41)
Albumin: 3.7 g/dL (ref 3.5–5.0)
Alkaline Phosphatase: 53 U/L (ref 38–126)
Bilirubin, Direct: 0.1 mg/dL (ref 0.0–0.2)
Indirect Bilirubin: 0.8 mg/dL (ref 0.3–0.9)
Total Bilirubin: 0.9 mg/dL (ref 0.0–1.2)
Total Protein: 7.7 g/dL (ref 6.5–8.1)

## 2023-12-28 LAB — CBC
HCT: 37.4 % — ABNORMAL LOW (ref 39.0–52.0)
Hemoglobin: 12.7 g/dL — ABNORMAL LOW (ref 13.0–17.0)
MCH: 28.6 pg (ref 26.0–34.0)
MCHC: 34 g/dL (ref 30.0–36.0)
MCV: 84.2 fL (ref 80.0–100.0)
Platelets: 223 10*3/uL (ref 150–400)
RBC: 4.44 MIL/uL (ref 4.22–5.81)
RDW: 13.3 % (ref 11.5–15.5)
WBC: 8.8 10*3/uL (ref 4.0–10.5)
nRBC: 0 % (ref 0.0–0.2)

## 2023-12-28 LAB — BASIC METABOLIC PANEL WITH GFR
Anion gap: 12 (ref 5–15)
BUN: 19 mg/dL (ref 8–23)
CO2: 23 mmol/L (ref 22–32)
Calcium: 9.2 mg/dL (ref 8.9–10.3)
Chloride: 103 mmol/L (ref 98–111)
Creatinine, Ser: 1.38 mg/dL — ABNORMAL HIGH (ref 0.61–1.24)
GFR, Estimated: 57 mL/min — ABNORMAL LOW (ref >60)
Glucose, Bld: 121 mg/dL — ABNORMAL HIGH (ref 70–99)
Potassium: 3.7 mmol/L (ref 3.5–5.1)
Sodium: 138 mmol/L (ref 135–145)

## 2023-12-28 LAB — LIPASE, BLOOD: Lipase: 34 U/L (ref 11–51)

## 2023-12-28 LAB — ACETAMINOPHEN LEVEL: Acetaminophen (Tylenol), Serum: <10 ug/mL — ABNORMAL LOW (ref 10–30)

## 2023-12-28 LAB — TROPONIN I (HIGH SENSITIVITY): Troponin I (High Sensitivity): 6 ng/L (ref <18)

## 2023-12-28 LAB — SALICYLATE LEVEL: Salicylate Lvl: <7 mg/dL — ABNORMAL LOW (ref 7.0–30.0)

## 2023-12-28 MED ORDER — SODIUM CHLORIDE 0.9 % IV BOLUS
1000.0000 mL | Freq: Once | INTRAVENOUS | Status: AC
  Administered 2023-12-28: 1000 mL via INTRAVENOUS
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
## 2023-12-29 LAB — CK: Total CK: 218 U/L (ref 49–397)

## 2023-12-29 LAB — TROPONIN I (HIGH SENSITIVITY): Troponin I (High Sensitivity): 8 ng/L (ref <18)

## 2023-12-29 NOTE — ED Provider Notes (Signed)
 Repeat trop and CK are reassuring.  Patient appears stable for discharge.   Sherel Dikes, PA-C 12/29/23 0158    Eve Hinders, MD 12/29/23 [phone]
